# Patient Record
Sex: Male | Born: 1955 | Race: White | Hispanic: No | Marital: Single | Smoking: Current every day smoker
Health system: Southern US, Community
[De-identification: ages and names within clinical notes are randomized; demographics above are authoritative.]

## PROBLEM LIST (undated history)

## (undated) DIAGNOSIS — I10 Essential (primary) hypertension: Secondary | ICD-10-CM

## (undated) DIAGNOSIS — E119 Type 2 diabetes mellitus without complications: Secondary | ICD-10-CM

## (undated) DIAGNOSIS — T148XXA Other injury of unspecified body region, initial encounter: Secondary | ICD-10-CM

## (undated) DIAGNOSIS — L089 Local infection of the skin and subcutaneous tissue, unspecified: Secondary | ICD-10-CM

## (undated) DIAGNOSIS — Z720 Tobacco use: Secondary | ICD-10-CM

## (undated) DIAGNOSIS — A159 Respiratory tuberculosis unspecified: Secondary | ICD-10-CM

## (undated) DIAGNOSIS — E7800 Pure hypercholesterolemia, unspecified: Secondary | ICD-10-CM

## (undated) DIAGNOSIS — M1990 Unspecified osteoarthritis, unspecified site: Secondary | ICD-10-CM

## (undated) DIAGNOSIS — L03116 Cellulitis of left lower limb: Secondary | ICD-10-CM

## (undated) DIAGNOSIS — M869 Osteomyelitis, unspecified: Secondary | ICD-10-CM

---

## 2002-09-09 ENCOUNTER — Encounter: Payer: Self-pay | Admitting: Emergency Medicine

## 2002-09-09 ENCOUNTER — Inpatient Hospital Stay (HOSPITAL_COMMUNITY): Admission: EM | Admit: 2002-09-09 | Discharge: 2002-09-11 | Payer: Self-pay | Admitting: Emergency Medicine

## 2013-06-10 ENCOUNTER — Encounter (HOSPITAL_COMMUNITY): Payer: Self-pay | Admitting: *Deleted

## 2013-06-10 ENCOUNTER — Emergency Department (HOSPITAL_COMMUNITY)
Admission: EM | Admit: 2013-06-10 | Discharge: 2013-06-10 | Disposition: A | Discharge status: Home / Self Care | Payer: Self-pay | Attending: Emergency Medicine | Admitting: Emergency Medicine

## 2013-06-10 ENCOUNTER — Emergency Department (HOSPITAL_COMMUNITY): Payer: Self-pay

## 2013-06-10 DIAGNOSIS — F17200 Nicotine dependence, unspecified, uncomplicated: Secondary | ICD-10-CM | POA: Insufficient documentation

## 2013-06-10 DIAGNOSIS — R202 Paresthesia of skin: Secondary | ICD-10-CM

## 2013-06-10 DIAGNOSIS — R739 Hyperglycemia, unspecified: Secondary | ICD-10-CM

## 2013-06-10 DIAGNOSIS — R7309 Other abnormal glucose: Secondary | ICD-10-CM | POA: Insufficient documentation

## 2013-06-10 DIAGNOSIS — R7401 Elevation of levels of liver transaminase levels: Secondary | ICD-10-CM | POA: Insufficient documentation

## 2013-06-10 DIAGNOSIS — R209 Unspecified disturbances of skin sensation: Secondary | ICD-10-CM | POA: Insufficient documentation

## 2013-06-10 DIAGNOSIS — R7402 Elevation of levels of lactic acid dehydrogenase (LDH): Secondary | ICD-10-CM | POA: Insufficient documentation

## 2013-06-10 LAB — URINE MICROSCOPIC-ADD ON

## 2013-06-10 LAB — CBC WITH DIFFERENTIAL/PLATELET
Basophils Absolute: 0.1 10*3/uL (ref 0.0–0.1)
Basophils Relative: 1 % (ref 0–1)
Eosinophils Absolute: 0.1 10*3/uL (ref 0.0–0.7)
Eosinophils Relative: 1 % (ref 0–5)
HCT: 40.9 % (ref 39.0–52.0)
Hemoglobin: 14.4 g/dL (ref 13.0–17.0)
Lymphocytes Relative: 28 % (ref 12–46)
Lymphs Abs: 2.9 10*3/uL (ref 0.7–4.0)
MCH: 33.4 pg (ref 26.0–34.0)
MCHC: 35.2 g/dL (ref 30.0–36.0)
MCV: 94.9 fL (ref 78.0–100.0)
Monocytes Absolute: 0.9 10*3/uL (ref 0.1–1.0)
Monocytes Relative: 9 % (ref 3–12)
Neutro Abs: 6.3 10*3/uL (ref 1.7–7.7)
Neutrophils Relative %: 61 % (ref 43–77)
Platelets: 268 10*3/uL (ref 150–400)
RBC: 4.31 MIL/uL (ref 4.22–5.81)
RDW: 13.5 % (ref 11.5–15.5)
WBC: 10.4 10*3/uL (ref 4.0–10.5)

## 2013-06-10 LAB — COMPREHENSIVE METABOLIC PANEL
ALT: 44 U/L (ref 0–53)
AST: 142 U/L — ABNORMAL HIGH (ref 0–37)
Albumin: 3.5 g/dL (ref 3.5–5.2)
Alkaline Phosphatase: 72 U/L (ref 39–117)
BUN: 15 mg/dL (ref 6–23)
CO2: 25 mEq/L (ref 19–32)
Calcium: 9 mg/dL (ref 8.4–10.5)
Chloride: 101 mEq/L (ref 96–112)
Creatinine, Ser: 1.07 mg/dL (ref 0.50–1.35)
GFR calc Af Amer: 88 mL/min — ABNORMAL LOW (ref >90)
GFR calc non Af Amer: 76 mL/min — ABNORMAL LOW (ref >90)
Glucose, Bld: 156 mg/dL — ABNORMAL HIGH (ref 70–99)
Potassium: 3.5 mEq/L (ref 3.5–5.1)
Sodium: 138 mEq/L (ref 135–145)
Total Bilirubin: 0.3 mg/dL (ref 0.3–1.2)
Total Protein: 6.4 g/dL (ref 6.0–8.3)

## 2013-06-10 LAB — LIPASE, BLOOD: Lipase: 25 U/L (ref 11–59)

## 2013-06-10 LAB — URINALYSIS, ROUTINE W REFLEX MICROSCOPIC
Glucose, UA: NEGATIVE mg/dL
Ketones, ur: 15 mg/dL — AB
Nitrite: NEGATIVE
Protein, ur: NEGATIVE mg/dL

## 2013-06-10 LAB — POCT I-STAT TROPONIN I: Troponin i, poc: 0.01 ng/mL (ref 0.00–0.08)

## 2013-06-10 NOTE — ED Provider Notes (Signed)
CSN: [id number]     Arrival date & time 06/10/13  0011 History     First MD Initiated Contact with Patient 06/10/13 0244     Chief Complaint  Patient presents with  . Numbness   (Consider location/radiation/quality/duration/timing/severity/associated sxs/prior Treatment) The history is provided by the patient.  57 year-old male has had intermittent numbness of his left hand for the last 2 weeks. He is also did notice some numbness of both of his feet. He has not noticed any weakness. He denies hurting anywhere. He denies chest pain, heaviness, tightness, pressure. He denies neck pain but he states that his neck sometimes feels tight. There's been no nausea, vomiting, diarrhea. Nothing makes the numbness better nothing makes it worse. He has been having some mild, intermittent right upper quadrant pain for the last several years and this has not changed. He is a cigarette smoker admitting to his smoking to one pack of cigarettes a day. Note is made of a chair his head stating he has pain in his left arm but he denies this when I asked him specifically about it.  History reviewed. No pertinent past medical history. History reviewed. No pertinent past surgical history. No family history on file. History  Substance Use Topics  . Smoking status: Current Every Day Smoker  . Smokeless tobacco: Not on file  . Alcohol Use: Yes    Review of Systems  All other systems reviewed and are negative.    Allergies  Review of patient's allergies indicates no known allergies.  Home Medications  No current outpatient prescriptions on file. BP 107/69  Pulse 83  Temp(Src) 98.4 F (36.9 C)  Resp 16  SpO2 95% Physical Exam  Nursing note and vitals reviewed.  57 year old male, resting comfortably and in no acute distress. Vital signs are normal. Oxygen saturation is 95%, which is normal. Head is normocephalic and atraumatic. PERRLA, EOMI. Oropharynx is clear. Neck is nontender and supple without  adenopathy or JVD. Back is nontender and there is no CVA tenderness. Lungs are clear without rales, wheezes, or rhonchi. Chest is nontender. Heart has regular rate and rhythm without murmur. Abdomen is soft, flat, with mild right upper quadrant tenderness. There is no rebound or guarding. There is negative Murphy sign. There are no masses or hepatosplenomegaly and peristalsis is normoactive. Extremities have no cyanosis or edema, full range of motion is present. Skin is warm and dry without rash. Neurologic: Mental status is normal, cranial nerves are intact, there are no motor or sensory deficits.  ED Course   Procedures (including critical care time)  Results for orders placed during the hospital encounter of 06/10/13  CBC WITH DIFFERENTIAL      Result Value Range   WBC 10.4  4.0 - 10.5 K/uL   RBC 4.31  4.22 - 5.81 MIL/uL   Hemoglobin 14.4  13.0 - 17.0 g/dL   HCT 40.9  81.1 - 91.4 %   MCV 94.9  78.0 - 100.0 fL   MCH 33.4  26.0 - 34.0 pg   MCHC 35.2  30.0 - 36.0 g/dL   RDW 78.2  95.6 - 21.3 %   Platelets 268  150 - 400 K/uL   Neutrophils Relative % 61  43 - 77 %   Neutro Abs 6.3  1.7 - 7.7 K/uL   Lymphocytes Relative 28  12 - 46 %   Lymphs Abs 2.9  0.7 - 4.0 K/uL   Monocytes Relative 9  3 - 12 %  Monocytes Absolute 0.9  0.1 - 1.0 K/uL   Eosinophils Relative 1  0 - 5 %   Eosinophils Absolute 0.1  0.0 - 0.7 K/uL   Basophils Relative 1  0 - 1 %   Basophils Absolute 0.1  0.0 - 0.1 K/uL  COMPREHENSIVE METABOLIC PANEL      Result Value Range   Sodium 138  135 - 145 mEq/L   Potassium 3.5  3.5 - 5.1 mEq/L   Chloride 101  96 - 112 mEq/L   CO2 25  19 - 32 mEq/L   Glucose, Bld 156 (*) 70 - 99 mg/dL   BUN 15  6 - 23 mg/dL   Creatinine, Ser 2.95  0.50 - 1.35 mg/dL   Calcium 9.0  8.4 - 62.1 mg/dL   Total Protein 6.4  6.0 - 8.3 g/dL   Albumin 3.5  3.5 - 5.2 g/dL   AST 308 (*) 0 - 37 U/L   ALT 44  0 - 53 U/L   Alkaline Phosphatase 72  39 - 117 U/L   Total Bilirubin 0.3  0.3 - 1.2  mg/dL   GFR calc non Af Amer 76 (*) >90 mL/min   GFR calc Af Amer 88 (*) >90 mL/min  LIPASE, BLOOD      Result Value Range   Lipase 25  11 - 59 U/L  URINALYSIS, ROUTINE W REFLEX MICROSCOPIC      Result Value Range   Color, Urine YELLOW  YELLOW   APPearance CLOUDY (*) CLEAR   Specific Gravity, Urine 1.016  1.005 - 1.030   pH 5.5  5.0 - 8.0   Glucose, UA NEGATIVE  NEGATIVE mg/dL   Hgb urine dipstick LARGE (*) NEGATIVE   Bilirubin Urine NEGATIVE  NEGATIVE   Ketones, ur 15 (*) NEGATIVE mg/dL   Protein, ur NEGATIVE  NEGATIVE mg/dL   Urobilinogen, UA 0.2  0.0 - 1.0 mg/dL   Nitrite NEGATIVE  NEGATIVE   Leukocytes, UA MODERATE (*) NEGATIVE  URINE MICROSCOPIC-ADD ON      Result Value Range   Squamous Epithelial / LPF RARE  RARE   WBC, UA 7-10  <3 WBC/hpf   RBC / HPF 21-50  <3 RBC/hpf   Bacteria, UA RARE  RARE   Urine-Other MUCOUS PRESENT    POCT I-STAT TROPONIN I      Result Value Range   Troponin i, poc 0.01  0.00 - 0.08 ng/mL   Comment 3            Dg Chest 2 View  06/10/2013   *RADIOLOGY REPORT*  Clinical Data: 2-week history of numbness and left arm  CHEST - 2 VIEW  Comparison: None  Findings: Pulmonary hyperexpansion. Chronic appearing bronchitic changes. Negative for edema, focal airspace consolidation.  Cardiac and mediastinal contours within normal limits.  No pleural effusion or pneumothorax.  No suspicious pulmonary nodule. No acute osseous abnormality.  IMPRESSION:  1.  No acute cardiopulmonary process. 2.  Pulmonary hyperexpansion and coarse central bronchitic changes suggest COPD.   Original Report Authenticated By: Malachy Moan, M.D.     Date: 06/10/2013  Rate: 89  Rhythm: normal sinus rhythm  QRS Axis: left  Intervals: normal  ST/T Wave abnormalities: nonspecific ST changes  Conduction Disutrbances:left anterior fascicular block  Narrative Interpretation: Left anterior fascicular block, nonspecific ST changes, left atrial hypertrophy. No prior ECG available for  comparison.  Old EKG Reviewed: none available   1. Numbness and tingling in left hand   2. Hyperglycemia  3. Elevated transaminase level     MDM  Numbness of uncertain cause. No sign of stroke. No evidence of radiculopathy. Screening labs will be obtained.  Blood sugar has come back mildly elevated at 156, and AST is also elevated, probably related to alcohol use. He is advised of the need to get set up with PCP and he is referred to the [place] [facility] and given resource guide.  Dione Booze, MD 06/10/13 [phone]

## 2013-06-10 NOTE — ED Notes (Addendum)
Patient placed in transport @ [phone] At 0626, CT angio cancelled by Dr. Preston Fleeting. Transport also cancelled.

## 2013-06-10 NOTE — ED Notes (Signed)
Unable to locate patient from lobby x1 when calling for room

## 2013-06-10 NOTE — ED Notes (Signed)
The pt is c/o lt arm pain from his wrist into his lt upper arm for 2  Weeks.  He is also c/o bi-lateral leg numbness  For months and he is also c/o rt upper abd pain

## 2013-06-10 NOTE — ED Notes (Addendum)
0200  Pt ambulatory to the room with left arm pain bilateral leg pain and abdominal pain.  Pt is A&O at this time and will continue to monitor  0300  Pt relaxing at this time will continue to monitor  0400  Pt is asleep at this time  0500  Pt is still sleeping at this time no change in status  0600  Pt still asleep

## 2013-10-20 ENCOUNTER — Encounter (HOSPITAL_COMMUNITY): Payer: Self-pay | Admitting: Emergency Medicine

## 2013-10-20 ENCOUNTER — Emergency Department (HOSPITAL_COMMUNITY)
Admission: EM | Admit: 2013-10-20 | Discharge: 2013-10-21 | Disposition: A | Discharge status: Home / Self Care | Payer: Self-pay | Attending: Emergency Medicine | Admitting: Emergency Medicine

## 2013-10-20 ENCOUNTER — Other Ambulatory Visit: Payer: Self-pay

## 2013-10-20 ENCOUNTER — Emergency Department (HOSPITAL_COMMUNITY): Payer: Self-pay

## 2013-10-20 DIAGNOSIS — R42 Dizziness and giddiness: Secondary | ICD-10-CM | POA: Insufficient documentation

## 2013-10-20 DIAGNOSIS — F17200 Nicotine dependence, unspecified, uncomplicated: Secondary | ICD-10-CM | POA: Insufficient documentation

## 2013-10-20 DIAGNOSIS — R5381 Other malaise: Secondary | ICD-10-CM | POA: Insufficient documentation

## 2013-10-20 DIAGNOSIS — R071 Chest pain on breathing: Secondary | ICD-10-CM | POA: Insufficient documentation

## 2013-10-20 DIAGNOSIS — R0789 Other chest pain: Secondary | ICD-10-CM

## 2013-10-20 DIAGNOSIS — R630 Anorexia: Secondary | ICD-10-CM | POA: Insufficient documentation

## 2013-10-20 DIAGNOSIS — F1010 Alcohol abuse, uncomplicated: Secondary | ICD-10-CM

## 2013-10-20 DIAGNOSIS — J40 Bronchitis, not specified as acute or chronic: Secondary | ICD-10-CM

## 2013-10-20 LAB — COMPREHENSIVE METABOLIC PANEL
ALT: 35 U/L (ref 0–53)
Alkaline Phosphatase: 130 U/L — ABNORMAL HIGH (ref 39–117)
BUN: 6 mg/dL (ref 6–23)
CO2: 23 mEq/L (ref 19–32)
Chloride: 95 mEq/L — ABNORMAL LOW (ref 96–112)
GFR calc Af Amer: >90 mL/min (ref >90)
Glucose, Bld: 140 mg/dL — ABNORMAL HIGH (ref 70–99)
Potassium: 3.5 mEq/L (ref 3.5–5.1)
Sodium: 136 mEq/L (ref 135–145)
Total Bilirubin: 1.1 mg/dL (ref 0.3–1.2)
Total Protein: 7.3 g/dL (ref 6.0–8.3)

## 2013-10-20 LAB — CBC
HCT: 41.3 % (ref 39.0–52.0)
Hemoglobin: 14.8 g/dL (ref 13.0–17.0)
MCV: 95.8 fL (ref 78.0–100.0)
RBC: 4.31 MIL/uL (ref 4.22–5.81)
WBC: 12.9 10*3/uL — ABNORMAL HIGH (ref 4.0–10.5)

## 2013-10-20 LAB — POCT I-STAT TROPONIN I: Troponin i, poc: 0.01 ng/mL (ref 0.00–0.08)

## 2013-10-20 MED ORDER — LORAZEPAM 2 MG/ML IJ SOLN
1.0000 mg | Freq: Once | INTRAMUSCULAR | Status: AC
  Administered 2013-10-21: 1 mg via INTRAVENOUS
  Filled 2013-10-20: qty 1

## 2013-10-20 MED ORDER — ONDANSETRON HCL 4 MG/2ML IJ SOLN
4.0000 mg | Freq: Once | INTRAMUSCULAR | Status: AC
  Administered 2013-10-21: 4 mg via INTRAVENOUS
  Filled 2013-10-20: qty 2

## 2013-10-20 MED ORDER — SODIUM CHLORIDE 0.9 % IV BOLUS (SEPSIS)
1000.0000 mL | Freq: Once | INTRAVENOUS | Status: AC
  Administered 2013-10-21: 1000 mL via INTRAVENOUS

## 2013-10-20 MED ORDER — THIAMINE HCL 100 MG/ML IJ SOLN
100.0000 mg | Freq: Once | INTRAMUSCULAR | Status: AC
  Administered 2013-10-21: 100 mg via INTRAVENOUS
  Filled 2013-10-20: qty 2

## 2013-10-20 NOTE — ED Notes (Signed)
Pt. reports left chest pain and right lower back pain for several years , slight SOB , occasional dry cough and generalized fatigue .

## 2013-10-20 NOTE — ED Provider Notes (Signed)
CSN: [id number]     Arrival date & time 10/20/13  2102 History   First MD Initiated Contact with Patient 10/20/13 2352     Chief Complaint  Patient presents with  . Chest Pain   (Consider location/radiation/quality/duration/timing/severity/associated sxs/prior Treatment) HPI Patient is a 57 yo man who does not receive regular medical care but reports no PMH.   He presents with complaints of chest pain with lightheadedness for the past two days. He says he has had similar chest pain since 2007. He has had a diminished appetite and po intake.   Patient is noted be tremulous and states that he has ingested two fifths of vodka in the past 48 hrs and last drink was last night. He drinks "lots" of beer of a regular basis.   Patient says CP is burning, diffuse. He has a mild, non-productive cough. He is a 1ppd smoker and notes some mild chronic DOE which is unchanged. He reports generalized fatigue. No fever.    History reviewed. No pertinent past medical history. History reviewed. No pertinent past surgical history. No family history on file. History  Substance Use Topics  . Smoking status: Current Every Day Smoker  . Smokeless tobacco: Not on file  . Alcohol Use: Yes    Review of Systems  Ten point review of symptoms performed and is negative with the exception of symptoms noted above.   Allergies  Review of patient's allergies indicates no known allergies.  Home Medications  No current outpatient prescriptions on file. BP 195/107  Pulse 122  Temp(Src) 97 F (36.1 C) (Oral)  Resp 18  SpO2 96% Physical Exam Gen: well developed and well nourished appearing Head: NCAT Eyes: PERL, EOMI Nose: no epistaixis or rhinorrhea Mouth/throat: mucosa is moist and pink Neck: supple, no stridor Lungs: Respiratory rate 20 per minute, scattered wheezing, good air exchange no rhonchi or rales CV: Rapid and regular, pulse 108, no murmur, extremities appear well perfused. Abd: soft,  notender, nondistended Back: no ttp, no cva ttp Skin: warm and dry Ext: normal to inspection, no dependent edema Neuro: CN ii-xii grossly intact, no focal deficits Psyche; normal affect,  calm and cooperative.   ED Course  Procedures (including critical care time) Labs Review  Results for orders placed during the hospital encounter of 10/20/13 (from the past 24 hour(s))  CBC     Status: Abnormal   Collection Time    10/20/13  9:23 PM      Result Value Range   WBC 12.9 (*) 4.0 - 10.5 K/uL   RBC 4.31  4.22 - 5.81 MIL/uL   Hemoglobin 14.8  13.0 - 17.0 g/dL   HCT 52.8  41.3 - 24.4 %   MCV 95.8  78.0 - 100.0 fL   MCH 34.3 (*) 26.0 - 34.0 pg   MCHC 35.8  30.0 - 36.0 g/dL   RDW 01.0  27.2 - 53.6 %   Platelets 229  150 - 400 K/uL  PRO B NATRIURETIC PEPTIDE     Status: None   Collection Time    10/20/13  9:23 PM      Result Value Range   Pro B Natriuretic peptide (BNP) 43.5  0 - 125 pg/mL  COMPREHENSIVE METABOLIC PANEL     Status: Abnormal   Collection Time    10/20/13  9:23 PM      Result Value Range   Sodium 136  135 - 145 mEq/L   Potassium 3.5  3.5 - 5.1 mEq/L   Chloride  95 (*) 96 - 112 mEq/L   CO2 23  19 - 32 mEq/L   Glucose, Bld 140 (*) 70 - 99 mg/dL   BUN 6  6 - 23 mg/dL   Creatinine, Ser 4.09  0.50 - 1.35 mg/dL   Calcium 8.9  8.4 - 81.1 mg/dL   Total Protein 7.3  6.0 - 8.3 g/dL   Albumin 4.1  3.5 - 5.2 g/dL   AST 914 (*) 0 - 37 U/L   ALT 35  0 - 53 U/L   Alkaline Phosphatase 130 (*) 39 - 117 U/L   Total Bilirubin 1.1  0.3 - 1.2 mg/dL   GFR calc non Af Amer >90  >90 mL/min   GFR calc Af Amer >90  >90 mL/min  POCT I-STAT TROPONIN I     Status: None   Collection Time    10/20/13  9:34 PM      Result Value Range   Troponin i, poc 0.01  0.00 - 0.08 ng/mL   Comment 3            Imaging Review Dg Chest 2 View  10/20/2013   CLINICAL DATA:  Chest pain, history of tobacco use possible tuberculous history or exposure  EXAM: CHEST  2 VIEW  COMPARISON:  June 10, 2013.   FINDINGS: The lungs are hyperinflated with hemidiaphragm flattening. There is no focal infiltrate. The cardiopericardial silhouette is normal in size. The pulmonary vascularity is not engorged. No calcified pulmonary parenchymal lesions or mediastinal nodules are demonstrated. There is no evidence of cavitation. There is no pleural effusion or pneumothorax. The mediastinum is normal in width. The observed portions of the bony thorax are normal.  IMPRESSION: There is hyperinflation consistent with COPD. There is no evidence of pneumonia nor CHF or other acute cardiopulmonary abnormality.   Electronically Signed   By: David  [place]   On: 10/20/2013 22:06      MDM   Patient with acute bronchitis and alcohol withdrawal sx. Tx with steroids and nebs for bronchitis with resolution of wheezing and feeling better. No respiratory distress. Tx with Ativan and thiamine and IVF for alcohol withdrawal. Patient declines inpatient tx for alcoholism.  He is stable for d/c with referral to the [facility] for outpatient f/u and return precautions.    Brandt Loosen, MD 10/21/13 [phone]

## 2013-10-21 MED ORDER — ALBUTEROL SULFATE (5 MG/ML) 0.5% IN NEBU
5.0000 mg | INHALATION_SOLUTION | Freq: Once | RESPIRATORY_TRACT | Status: AC
  Administered 2013-10-21: 5 mg via RESPIRATORY_TRACT
  Filled 2013-10-21: qty 1

## 2013-10-21 MED ORDER — LORAZEPAM 1 MG PO TABS: 1.0000 mg | ORAL_TABLET | Freq: Three times a day (TID) | ORAL | Status: DC | PRN

## 2013-10-21 MED ORDER — PREDNISONE 20 MG PO TABS
60.0000 mg | ORAL_TABLET | Freq: Once | ORAL | Status: AC
  Administered 2013-10-21: 60 mg via ORAL
  Filled 2013-10-21: qty 3

## 2013-10-21 MED ORDER — GUAIFENESIN-DM 100-10 MG/5ML PO SYRP: 5.0000 mL | ORAL_SOLUTION | ORAL | Status: DC | PRN

## 2013-10-21 MED ORDER — IPRATROPIUM BROMIDE 0.02 % IN SOLN
0.5000 mg | Freq: Once | RESPIRATORY_TRACT | Status: AC
  Administered 2013-10-21: 0.5 mg via RESPIRATORY_TRACT
  Filled 2013-10-21: qty 2.5

## 2013-10-21 MED ORDER — PREDNISONE 20 MG PO TABS: ORAL_TABLET | ORAL | Status: DC

## 2013-10-21 MED ORDER — ALBUTEROL SULFATE HFA 108 (90 BASE) MCG/ACT IN AERS
2.0000 | INHALATION_SPRAY | Freq: Once | RESPIRATORY_TRACT | Status: AC
  Administered 2013-10-21: 2 via RESPIRATORY_TRACT

## 2013-10-21 MED ORDER — ALBUTEROL SULFATE HFA 108 (90 BASE) MCG/ACT IN AERS
INHALATION_SPRAY | RESPIRATORY_TRACT | Status: AC
  Administered 2013-10-21: 2 via RESPIRATORY_TRACT
  Filled 2013-10-21: qty 6.7

## 2013-10-21 NOTE — ED Notes (Signed)
Pt with vague complaints, L chest rib pain, "worse with movement & cough", onset 2007, (denies: fever, nvd, constipation, sob), states, "stomach is messed up, but unable to describe", last BM 2d ago, ("small amount, not hard not diarrhea"), last ate 2d ago, last ETOH 12/24 & 12/25, "[place]".

## 2013-10-21 NOTE — ED Notes (Signed)
Inhaler with spacer demonstrated/ taught. Directions to [facility] explained and mapped out. Happy meal given, Rx x3 given, VSS.

## 2014-03-02 ENCOUNTER — Encounter (HOSPITAL_COMMUNITY): Payer: Self-pay | Admitting: Emergency Medicine

## 2014-03-02 ENCOUNTER — Emergency Department (HOSPITAL_COMMUNITY): Payer: Self-pay

## 2014-03-02 DIAGNOSIS — E871 Hypo-osmolality and hyponatremia: Secondary | ICD-10-CM | POA: Insufficient documentation

## 2014-03-02 DIAGNOSIS — J449 Chronic obstructive pulmonary disease, unspecified: Secondary | ICD-10-CM | POA: Insufficient documentation

## 2014-03-02 DIAGNOSIS — J4489 Other specified chronic obstructive pulmonary disease: Secondary | ICD-10-CM | POA: Insufficient documentation

## 2014-03-02 DIAGNOSIS — D72829 Elevated white blood cell count, unspecified: Secondary | ICD-10-CM | POA: Insufficient documentation

## 2014-03-02 DIAGNOSIS — F17200 Nicotine dependence, unspecified, uncomplicated: Secondary | ICD-10-CM | POA: Insufficient documentation

## 2014-03-02 DIAGNOSIS — R0602 Shortness of breath: Secondary | ICD-10-CM | POA: Insufficient documentation

## 2014-03-02 DIAGNOSIS — R339 Retention of urine, unspecified: Secondary | ICD-10-CM | POA: Insufficient documentation

## 2014-03-02 DIAGNOSIS — R300 Dysuria: Secondary | ICD-10-CM | POA: Insufficient documentation

## 2014-03-02 DIAGNOSIS — I10 Essential (primary) hypertension: Secondary | ICD-10-CM | POA: Insufficient documentation

## 2014-03-02 DIAGNOSIS — R7309 Other abnormal glucose: Secondary | ICD-10-CM | POA: Insufficient documentation

## 2014-03-02 DIAGNOSIS — R079 Chest pain, unspecified: Principal | ICD-10-CM | POA: Insufficient documentation

## 2014-03-02 DIAGNOSIS — F1010 Alcohol abuse, uncomplicated: Secondary | ICD-10-CM | POA: Insufficient documentation

## 2014-03-02 LAB — BASIC METABOLIC PANEL
BUN: 9 mg/dL (ref 6–23)
CHLORIDE: 91 meq/L — AB (ref 96–112)
CO2: 24 mEq/L (ref 19–32)
Calcium: 9.5 mg/dL (ref 8.4–10.5)
Creatinine, Ser: 0.8 mg/dL (ref 0.50–1.35)
GFR calc non Af Amer: >90 mL/min (ref >90)
Glucose, Bld: 162 mg/dL — ABNORMAL HIGH (ref 70–99)
Potassium: 4.3 mEq/L (ref 3.7–5.3)
SODIUM: 131 meq/L — AB (ref 137–147)

## 2014-03-02 LAB — CBC
HCT: 42.7 % (ref 39.0–52.0)
Hemoglobin: 15.3 g/dL (ref 13.0–17.0)
MCH: 33.3 pg (ref 26.0–34.0)
MCHC: 35.8 g/dL (ref 30.0–36.0)
MCV: 92.8 fL (ref 78.0–100.0)
PLATELETS: 269 10*3/uL (ref 150–400)
RBC: 4.6 MIL/uL (ref 4.22–5.81)
RDW: 13.1 % (ref 11.5–15.5)
WBC: 13.2 10*3/uL — AB (ref 4.0–10.5)

## 2014-03-02 LAB — I-STAT TROPONIN, ED: TROPONIN I, POC: 0 ng/mL (ref 0.00–0.08)

## 2014-03-02 NOTE — ED Notes (Signed)
Pt states he was having central chest pain, but is currently complaint free.

## 2014-03-03 ENCOUNTER — Encounter (HOSPITAL_COMMUNITY): Payer: Self-pay | Admitting: Internal Medicine

## 2014-03-03 ENCOUNTER — Observation Stay (HOSPITAL_COMMUNITY)
Admission: EM | Admit: 2014-03-03 | Discharge: 2014-03-04 | Disposition: A | Discharge status: Home / Self Care | Payer: Self-pay | Attending: Internal Medicine | Admitting: Internal Medicine

## 2014-03-03 DIAGNOSIS — R339 Retention of urine, unspecified: Secondary | ICD-10-CM | POA: Diagnosis present

## 2014-03-03 DIAGNOSIS — I10 Essential (primary) hypertension: Secondary | ICD-10-CM

## 2014-03-03 DIAGNOSIS — D72829 Elevated white blood cell count, unspecified: Secondary | ICD-10-CM | POA: Diagnosis present

## 2014-03-03 DIAGNOSIS — R079 Chest pain, unspecified: Secondary | ICD-10-CM | POA: Diagnosis present

## 2014-03-03 DIAGNOSIS — R252 Cramp and spasm: Secondary | ICD-10-CM

## 2014-03-03 DIAGNOSIS — Z720 Tobacco use: Secondary | ICD-10-CM

## 2014-03-03 DIAGNOSIS — R300 Dysuria: Secondary | ICD-10-CM

## 2014-03-03 DIAGNOSIS — E871 Hypo-osmolality and hyponatremia: Secondary | ICD-10-CM | POA: Diagnosis present

## 2014-03-03 LAB — CBC
HEMATOCRIT: 39.4 % (ref 39.0–52.0)
Hemoglobin: 13.8 g/dL (ref 13.0–17.0)
MCH: 33.1 pg (ref 26.0–34.0)
MCHC: 35 g/dL (ref 30.0–36.0)
MCV: 94.5 fL (ref 78.0–100.0)
Platelets: 223 10*3/uL (ref 150–400)
RBC: 4.17 MIL/uL — ABNORMAL LOW (ref 4.22–5.81)
RDW: 13.1 % (ref 11.5–15.5)
WBC: 8.8 10*3/uL (ref 4.0–10.5)

## 2014-03-03 LAB — LIPID PANEL
CHOLESTEROL: 150 mg/dL (ref 0–200)
HDL: 50 mg/dL (ref >39)
LDL Cholesterol: 79 mg/dL (ref 0–99)
TRIGLYCERIDES: 103 mg/dL (ref <150)
Total CHOL/HDL Ratio: 3 RATIO
VLDL: 21 mg/dL (ref 0–40)

## 2014-03-03 LAB — OSMOLALITY, URINE: Osmolality, Ur: 206 mOsm/kg — ABNORMAL LOW (ref 390–1090)

## 2014-03-03 LAB — URINALYSIS, ROUTINE W REFLEX MICROSCOPIC
BILIRUBIN URINE: NEGATIVE
Glucose, UA: NEGATIVE mg/dL
Ketones, ur: NEGATIVE mg/dL
NITRITE: NEGATIVE
PROTEIN: NEGATIVE mg/dL
Specific Gravity, Urine: 1.011 (ref 1.005–1.030)
UROBILINOGEN UA: 0.2 mg/dL (ref 0.0–1.0)
pH: 6 (ref 5.0–8.0)

## 2014-03-03 LAB — RAPID URINE DRUG SCREEN, HOSP PERFORMED
Amphetamines: NOT DETECTED
Barbiturates: NOT DETECTED
Benzodiazepines: NOT DETECTED
COCAINE: NOT DETECTED
OPIATES: NOT DETECTED
TETRAHYDROCANNABINOL: NOT DETECTED

## 2014-03-03 LAB — TROPONIN I
Troponin I: <0.3 ng/mL (ref <0.30)
Troponin I: <0.3 ng/mL (ref <0.30)

## 2014-03-03 LAB — SODIUM, URINE, RANDOM

## 2014-03-03 LAB — URINE MICROSCOPIC-ADD ON

## 2014-03-03 LAB — CREATININE, SERUM
Creatinine, Ser: 0.84 mg/dL (ref 0.50–1.35)
GFR calc Af Amer: >90 mL/min (ref >90)
GFR calc non Af Amer: >90 mL/min (ref >90)

## 2014-03-03 LAB — HEPATIC FUNCTION PANEL
ALBUMIN: 4 g/dL (ref 3.5–5.2)
ALT: 16 U/L (ref 0–53)
AST: 38 U/L — ABNORMAL HIGH (ref 0–37)
Alkaline Phosphatase: 117 U/L (ref 39–117)
BILIRUBIN TOTAL: 0.9 mg/dL (ref 0.3–1.2)
Bilirubin, Direct: <0.2 mg/dL (ref 0.0–0.3)
Total Protein: 6.8 g/dL (ref 6.0–8.3)

## 2014-03-03 LAB — PROTIME-INR
INR: 0.98 (ref 0.00–1.49)
Prothrombin Time: 12.8 seconds (ref 11.6–15.2)

## 2014-03-03 LAB — CREATININE, URINE, RANDOM: CREATININE, URINE: 83.72 mg/dL

## 2014-03-03 MED ORDER — ONDANSETRON HCL 4 MG PO TABS: 4.0000 mg | ORAL_TABLET | Freq: Four times a day (QID) | ORAL | Status: DC | PRN

## 2014-03-03 MED ORDER — ASPIRIN 81 MG PO CHEW
324.0000 mg | CHEWABLE_TABLET | Freq: Once | ORAL | Status: AC
  Administered 2014-03-03: 324 mg via ORAL
  Filled 2014-03-03: qty 4

## 2014-03-03 MED ORDER — KETOROLAC TROMETHAMINE 30 MG/ML IJ SOLN
30.0000 mg | Freq: Once | INTRAMUSCULAR | Status: AC
  Administered 2014-03-03: 30 mg via INTRAVENOUS
  Filled 2014-03-03: qty 1

## 2014-03-03 MED ORDER — ACETAMINOPHEN 325 MG PO TABS: 650.0000 mg | ORAL_TABLET | Freq: Four times a day (QID) | ORAL | Status: DC | PRN

## 2014-03-03 MED ORDER — HYDROCODONE-ACETAMINOPHEN 5-325 MG PO TABS
1.0000 | ORAL_TABLET | ORAL | Status: DC | PRN
  Administered 2014-03-03: 1 via ORAL
  Filled 2014-03-03: qty 1

## 2014-03-03 MED ORDER — ASPIRIN EC 81 MG PO TBEC
81.0000 mg | DELAYED_RELEASE_TABLET | Freq: Every day | ORAL | Status: DC
  Filled 2014-03-03 (×2): qty 1

## 2014-03-03 MED ORDER — SODIUM CHLORIDE 0.9 % IV SOLN
INTRAVENOUS | Status: DC
  Administered 2014-03-03: 07:00:00 via INTRAVENOUS

## 2014-03-03 MED ORDER — SODIUM CHLORIDE 0.9 % IJ SOLN
3.0000 mL | Freq: Two times a day (BID) | INTRAMUSCULAR | Status: DC
  Administered 2014-03-03: 3 mL via INTRAVENOUS

## 2014-03-03 MED ORDER — ONDANSETRON HCL 4 MG/2ML IJ SOLN: 4.0000 mg | Freq: Four times a day (QID) | INTRAMUSCULAR | Status: DC | PRN

## 2014-03-03 MED ORDER — ACETAMINOPHEN 650 MG RE SUPP: 650.0000 mg | Freq: Four times a day (QID) | RECTAL | Status: DC | PRN

## 2014-03-03 MED ORDER — TAMSULOSIN HCL 0.4 MG PO CAPS
0.4000 mg | ORAL_CAPSULE | Freq: Every day | ORAL | Status: DC
  Administered 2014-03-03: 0.4 mg via ORAL
  Filled 2014-03-03 (×2): qty 1

## 2014-03-03 MED ORDER — CLONIDINE HCL 0.1 MG PO TABS
0.1000 mg | ORAL_TABLET | Freq: Two times a day (BID) | ORAL | Status: DC
  Administered 2014-03-03 (×2): 0.1 mg via ORAL
  Filled 2014-03-03 (×4): qty 1

## 2014-03-03 MED ORDER — FOLIC ACID 1 MG PO TABS
1.0000 mg | ORAL_TABLET | Freq: Every day | ORAL | Status: DC
Start: 2014-03-03 — End: 2014-03-04
  Administered 2014-03-03: 1 mg via ORAL
  Filled 2014-03-03 (×2): qty 1

## 2014-03-03 MED ORDER — NICOTINE 14 MG/24HR TD PT24
14.0000 mg | MEDICATED_PATCH | Freq: Every day | TRANSDERMAL | Status: DC
  Administered 2014-03-03: 14 mg via TRANSDERMAL
  Filled 2014-03-03 (×2): qty 1

## 2014-03-03 MED ORDER — ENOXAPARIN SODIUM 40 MG/0.4ML ~~LOC~~ SOLN
40.0000 mg | SUBCUTANEOUS | Status: DC
Start: 2014-03-03 — End: 2014-03-04
  Administered 2014-03-03: 40 mg via SUBCUTANEOUS
  Filled 2014-03-03 (×2): qty 0.4

## 2014-03-03 MED ORDER — DOCUSATE SODIUM 100 MG PO CAPS
100.0000 mg | ORAL_CAPSULE | Freq: Two times a day (BID) | ORAL | Status: DC
  Filled 2014-03-03 (×3): qty 1

## 2014-03-03 MED ORDER — VITAMIN B-1 100 MG PO TABS
100.0000 mg | ORAL_TABLET | Freq: Every day | ORAL | Status: DC
  Administered 2014-03-03: 100 mg via ORAL
  Filled 2014-03-03 (×2): qty 1

## 2014-03-03 MED ORDER — ENALAPRIL MALEATE 5 MG PO TABS
5.0000 mg | ORAL_TABLET | Freq: Every day | ORAL | Status: DC
  Administered 2014-03-03: 5 mg via ORAL
  Filled 2014-03-03: qty 1

## 2014-03-03 NOTE — ED Provider Notes (Signed)
CSN: [id number]     Arrival date & time 03/02/14  2240 History   First MD Initiated Contact with Patient 03/03/14 0405     Chief Complaint  Patient presents with  . Chest Pain     (Consider location/radiation/quality/duration/timing/severity/associated sxs/prior Treatment) Patient is a 58 y.o. male presenting with chest pain. The history is provided by the patient.  Chest Pain He is a very base of historian, but apparently had an episode of chest heaviness and tightness which began about 6 PM and resolved at about 10 PM. It did not seem to be affected by exertion or body position. There is associated dyspnea and diaphoresis but no nausea. He rated the pain at 6/10. He does have cardiac risk factor of tobacco abuse. He has not seen a physician so does not know if he has histories of diabetes or high blood pressure or hyperlipidemia. He also states that he has been urinating frequently through the day with urinary or urgency and dysuria. He is also complaining of cramping in his feet. He had been seen once before for chest pain and was referred to a clinic but never followed up.  History reviewed. No pertinent past medical history. History reviewed. No pertinent past surgical history. No family history on file. History  Substance Use Topics  . Smoking status: Current Every Day Smoker    Types: Cigarettes  . Smokeless tobacco: Not on file  . Alcohol Use: Yes    Review of Systems  Cardiovascular: Positive for chest pain.  All other systems reviewed and are negative.     Allergies  Review of patient's allergies indicates no known allergies.  Home Medications   Prior to Admission medications   Not on File   BP 182/110  Pulse 113  Temp(Src) 98.3 F (36.8 C) (Oral)  Resp 18  SpO2 98% Physical Exam  Nursing note and vitals reviewed.  58 year old male, resting comfortably and in no acute distress. Vital signs are significant for tachycardia with heart rate 113, and hypertension  with blood pressure 182/110. Oxygen saturation is 98%, which is normal. Head is normocephalic and atraumatic. PERRLA, EOMI. Oropharynx is clear. Neck is nontender and supple without adenopathy or JVD. Back is nontender and there is no CVA tenderness. Lungs are clear without rales, wheezes, or rhonchi. Chest is nontender. Heart has regular rate and rhythm without murmur. Abdomen is soft, flat, nontender without masses or hepatosplenomegaly and peristalsis is normoactive. Extremities have no cyanosis or edema, full range of motion is present. Skin is warm and dry without rash. Neurologic: Mental status is normal, cranial nerves are intact, there are no motor or sensory deficits.  ED Course  Procedures (including critical care time) Labs Review Results for orders placed during the hospital encounter of 03/03/14  CBC      Result Value Ref Range   WBC 13.2 (*) 4.0 - 10.5 K/uL   RBC 4.60  4.22 - 5.81 MIL/uL   Hemoglobin 15.3  13.0 - 17.0 g/dL   HCT 40.942.7  81.139.0 - 91.452.0 %   MCV 92.8  78.0 - 100.0 fL   MCH 33.3  26.0 - 34.0 pg   MCHC 35.8  30.0 - 36.0 g/dL   RDW 78.213.1  95.611.5 - 21.315.5 %   Platelets 269  150 - 400 K/uL  BASIC METABOLIC PANEL      Result Value Ref Range   Sodium 131 (*) 137 - 147 mEq/L   Potassium 4.3  3.7 - 5.3 mEq/L  Chloride 91 (*) 96 - 112 mEq/L   CO2 24  19 - 32 mEq/L   Glucose, Bld 162 (*) 70 - 99 mg/dL   BUN 9  6 - 23 mg/dL   Creatinine, Ser 1.610.80  0.50 - 1.35 mg/dL   Calcium 9.5  8.4 - 09.610.5 mg/dL   GFR calc non Af Amer >90  >90 mL/min   GFR calc Af Amer >90  >90 mL/min  I-STAT TROPOININ, ED      Result Value Ref Range   Troponin i, poc 0.00  0.00 - 0.08 ng/mL   Comment 3            Imaging Review Dg Chest 2 View  03/03/2014   CLINICAL DATA:  Chest pain.  History of TB.  EXAM: CHEST  2 VIEW  COMPARISON:  10/20/2013  FINDINGS: Pulmonary hyperinflation. No edema, consolidation, effusion, or pneumothorax. Normal heart size and mediastinal contours. An apparent  ill-defined nodular density at the right upper chest is considered summation of shadows. Additionally, this region is clear on recent comparison.  IMPRESSION: COPD without superimposed acute disease.   Electronically Signed   By: Tiburcio PeaJonathan  Watts M.D.   On: 03/03/2014 00:30     Date: 03/03/2014  Rate: 107  Rhythm: sinus tachycardia  QRS Axis: left  Intervals: normal  ST/T Wave abnormalities: normal  Conduction Disutrbances:left anterior fascicular block  Narrative Interpretation: Sinus tachycardia, left anterior fascicular block. When compared with ECG of 10/20/2013, no significant changes are seen.  Old EKG Reviewed: unchanged   MDM   Final diagnoses:  Chest pain  Hyponatremia  Dysuria  Foot cramps  Tobacco abuse    Chest pain which may be cardiac in origin. ECG is unchanged from baseline and initial troponin is negative but was drawn shortly after his pain stopped. Old records are reviewed and he had an ED visit in December for chest pain and was also seen last August. At that December visit, he had a tachycardia and hypertension but blood pressure was normal and heart rate was normal in August. I am concerned that he has not taking initiative to followup for his complaints. He had been given resource guide but it has made no attempt to find a physician or clinic. Troponin will be repeated and arrangements will be made to get the patient to continue to cycle troponins and consider cardiology consultation for stress testing. Case is discussed with Dr. Adela Glimpseoutova agrees to under observation status. Also, he is noted to have mild hyperglycemia. Dr. Adela Glimpseoutova requests INR be obtained as well as a urine drug screen and these are ordered. He was given a dose of ketorolac for pain and was given oral aspirin.  Dione Boozeavid Nyjah Denio, MD 03/03/14 [phone]

## 2014-03-03 NOTE — Progress Notes (Addendum)
Patient admitted after midnight. Chart reviewed. Patient examined. No further chest pain. MI ruled out. Echocardiogram pending. Blood pressure still elevated. No evidence of alcohol withdrawal. Add clonidine. Had urinary retention and Foley catheter placed. Will add Flomax and voiding trial tomorrow. Patient has no primary care provider. [facility]. UA shows some hemoglobin, but no definate UTI.  Mark Graham, M.D. [facility]

## 2014-03-03 NOTE — H&P (Signed)
PCP:  No PCP Per Patient   Chief Complaint:  chest pain  HPI: Mark CastleJerry D Brinkmeyer is a 58 y.o. male   has no past medical history on file.   Presented with  Yesterday he ws having feet cramping and chest discomfort that lasted 3-4 hours described as heavy weight on his chest. Started at Rest associated with shortness of breath.  No nausea or vomiting.  Patient have on occasion been walking for few miles at a time does not think that he has any chest pain when he walks. Denies having any cardiac work up in the past. He have not followed up wit MD.  Patient reports pain with urination ans some retention.   Hospitalist was called for admission for chest pain  Review of Systems:    Pertinent positives include:  chest pain, feet cramping, paresthesia in Left arm. dysuria  Constitutional:  No weight loss, night sweats, Fevers, chills, fatigue, weight loss  HEENT:  No headaches, Difficulty swallowing,Tooth/dental problems,Sore throat,  No sneezing, itching, ear ache, nasal congestion, post nasal drip,  Cardio-vascular:  No Orthopnea, PND, anasarca, dizziness, palpitations.no Bilateral lower extremity swelling  GI:  No heartburn, indigestion, abdominal pain, nausea, vomiting, diarrhea, change in bowel habits, loss of appetite, melena, blood in stool, hematemesis Resp:  no shortness of breath at rest. No dyspnea on exertion, No excess mucus, no productive cough, No non-productive cough, No coughing up of blood.No change in color of mucus.No wheezing. Skin:  no rash or lesions. No jaundice GU:  no dysuria, change in color of urine, no urgency or frequency. No straining to urinate.  No flank pain.  Musculoskeletal:  No joint pain or no joint swelling. No decreased range of motion. No back pain.  Psych:  No change in mood or affect. No depression or anxiety. No memory loss.  Neuro: no localizing neurological complaints, no tingling, no weakness, no double vision, no gait abnormality, no slurred  speech, no confusion  Otherwise ROS are negative except for above, 10 systems were reviewed  Past Medical History: History reviewed. No pertinent past medical history. History reviewed. No pertinent past surgical history.   Medications: Prior to Admission medications   Not on File    Allergies:  No Known Allergies  Social History:  Ambulatory   independently   Lives at home alone    reports that he has been smoking Cigarettes.  He has been smoking about 0.00 packs per day. He does not have any smokeless tobacco history on file. He reports that he drinks alcohol. He reports that he does not use illicit drugs.    Family History: family history includes Cancer in his father and sister.    Physical Exam: Patient Vitals for the past 24 hrs:  BP Temp Temp src Pulse Resp SpO2  03/02/14 2246 182/110 mmHg 98.3 F (36.8 C) Oral 113 18 98 %    1. General:  in No Acute distress 2. Psychological: Alert and  Oriented 3. Head/ENT:     Dry Mucous Membranes                          Head Non traumatic, neck supple                           Poor Dentition 4. SKIN:   decreased Skin turgor,  Skin clean Dry and intact no rash 5. Heart: Regular rate and rhythm no Murmur, Rub  or gallop 6. Lungs: occasional wheezes no crackles   7. Abdomen: Soft, non-tender, Non distended 8. Lower extremities: no clubbing, cyanosis, or edema 9. Neurologically Grossly intact, moving all 4 extremities equally 10. MSK: Normal range of motion  body mass index is unknown because there is no height or weight on file.   Labs on Admission:   Recent Labs  03/02/14 2246  NA 131*  K 4.3  CL 91*  CO2 24  GLUCOSE 162*  BUN 9  CREATININE 0.80  CALCIUM 9.5   No results found for this basename: AST, ALT, ALKPHOS, BILITOT, PROT, ALBUMIN,  in the last 72 hours No results found for this basename: LIPASE, AMYLASE,  in the last 72 hours  Recent Labs  03/02/14 2246  WBC 13.2*  HGB 15.3  HCT 42.7  MCV 92.8   PLT 269   No results found for this basename: CKTOTAL, CKMB, CKMBINDEX, TROPONINI,  in the last 72 hours No results found for this basename: TSH, T4TOTAL, FREET3, T3FREE, THYROIDAB,  in the last 72 hours No results found for this basename: VITAMINB12, FOLATE, FERRITIN, TIBC, IRON, RETICCTPCT,  in the last 72 hours No results found for this basename: HGBA1C    CrCl is unknown because there is no height on file for the current visit. ABG No results found for this basename: phart, pco2, po2, hco3, tco2, acidbasedef, o2sat     No results found for this basename: DDIMER     Other results:  I have pearsonaly reviewed this: ECG REPORT  Rate: 107  Rhythm: ST with Left anterior fascicular block  ST&T Change: no ischemic changes   BNP (last 3 results)  Recent Labs  10/20/13 2123  PROBNP 43.5    There were no vitals filed for this visit.   Cultures: No results found for this basename: sdes, specrequest, cult, reptstatus   Radiological Exams on Admission: Dg Chest 2 View  03/03/2014   CLINICAL DATA:  Chest pain.  History of TB.  EXAM: CHEST  2 VIEW  COMPARISON:  10/20/2013  FINDINGS: Pulmonary hyperinflation. No edema, consolidation, effusion, or pneumothorax. Normal heart size and mediastinal contours. An apparent ill-defined nodular density at the right upper chest is considered summation of shadows. Additionally, this region is clear on recent comparison.  IMPRESSION: COPD without superimposed acute disease.   Electronically Signed   By: Tiburcio PeaJonathan  Watts M.D.   On: 03/03/2014 00:30    Chart has been reviewed  Assessment/Plan  Mark DeterSamuel 58-year-old gentleman with history of medical noncompliance here with chest pain and dysuria  Present on Admission:  . Chest pain - we'll admit to telemetry, cycle cardiac enzymes, obtain echogram, risk stratify with lipid panel hemoglobin A1c check TSH keep n.p.o. in case needs further studies  . Hypertension -we'll start with ACE inhibitor  given history of COPD will hold off on beta blocker  . Hyponatremia - we'll obtain urine electrolytes and urine as well or any check orthostatics. Patient have had poor by mouth intake. Hyponatremia likely secondary to hypovolemia will also evaluate liver function  . Leukocytosis - given dysuria to obtain UA to evaluate for UTI Dysuria - UA pending Alcohol use with some occasional binge drinking. The watch for signs of alcohol withdrawal  Prophylaxis:  Lovenox, CODE STATUS:  FULL CODE    Other plan as per orders.  I have spent a total of 55 min on this admission  Mark Graham 03/03/2014, 5:04 AM

## 2014-03-03 NOTE — ED Notes (Signed)
MD at bedside. 

## 2014-03-03 NOTE — Progress Notes (Signed)
03/03/2014 10:34 AM Nursing note Pt. C/o difficulty with voiding. Bladder scan showed greater than 999 ml of residual urine in bladder. Dr. Lendell CapriceSullivan on floor and made aware. Orders received to place foley catheter. Orders enacted. During process of placing foley, pt. Showed RN a squashed bug on his finger. When questioned further, pt. States he has bed bugs. RN noted several bite marks on bilateral lower extremities. Pt. Provided a bath, belongings placed in bags and pt placed on contact precautions. Linen changed as well. Infection prevention, environmental services and MD made aware of findings. Will continue to monitor patient.  Blanchard KelchStephanie Ingold Hadassah Rana

## 2014-03-04 LAB — HEMOGLOBIN A1C
Hgb A1c MFr Bld: 7.9 % — ABNORMAL HIGH (ref <5.7)
Mean Plasma Glucose: 180 mg/dL — ABNORMAL HIGH (ref <117)

## 2014-03-04 LAB — TROPONIN I

## 2014-03-04 LAB — MAGNESIUM: Magnesium: 1.8 mg/dL (ref 1.5–2.5)

## 2014-03-04 LAB — TSH: TSH: 3.69 u[IU]/mL (ref 0.350–4.500)

## 2014-03-04 LAB — PHOSPHORUS: Phosphorus: 3.6 mg/dL (ref 2.3–4.6)

## 2014-03-04 NOTE — Progress Notes (Signed)
Emptied 550 cc of urine and removed foley catheter per order. Patient tolerated well. Urinal placed at bedside. Will monitor output. Call bell at bedside. Bed alarm on. Will continue to monitor.  Valinda HoarLexie Robie Oats RN

## 2014-03-18 NOTE — Discharge Summary (Addendum)
Physician Discharge Summary  KEANDRE POYER DDU:[id number] DOB: [birth year] DOA: 03/03/2014  PCP: No PCP Per Patient  Admit date: 03/03/2014 Left AMA: 5/Mark/2015  Discharge Diagnoses:  Primary problem:    Chest pain Active Problems:   Hypertension   Hyponatremia   Leukocytosis   Acute urinary retention alcohol abuse  Discharge Condition: left ama  Filed Weights   03/03/14 0559  Weight: 77.747 kg (171 lb 6.4 oz)    History of present illness:  58 y.o. Graham  has no past medical history on file.  Presented with  Yesterday he ws having feet cramping and chest discomfort that lasted 3-4 hours described as heavy weight on his chest. Started at Rest associated with shortness of breath. No nausea or vomiting. Patient have on occasion been walking for few miles at a time does not think that he has any chest pain when he walks. Denies having any cardiac work up in the past. He have not followed up wit MD.  Patient reports pain with urination and some retention.   Hospital Course:  Admitted to observation, telemetry. MI ruled out. Placed foley catheter due to urinary retention. UA negative for infection. Echo ordered, but not completed by the time patient left AMA. Foley catheter removed for voiding trial, then patient demanded discharge prior to my making rounds.   Procedures:  none  Consultations:  none  Discharge Exam: Filed Vitals:   03/03/14 2121  BP: 126/86  Pulse: 73  Temp: 98.3 F (36.8 C)  Resp: 18    Discharge Instructions You were cared for by a hospitalist during your hospital stay. If you have any questions about your discharge medications or the care you received while you were in the hospital after you are discharged, you can call the unit and asked to speak with the hospitalist on call if the hospitalist that took care of you is not available. Once you are discharged, your primary care physician will handle any further medical issues. Please note that NO REFILLS for any  discharge medications will be authorized once you are discharged, as it is imperative that you return to your primary care physician (or establish a relationship with a primary care physician if you do not have one) for your aftercare needs so that they can reassess your need for medications and monitor your lab values.     Medication List    Notice   You have not been prescribed any medications.     No Known Allergies    The results of significant diagnostics from this hospitalization (including imaging, microbiology, ancillary and laboratory) are listed below for reference.    Significant Diagnostic Studies: Dg Chest 2 View  03/03/2014   CLINICAL DATA:  Chest pain.  History of TB.  EXAM: CHEST  2 VIEW  COMPARISON:  10/20/2013  FINDINGS: Pulmonary hyperinflation. No edema, consolidation, effusion, or pneumothorax. Normal heart size and mediastinal contours. An apparent ill-defined nodular density at the right upper chest is considered summation of shadows. Additionally, this region is clear on recent comparison.  IMPRESSION: COPD without superimposed acute disease.   Electronically Signed   By: Tiburcio Pea M.D.   On: 03/03/2014 00:30    Microbiology: No results found for this or any previous visit (from the past 240 hour(s)).   Labs: Basic Metabolic Panel: No results found for this basename: NA, K, CL, CO2, GLUCOSE, BUN, CREATININE, CALCIUM, MG, PHOS,  in the last 168 hours Liver Function Tests: No results found for this basename: AST,  ALT, ALKPHOS, BILITOT, PROT, ALBUMIN,  in the last 168 hours No results found for this basename: LIPASE, AMYLASE,  in the last 168 hours No results found for this basename: AMMONIA,  in the last 168 hours CBC: No results found for this basename: WBC, NEUTROABS, HGB, HCT, MCV, PLT,  in the last 168 hours Cardiac Enzymes: No results found for this basename: CKTOTAL, CKMB, CKMBINDEX, TROPONINI,  in the last 168 hours BNP: BNP (last 3  results)  Recent Labs  10/20/13 2123  PROBNP 43.5   CBG: No results found for this basename: GLUCAP,  in the last 168 hours  EKG  Suspect arm lead reversal Normal sinus rhythm Right superior axis deviation Low voltage QRS Incomplete right bundle branch block Cannot rule out Anterior infarct , age undetermined Abnormal ECG   Signed:  Christiane HaCorinna L Shaneque Merkle  [facility] 03/18/2014, 1:21 PM

## 2015-01-15 ENCOUNTER — Emergency Department (HOSPITAL_COMMUNITY)
Admission: EM | Admit: 2015-01-15 | Discharge: 2015-01-16 | Disposition: A | Discharge status: Home / Self Care | Payer: Self-pay | Attending: Emergency Medicine | Admitting: Emergency Medicine

## 2015-01-15 ENCOUNTER — Emergency Department (HOSPITAL_COMMUNITY): Payer: Self-pay

## 2015-01-15 ENCOUNTER — Encounter (HOSPITAL_COMMUNITY): Payer: Self-pay | Admitting: Emergency Medicine

## 2015-01-15 DIAGNOSIS — R42 Dizziness and giddiness: Secondary | ICD-10-CM

## 2015-01-15 DIAGNOSIS — F1010 Alcohol abuse, uncomplicated: Secondary | ICD-10-CM | POA: Insufficient documentation

## 2015-01-15 DIAGNOSIS — I10 Essential (primary) hypertension: Secondary | ICD-10-CM | POA: Insufficient documentation

## 2015-01-15 DIAGNOSIS — Z720 Tobacco use: Secondary | ICD-10-CM | POA: Insufficient documentation

## 2015-01-15 LAB — RAPID URINE DRUG SCREEN, HOSP PERFORMED
Amphetamines: NOT DETECTED
Barbiturates: NOT DETECTED
Benzodiazepines: NOT DETECTED
Cocaine: NOT DETECTED
Opiates: NOT DETECTED
TETRAHYDROCANNABINOL: NOT DETECTED

## 2015-01-15 LAB — URINALYSIS, ROUTINE W REFLEX MICROSCOPIC
BILIRUBIN URINE: NEGATIVE
GLUCOSE, UA: NEGATIVE mg/dL
Ketones, ur: NEGATIVE mg/dL
Nitrite: NEGATIVE
PROTEIN: NEGATIVE mg/dL
Specific Gravity, Urine: 1.015 (ref 1.005–1.030)
Urobilinogen, UA: 1 mg/dL (ref 0.0–1.0)
pH: 6 (ref 5.0–8.0)

## 2015-01-15 LAB — CBC
HCT: 41.2 % (ref 39.0–52.0)
Hemoglobin: 14.3 g/dL (ref 13.0–17.0)
MCH: 33.5 pg (ref 26.0–34.0)
MCHC: 34.7 g/dL (ref 30.0–36.0)
MCV: 96.5 fL (ref 78.0–100.0)
Platelets: 256 10*3/uL (ref 150–400)
RBC: 4.27 MIL/uL (ref 4.22–5.81)
RDW: 14 % (ref 11.5–15.5)
WBC: 8.5 10*3/uL (ref 4.0–10.5)

## 2015-01-15 LAB — URINE MICROSCOPIC-ADD ON

## 2015-01-15 LAB — COMPREHENSIVE METABOLIC PANEL
ALBUMIN: 4.1 g/dL (ref 3.5–5.2)
ALK PHOS: 108 U/L (ref 39–117)
ALT: 16 U/L (ref 0–53)
ANION GAP: 8 (ref 5–15)
AST: 27 U/L (ref 0–37)
BILIRUBIN TOTAL: 0.6 mg/dL (ref 0.3–1.2)
BUN: 11 mg/dL (ref 6–23)
CO2: 27 mmol/L (ref 19–32)
Calcium: 9.5 mg/dL (ref 8.4–10.5)
Chloride: 103 mmol/L (ref 96–112)
Creatinine, Ser: 0.9 mg/dL (ref 0.50–1.35)
GFR calc non Af Amer: >90 mL/min (ref >90)
GLUCOSE: 165 mg/dL — AB (ref 70–99)
POTASSIUM: 4 mmol/L (ref 3.5–5.1)
Sodium: 138 mmol/L (ref 135–145)
Total Protein: 6.8 g/dL (ref 6.0–8.3)

## 2015-01-15 LAB — AMMONIA: Ammonia: 45 umol/L — ABNORMAL HIGH (ref 11–32)

## 2015-01-15 LAB — CBG MONITORING, ED: Glucose-Capillary: 166 mg/dL — ABNORMAL HIGH (ref 70–99)

## 2015-01-15 LAB — PROTIME-INR
INR: 0.99 (ref 0.00–1.49)
PROTHROMBIN TIME: 13.1 s (ref 11.6–15.2)

## 2015-01-15 LAB — ETHANOL

## 2015-01-15 MED ORDER — LABETALOL HCL 5 MG/ML IV SOLN: 20.0000 mg | Freq: Once | INTRAVENOUS | Status: DC

## 2015-01-15 MED ORDER — FOLIC ACID 1 MG PO TABS
1.0000 mg | ORAL_TABLET | Freq: Once | ORAL | Status: AC
  Administered 2015-01-15: 1 mg via ORAL
  Filled 2015-01-15: qty 1

## 2015-01-15 MED ORDER — HYDROCHLOROTHIAZIDE 25 MG PO TABS
25.0000 mg | ORAL_TABLET | Freq: Every day | ORAL | Status: DC
  Administered 2015-01-15: 25 mg via ORAL
  Filled 2015-01-15: qty 1

## 2015-01-15 MED ORDER — HYDROCHLOROTHIAZIDE 25 MG PO TABS: 25.0000 mg | ORAL_TABLET | Freq: Every day | ORAL | Status: DC

## 2015-01-15 MED ORDER — SODIUM CHLORIDE 0.9 % IV BOLUS (SEPSIS)
1000.0000 mL | Freq: Once | INTRAVENOUS | Status: AC
  Administered 2015-01-15: 1000 mL via INTRAVENOUS

## 2015-01-15 MED ORDER — THIAMINE HCL 100 MG/ML IJ SOLN
100.0000 mg | Freq: Once | INTRAMUSCULAR | Status: AC
  Administered 2015-01-15: 100 mg via INTRAVENOUS
  Filled 2015-01-15: qty 2

## 2015-01-15 NOTE — ED Provider Notes (Signed)
CSN: [id number]     Arrival date & time 01/15/15  1826 History   First MD Initiated Contact with Patient 01/15/15 1928     Chief Complaint  Patient presents with  . Dizziness  . Altered Mental Status     (Consider location/radiation/quality/duration/timing/severity/associated sxs/prior Treatment) Patient is a 59 y.o. Graham presenting with dizziness. The history is provided by the patient.  Dizziness Quality:  Lightheadedness Severity:  Moderate Onset quality:  Gradual Timing:  Intermittent Progression:  Waxing and waning Chronicity:  New Relieved by: rest. Exacerbated by: long exertion. Associated symptoms: no chest pain, no headaches, no nausea, no palpitations, no shortness of breath, no vomiting and no weakness     History reviewed. No pertinent past medical history. History reviewed. No pertinent past surgical history. Family History  Problem Relation Age of Onset  . Cancer Father   . Cancer Sister    History  Substance Use Topics  . Smoking status: Current Every Day Smoker    Types: Cigarettes  . Smokeless tobacco: Not on file  . Alcohol Use: Yes     Comment: drinks when he has any money    Review of Systems  Respiratory: Negative for shortness of breath.   Cardiovascular: Negative for chest pain and palpitations.  Gastrointestinal: Negative for nausea and vomiting.  Neurological: Positive for dizziness. Negative for weakness and headaches.  All other systems reviewed and are negative.     Allergies  Review of patient's allergies indicates no known allergies.  Home Medications   Prior to Admission medications   Medication Sig Start Date End Date Taking? Authorizing Provider  hydrochlorothiazide (HYDRODIURIL) 25 MG tablet Take 1 tablet (25 mg total) by mouth daily. 01/16/15   Dorna Leitz, MD   BP 155/93 mmHg  Pulse 84  Temp(Src) 97.9 F (36.6 C) (Oral)  Resp 16  Ht  (1.702 m)  SpO2 95% Physical Exam  Constitutional: He is oriented to person,  place, and time. He appears well-developed and well-nourished. No distress.  Poor hygiene. Tangential with strange affect but answers questions appropriately. Not intoxicated.  HENT:  Head: Normocephalic and atraumatic.  Mouth/Throat: Oropharynx is clear and moist. No oropharyngeal exudate.  Eyes: Conjunctivae and EOM are normal. Pupils are equal, round, and reactive to light.  Neck: Normal range of motion. Neck supple.  Cardiovascular: Normal rate, regular rhythm, normal heart sounds and intact distal pulses.  Exam reveals no gallop and no friction rub.   No murmur heard. Pulmonary/Chest: Effort normal and breath sounds normal. No respiratory distress. He has no wheezes. He has no rales.  Abdominal: Soft. He exhibits no distension and no mass. There is no tenderness. There is no rebound and no guarding.  Musculoskeletal: Normal range of motion. He exhibits no edema or tenderness.  Lymphadenopathy:    He has no cervical adenopathy.  Neurological: He is alert and oriented to person, place, and time. He has normal strength. No cranial nerve deficit or sensory deficit. He displays a negative Romberg sign. Coordination and gait normal.  Ambulatory without ataxia. No dysmetria.  Skin: Skin is warm and dry. No rash noted. He is not diaphoretic.  Psychiatric: He has a normal mood and affect. His behavior is normal. Judgment and thought content normal.  Nursing note and vitals reviewed.   ED Course  Procedures (including critical care time) Labs Review Labs Reviewed  COMPREHENSIVE METABOLIC PANEL - Abnormal; Notable for the following:    Glucose, Bld 165 (*)    All other components within  normal limits  URINALYSIS, ROUTINE W REFLEX MICROSCOPIC - Abnormal; Notable for the following:    Hgb urine dipstick MODERATE (*)    Leukocytes, UA SMALL (*)    All other components within normal limits  AMMONIA - Abnormal; Notable for the following:    Ammonia 45 (*)    All other components within normal  limits  CBG MONITORING, ED - Abnormal; Notable for the following:    Glucose-Capillary 166 (*)    All other components within normal limits  URINE CULTURE  CBC  URINE RAPID DRUG SCREEN (HOSP PERFORMED)  ETHANOL  PROTIME-INR  URINE MICROSCOPIC-ADD ON    Imaging Review Ct Head Wo Contrast  01/15/2015   CLINICAL DATA:  Acute onset of dizziness and high blood pressure. Initial encounter.  EXAM: CT HEAD WITHOUT CONTRAST  TECHNIQUE: Contiguous axial images were obtained from the base of the skull through the vertex without intravenous contrast.  COMPARISON:  None.  FINDINGS: There is no evidence of acute infarction, mass lesion, or intra- or extra-axial hemorrhage on CT.  Mild periventricular white matter change likely reflects small vessel ischemic microangiopathy.  The posterior fossa, including the cerebellum, brainstem and fourth ventricle, is within normal limits. The third and lateral ventricles, and basal ganglia are unremarkable in appearance. The cerebral hemispheres are symmetric in appearance, with normal gray-white differentiation. No mass effect or midline shift is seen.  There is no evidence of fracture; visualized osseous structures are unremarkable in appearance. The orbits are within normal limits. The paranasal sinuses and mastoid air cells are well-aerated. No significant soft tissue abnormalities are seen.  IMPRESSION: 1. No acute intracranial pathology seen on CT. 2. Mild small vessel ischemic microangiopathy.   Electronically Signed   By: Roanna Raider M.D.   On: 01/15/2015 21:17     EKG Interpretation None      MDM   Final diagnoses:  Essential hypertension  Alcohol abuse  Dizziness    59 year old Graham with past medical history of alcohol abuse and hypertension presents with chief complaint of dizziness. Dizziness has resolved. It was initially with a long walk to the hospital. Has had intermittent episodes over the last week described as lightheadedness. Denies chest  pain or shortness breath. Denies syncope. The cardiac history. Dizziness is asymptomatic here and he has a nonfocal neurologic exam. CT head was obtained and negative. Given reassuring exam, no further workup at this time.  On my exam the patient is tangential and at times slightly confused although he reiterates quickly. He is able ambulate without difficulty. He is afebrile. Vital signs are stable. He has no infectious symptoms. I observed the patient for a period time while obtaining an altered mental status workup as I was unsure of the patient's neurologic baseline. Workup obtained unremarkable with urinalysis negative and basic labs including UDS negative. His ammonia is just above the cutoff of 40 which is likely reflective of his ongoing alcohol abuse, but not reflective of hepatic encephalopathy as he has no other symptoms.. On repeat exam his neurologic exam appears to be unchanged and he has relatively normal mental status. He is able to answer questions without difficulty and remembers my name as well as previous interactions from 2 hours prior. Given no neurologic deficits as well as reassuring workup, I feel that his mental status is likely his neurologic baseline as a sequela of his alcohol abuse. Discussed need for follow-up with the wellness Center of the next 2-3 days as well as need for better blood pressure  regulation. HCTZ prescription written and dose given in the emergency department. Wellness Center contact information given.  Dorna LeitzAlex Cranford Blessinger, MD 01/16/15 0028  Blane OharaJoshua Zavitz, MD 01/16/15 [phone]

## 2015-01-15 NOTE — Discharge Instructions (Signed)
Hypertension Hypertension is another name for high blood pressure. High blood pressure forces your heart to work harder to pump blood. A blood pressure reading has two numbers, which includes a higher number over a lower number (example: 110/72). HOME CARE   Have your blood pressure rechecked by your doctor.  Only take medicine as told by your doctor. Follow the directions carefully. The medicine does not work as well if you skip doses. Skipping doses also puts you at risk for problems.  Do not smoke.  Monitor your blood pressure at home as told by your doctor. GET HELP IF:  You think you are having a reaction to the medicine you are taking.  You have repeat headaches or feel dizzy.  You have puffiness (swelling) in your ankles.  You have trouble with your vision. GET HELP RIGHT AWAY IF:   You get a very bad headache and are confused.  You feel weak, numb, or faint.  You get chest or belly (abdominal) pain.  You throw up (vomit).  You cannot breathe very well. MAKE SURE YOU:   Understand these instructions.  Will watch your condition.  Will get help right away if you are not doing well or get worse. Document Released: 03/30/2008 Document Revised: 10/17/2013 Document Reviewed: 08/04/2013 Exit[employer] Patient Information 2015 [employer]. This information is not intended to replace advice given to you by your health care provider. Make sure you discuss any questions you have with your health care provider.  

## 2015-01-15 NOTE — ED Notes (Signed)
MD at bedside. 

## 2015-01-15 NOTE — ED Notes (Signed)
No focal neuro deficits in triage. Patient ambulatory in and out of triage.

## 2015-01-15 NOTE — ED Notes (Signed)
Patient here with complaint of dizziness starting about 1 week ago. Patient oriented but with scattered thoughts in triage. Frequently required re-direction to get pertinent information. States he has had high blood pressure, but doesn't take medicine for it. BP in triage @ 179/115. States no PCP; reports last time he was seen by medical personnel was last May.

## 2015-01-17 LAB — URINE CULTURE
Colony Count: NO GROWTH
Culture: NO GROWTH

## 2015-10-20 ENCOUNTER — Observation Stay (HOSPITAL_COMMUNITY)
Admission: EM | Admit: 2015-10-20 | Discharge: 2015-10-21 | Disposition: A | Discharge status: Home / Self Care | Payer: Self-pay | Attending: Internal Medicine | Admitting: Internal Medicine

## 2015-10-20 DIAGNOSIS — R0602 Shortness of breath: Secondary | ICD-10-CM

## 2015-10-20 DIAGNOSIS — M79671 Pain in right foot: Secondary | ICD-10-CM | POA: Insufficient documentation

## 2015-10-20 DIAGNOSIS — R1011 Right upper quadrant pain: Secondary | ICD-10-CM | POA: Diagnosis present

## 2015-10-20 DIAGNOSIS — Z720 Tobacco use: Secondary | ICD-10-CM | POA: Diagnosis present

## 2015-10-20 DIAGNOSIS — Z79899 Other long term (current) drug therapy: Secondary | ICD-10-CM | POA: Insufficient documentation

## 2015-10-20 DIAGNOSIS — M79672 Pain in left foot: Secondary | ICD-10-CM | POA: Insufficient documentation

## 2015-10-20 DIAGNOSIS — I209 Angina pectoris, unspecified: Secondary | ICD-10-CM

## 2015-10-20 DIAGNOSIS — E1165 Type 2 diabetes mellitus with hyperglycemia: Secondary | ICD-10-CM | POA: Insufficient documentation

## 2015-10-20 DIAGNOSIS — I10 Essential (primary) hypertension: Secondary | ICD-10-CM | POA: Diagnosis present

## 2015-10-20 DIAGNOSIS — R739 Hyperglycemia, unspecified: Secondary | ICD-10-CM | POA: Diagnosis present

## 2015-10-20 DIAGNOSIS — R21 Rash and other nonspecific skin eruption: Secondary | ICD-10-CM | POA: Insufficient documentation

## 2015-10-20 DIAGNOSIS — R609 Edema, unspecified: Secondary | ICD-10-CM

## 2015-10-20 DIAGNOSIS — F17210 Nicotine dependence, cigarettes, uncomplicated: Secondary | ICD-10-CM | POA: Insufficient documentation

## 2015-10-20 DIAGNOSIS — R350 Frequency of micturition: Secondary | ICD-10-CM | POA: Diagnosis present

## 2015-10-20 DIAGNOSIS — Z833 Family history of diabetes mellitus: Secondary | ICD-10-CM | POA: Insufficient documentation

## 2015-10-20 DIAGNOSIS — R079 Chest pain, unspecified: Principal | ICD-10-CM | POA: Diagnosis present

## 2015-10-20 HISTORY — DX: Essential (primary) hypertension: I10

## 2015-10-20 HISTORY — DX: Tobacco use: Z72.0

## 2015-10-21 ENCOUNTER — Observation Stay (HOSPITAL_COMMUNITY): Payer: Self-pay

## 2015-10-21 ENCOUNTER — Observation Stay (HOSPITAL_BASED_OUTPATIENT_CLINIC_OR_DEPARTMENT_OTHER): Payer: Self-pay

## 2015-10-21 ENCOUNTER — Emergency Department (HOSPITAL_COMMUNITY): Payer: Self-pay

## 2015-10-21 ENCOUNTER — Encounter (HOSPITAL_COMMUNITY): Payer: Self-pay | Admitting: Emergency Medicine

## 2015-10-21 ENCOUNTER — Observation Stay (HOSPITAL_COMMUNITY): Payer: MEDICAID

## 2015-10-21 DIAGNOSIS — Z720 Tobacco use: Secondary | ICD-10-CM

## 2015-10-21 DIAGNOSIS — I10 Essential (primary) hypertension: Secondary | ICD-10-CM

## 2015-10-21 DIAGNOSIS — R609 Edema, unspecified: Secondary | ICD-10-CM

## 2015-10-21 DIAGNOSIS — R21 Rash and other nonspecific skin eruption: Secondary | ICD-10-CM

## 2015-10-21 DIAGNOSIS — R072 Precordial pain: Secondary | ICD-10-CM

## 2015-10-21 DIAGNOSIS — R350 Frequency of micturition: Secondary | ICD-10-CM

## 2015-10-21 DIAGNOSIS — I209 Angina pectoris, unspecified: Secondary | ICD-10-CM

## 2015-10-21 DIAGNOSIS — R739 Hyperglycemia, unspecified: Secondary | ICD-10-CM

## 2015-10-21 DIAGNOSIS — R079 Chest pain, unspecified: Principal | ICD-10-CM

## 2015-10-21 DIAGNOSIS — R1011 Right upper quadrant pain: Secondary | ICD-10-CM

## 2015-10-21 LAB — LIPASE, BLOOD
Lipase: 29 U/L (ref 11–51)
Lipase: 31 U/L (ref 11–51)

## 2015-10-21 LAB — COMPREHENSIVE METABOLIC PANEL
ALBUMIN: 3.5 g/dL (ref 3.5–5.0)
ALK PHOS: 106 U/L (ref 38–126)
ALK PHOS: 98 U/L (ref 38–126)
ALT: 15 U/L — AB (ref 17–63)
ALT: 16 U/L — ABNORMAL LOW (ref 17–63)
ANION GAP: 9 (ref 5–15)
AST: 23 U/L (ref 15–41)
AST: 38 U/L (ref 15–41)
Albumin: 3.8 g/dL (ref 3.5–5.0)
Anion gap: 12 (ref 5–15)
BILIRUBIN TOTAL: 0.7 mg/dL (ref 0.3–1.2)
BILIRUBIN TOTAL: 1.2 mg/dL (ref 0.3–1.2)
BUN: 10 mg/dL (ref 6–20)
BUN: 9 mg/dL (ref 6–20)
CALCIUM: 9.2 mg/dL (ref 8.9–10.3)
CALCIUM: 9.4 mg/dL (ref 8.9–10.3)
CO2: 24 mmol/L (ref 22–32)
CO2: 27 mmol/L (ref 22–32)
CREATININE: 0.96 mg/dL (ref 0.61–1.24)
CREATININE: 1.12 mg/dL (ref 0.61–1.24)
Chloride: 103 mmol/L (ref 101–111)
Chloride: 107 mmol/L (ref 101–111)
GFR calc Af Amer: >60 mL/min (ref >60)
GFR calc Af Amer: >60 mL/min (ref >60)
GFR calc non Af Amer: >60 mL/min (ref >60)
GLUCOSE: 230 mg/dL — AB (ref 65–99)
GLUCOSE: 82 mg/dL (ref 65–99)
POTASSIUM: 4.1 mmol/L (ref 3.5–5.1)
Potassium: 3.6 mmol/L (ref 3.5–5.1)
SODIUM: 143 mmol/L (ref 135–145)
Sodium: 139 mmol/L (ref 135–145)
TOTAL PROTEIN: 6.2 g/dL — AB (ref 6.5–8.1)
TOTAL PROTEIN: 6.7 g/dL (ref 6.5–8.1)

## 2015-10-21 LAB — URINE MICROSCOPIC-ADD ON

## 2015-10-21 LAB — GLUCOSE, CAPILLARY
GLUCOSE-CAPILLARY: 152 mg/dL — AB (ref 65–99)
Glucose-Capillary: 113 mg/dL — ABNORMAL HIGH (ref 65–99)
Glucose-Capillary: 103 mg/dL — ABNORMAL HIGH (ref 65–99)

## 2015-10-21 LAB — CBC WITH DIFFERENTIAL/PLATELET
Basophils Absolute: 0.1 10*3/uL (ref 0.0–0.1)
Basophils Relative: 1 %
Eosinophils Absolute: 0.1 10*3/uL (ref 0.0–0.7)
Eosinophils Relative: 1 %
HCT: 41.8 % (ref 39.0–52.0)
HEMOGLOBIN: 14.6 g/dL (ref 13.0–17.0)
LYMPHS PCT: 22 %
Lymphs Abs: 2 10*3/uL (ref 0.7–4.0)
MCH: 34.2 pg — ABNORMAL HIGH (ref 26.0–34.0)
MCHC: 34.9 g/dL (ref 30.0–36.0)
MCV: 97.9 fL (ref 78.0–100.0)
MONO ABS: 0.9 10*3/uL (ref 0.1–1.0)
MONOS PCT: 10 %
NEUTROS PCT: 66 %
Neutro Abs: 6.2 10*3/uL (ref 1.7–7.7)
Platelets: 313 10*3/uL (ref 150–400)
RBC: 4.27 MIL/uL (ref 4.22–5.81)
RDW: 13.8 % (ref 11.5–15.5)
WBC: 9.3 10*3/uL (ref 4.0–10.5)

## 2015-10-21 LAB — URINALYSIS, ROUTINE W REFLEX MICROSCOPIC
Bilirubin Urine: NEGATIVE
GLUCOSE, UA: 100 mg/dL — AB
Ketones, ur: NEGATIVE mg/dL
NITRITE: NEGATIVE
PROTEIN: NEGATIVE mg/dL
Specific Gravity, Urine: 1.02 (ref 1.005–1.030)
pH: 6.5 (ref 5.0–8.0)

## 2015-10-21 LAB — TROPONIN I

## 2015-10-21 LAB — I-STAT TROPONIN, ED: Troponin i, poc: 0 ng/mL (ref 0.00–0.08)

## 2015-10-21 LAB — CK: Total CK: 205 U/L (ref 49–397)

## 2015-10-21 LAB — SEDIMENTATION RATE: Sed Rate: 12 mm/hr (ref 0–16)

## 2015-10-21 LAB — C-REACTIVE PROTEIN: CRP: 0.8 mg/dL (ref <1.0)

## 2015-10-21 LAB — TSH: TSH: 3.532 u[IU]/mL (ref 0.350–4.500)

## 2015-10-21 MED ORDER — GLIPIZIDE ER 2.5 MG PO TB24: 2.5000 mg | ORAL_TABLET | Freq: Every day | ORAL | Status: DC

## 2015-10-21 MED ORDER — NICOTINE 21 MG/24HR TD PT24: 21.0000 mg | MEDICATED_PATCH | Freq: Every day | TRANSDERMAL | Status: DC

## 2015-10-21 MED ORDER — HYDRALAZINE HCL 20 MG/ML IJ SOLN: 10.0000 mg | INTRAMUSCULAR | Status: DC | PRN

## 2015-10-21 MED ORDER — NITROGLYCERIN 0.4 MG SL SUBL: 0.4000 mg | SUBLINGUAL_TABLET | SUBLINGUAL | Status: DC | PRN

## 2015-10-21 MED ORDER — KETOROLAC TROMETHAMINE 30 MG/ML IJ SOLN
30.0000 mg | Freq: Once | INTRAMUSCULAR | Status: AC
  Administered 2015-10-21: 30 mg via INTRAVENOUS
  Filled 2015-10-21: qty 1

## 2015-10-21 MED ORDER — ONDANSETRON HCL 4 MG/2ML IJ SOLN: 4.0000 mg | Freq: Four times a day (QID) | INTRAMUSCULAR | Status: DC | PRN

## 2015-10-21 MED ORDER — HYDRALAZINE HCL 20 MG/ML IJ SOLN
10.0000 mg | INTRAMUSCULAR | Status: DC | PRN
  Filled 2015-10-21: qty 1

## 2015-10-21 MED ORDER — ASPIRIN 325 MG PO TABS
325.0000 mg | ORAL_TABLET | Freq: Once | ORAL | Status: AC
  Administered 2015-10-21: 325 mg via ORAL
  Filled 2015-10-21: qty 1

## 2015-10-21 MED ORDER — MORPHINE SULFATE (PF) 2 MG/ML IV SOLN: 2.0000 mg | INTRAVENOUS | Status: DC | PRN

## 2015-10-21 MED ORDER — GLIPIZIDE 5 MG PO TABS: 5.0000 mg | ORAL_TABLET | Freq: Every day | ORAL | Status: DC

## 2015-10-21 MED ORDER — IOHEXOL 350 MG/ML SOLN
80.0000 mL | Freq: Once | INTRAVENOUS | Status: AC | PRN
  Administered 2015-10-21: 80 mL via INTRAVENOUS

## 2015-10-21 MED ORDER — GI COCKTAIL ~~LOC~~: 30.0000 mL | Freq: Four times a day (QID) | ORAL | Status: DC | PRN

## 2015-10-21 MED ORDER — HYDROCORTISONE 1 % EX CREA
TOPICAL_CREAM | Freq: Two times a day (BID) | CUTANEOUS | Status: DC
  Administered 2015-10-21: 10:00:00 via TOPICAL
  Filled 2015-10-21: qty 28

## 2015-10-21 MED ORDER — ENOXAPARIN SODIUM 40 MG/0.4ML ~~LOC~~ SOLN
40.0000 mg | Freq: Every day | SUBCUTANEOUS | Status: DC
  Administered 2015-10-21: 40 mg via SUBCUTANEOUS
  Filled 2015-10-21: qty 0.4

## 2015-10-21 MED ORDER — GABAPENTIN 400 MG PO CAPS: 400.0000 mg | ORAL_CAPSULE | Freq: Every day | ORAL | Status: DC

## 2015-10-21 MED ORDER — INSULIN ASPART 100 UNIT/ML ~~LOC~~ SOLN
0.0000 [IU] | SUBCUTANEOUS | Status: DC
  Administered 2015-10-21: 2 [IU] via SUBCUTANEOUS

## 2015-10-21 MED ORDER — ACETAMINOPHEN 325 MG PO TABS: 650.0000 mg | ORAL_TABLET | ORAL | Status: DC | PRN

## 2015-10-21 MED ORDER — SODIUM CHLORIDE 0.9 % IV BOLUS (SEPSIS)
1000.0000 mL | Freq: Once | INTRAVENOUS | Status: AC
  Administered 2015-10-21: 1000 mL via INTRAVENOUS

## 2015-10-21 NOTE — Care Management Note (Signed)
Case Management Note Donn PieriniKristi Dawsyn Ramsaran RN, BSN Unit 2W-Case Manager [phone]   Patient Details  Name: Mark CastleJerry D Graham MRN: [id number] Date of Birth: 10/04/1956  Subjective/Objective:  Pt admitted with chest pain                  Action/Plan: PTA pt lived at home- referral for PCP needs- spoke with pt at bedside- who states that [facility] but has been unable to make an appointment or go in as a walk in. Info again given to pt on both [facility] with establishing a PCP- pt also given a list of other clinics in town that take self pay patients. Pt [facility]- explained to pt that it may be into Jan. Before he can get an appointment and that he may need to call and ask if they have any cancellations that he might could get an appointment that way. Pt expressed appreciation for the info.  Expected Discharge Date:                  Expected Discharge Plan:  Home/Self Care  In-House Referral:     Discharge planning Services  CM Consult, Indigent Health Clinic  Post Acute Care Choice:    Choice offered to:     DME Arranged:    DME Agency:     HH Arranged:    HH Agency:     Status of Service:  Completed, signed off  Medicare Important Message Given:    Date Medicare IM Given:    Medicare IM give by:    Date Additional Medicare IM Given:    Additional Medicare Important Message give by:     If discussed at Long Length of Stay Meetings, dates discussed:    Additional Comments:  Darrold SpanWebster, Aleksey Newbern Heiney, RN 10/21/2015, 12:37 PM

## 2015-10-21 NOTE — Discharge Summary (Addendum)
Physician Discharge Summary  Mark Graham MRN: [id number] DOB/AGE: 12-21-1955 59 y.o.  PCP: No PCP Per Patient   Admit date: 10/20/2015 Discharge date: 10/21/2015  Discharge Diagnoses:     Principal Problem:   Chest pain Active Problems:   Hypertension   Tobacco abuse   Hyperglycemia   Urinary frequency   RUQ abdominal pain  new-onset diabetes    Follow-up recommendations Follow-up with PCP in 3-5 days , including all  additional recommended appointments as below Follow-up CBC, CMP in 3-5 days Follow results of hemoglobin A1c, 2-D echo      Medication List    TAKE these medications        glipiZIDE 2.5 MG 24 hr tablet  Commonly known as:  GLUCOTROL XL  Take 1 tablet (2.5 mg total) by mouth daily with breakfast.     nicotine 21 mg/24hr patch  Commonly known as:  NICODERM CQ  Place 1 patch (21 mg total) onto the skin daily.     SLEEP AID PO  Take 2 tablets by mouth at bedtime as needed (sleep).         Discharge Condition: Stable Discharge Instructions       Discharge Instructions    Diet - low sodium heart healthy    Complete by:  As directed      Increase activity slowly    Complete by:  As directed            No Known Allergies    Disposition: 01-Home or Self Care   Consults:  None    Significant Diagnostic Studies:  Dg Chest 2 View  10/21/2015  CLINICAL DATA:  59 year old male with chest pain EXAM: CHEST  2 VIEW COMPARISON:  Chest radiograph dated 03/02/2014 FINDINGS: The heart size and mediastinal contours are within normal limits. Both lungs are clear. The visualized skeletal structures are unremarkable. IMPRESSION: No active cardiopulmonary disease. Electronically Signed   By: Anner Crete M.D.   On: 10/21/2015 01:49     2-D echo, results pending at this time    [facility] Weights   10/21/15 0002 10/21/15 0514  Weight: 74.844 kg (165 lb) 76.7 kg (169 lb 1.5 oz)     Microbiology: No results found for this or any  previous visit (from the past 240 hour(s)).     Blood Culture    Component Value Date/Time   SDES URINE, CLEAN CATCH 01/15/2015 1946   SPECREQUEST NONE 01/15/2015 1946   CULT NO GROWTH Performed at [facility]  01/15/2015 1946   REPTSTATUS 01/17/2015 FINAL 01/15/2015 1946      Labs: Results for orders placed or performed during the hospital encounter of 10/20/15 (from the past 48 hour(s))  CBC with Differential/Platelet     Status: Abnormal   Collection Time: 10/21/15 12:17 AM  Result Value Ref Range   WBC 9.3 4.0 - 10.5 K/uL   RBC 4.27 4.22 - 5.81 MIL/uL   Hemoglobin 14.6 13.0 - 17.0 g/dL   HCT 41.8 39.0 - 52.0 %   MCV 97.9 78.0 - 100.0 fL   MCH 34.2 (H) 26.0 - 34.0 pg   MCHC 34.9 30.0 - 36.0 g/dL   RDW 13.8 11.5 - 15.5 %   Platelets 313 150 - 400 K/uL   Neutrophils Relative % 66 %   Neutro Abs 6.2 1.7 - 7.7 K/uL   Lymphocytes Relative 22 %   Lymphs Abs 2.0 0.7 - 4.0 K/uL   Monocytes Relative 10 %   Monocytes Absolute 0.9  0.1 - 1.0 K/uL   Eosinophils Relative 1 %   Eosinophils Absolute 0.1 0.0 - 0.7 K/uL   Basophils Relative 1 %   Basophils Absolute 0.1 0.0 - 0.1 K/uL  Comprehensive metabolic panel     Status: Abnormal   Collection Time: 10/21/15 12:17 AM  Result Value Ref Range   Sodium 139 135 - 145 mmol/L   Potassium 4.1 3.5 - 5.1 mmol/L    Comment: SPECIMEN HEMOLYZED. HEMOLYSIS MAY AFFECT INTEGRITY OF RESULTS.   Chloride 103 101 - 111 mmol/L   CO2 24 22 - 32 mmol/L   Glucose, Bld 230 (H) 65 - 99 mg/dL   BUN 9 6 - 20 mg/dL   Creatinine, Ser 1.12 0.61 - 1.24 mg/dL   Calcium 9.4 8.9 - 10.3 mg/dL   Total Protein 6.7 6.5 - 8.1 g/dL   Albumin 3.8 3.5 - 5.0 g/dL   AST 38 15 - 41 U/L   ALT 16 (L) 17 - 63 U/L   Alkaline Phosphatase 106 38 - 126 U/L   Total Bilirubin 1.2 0.3 - 1.2 mg/dL   GFR calc non Af Amer >60 >60 mL/min   GFR calc Af Amer >60 >60 mL/min    Comment: (NOTE) The eGFR has been calculated using the CKD EPI equation. This calculation  has not been validated in all clinical situations. eGFR's persistently <60 mL/min signify possible Chronic Kidney Disease.    Anion gap 12 5 - 15  Lipase, blood     Status: None   Collection Time: 10/21/15 12:17 AM  Result Value Ref Range   Lipase 29 11 - 51 U/L  I-stat troponin, ED     Status: None   Collection Time: 10/21/15 12:52 AM  Result Value Ref Range   Troponin i, poc 0.00 0.00 - 0.08 ng/mL   Comment 3            Comment: Due to the release kinetics of cTnI, a negative result within the first hours of the onset of symptoms does not rule out myocardial infarction with certainty. If myocardial infarction is still suspected, repeat the test at appropriate intervals.   Urinalysis, Routine w reflex microscopic (not at [facility])     Status: Abnormal   Collection Time: 10/21/15  5:54 AM  Result Value Ref Range   Color, Urine YELLOW YELLOW   APPearance CLOUDY (A) CLEAR   Specific Gravity, Urine 1.020 1.005 - 1.030   pH 6.5 5.0 - 8.0   Glucose, UA 100 (A) NEGATIVE mg/dL   Hgb urine dipstick SMALL (A) NEGATIVE   Bilirubin Urine NEGATIVE NEGATIVE   Ketones, ur NEGATIVE NEGATIVE mg/dL   Protein, ur NEGATIVE NEGATIVE mg/dL   Nitrite NEGATIVE NEGATIVE   Leukocytes, UA SMALL (A) NEGATIVE  Urine microscopic-add on     Status: Abnormal   Collection Time: 10/21/15  5:54 AM  Result Value Ref Range   Squamous Epithelial / LPF 0-5 (A) NONE SEEN   WBC, UA 0-5 0 - 5 WBC/hpf   RBC / HPF 6-30 0 - 5 RBC/hpf   Bacteria, UA RARE (A) NONE SEEN   Casts HYALINE CASTS (A) NEGATIVE  Troponin I-serum (0, 3, 6 hours)     Status: None   Collection Time: 10/21/15  6:09 AM  Result Value Ref Range   Troponin I <0.03 <0.031 ng/mL    Comment:        NO INDICATION OF MYOCARDIAL INJURY.   Glucose, capillary     Status: Abnormal   Collection  Time: 10/21/15  6:33 AM  Result Value Ref Range   Glucose-Capillary 152 (H) 65 - 99 mg/dL   Comment 1 Notify RN    Comment 2 Document in Chart   Troponin  I-serum (0, 3, 6 hours)     Status: None   Collection Time: 10/21/15  9:37 AM  Result Value Ref Range   Troponin I <0.03 <0.031 ng/mL    Comment:        NO INDICATION OF MYOCARDIAL INJURY.   TSH     Status: None   Collection Time: 10/21/15  9:37 AM  Result Value Ref Range   TSH 3.532 0.350 - 4.500 uIU/mL  C-reactive protein     Status: None   Collection Time: 10/21/15  9:37 AM  Result Value Ref Range   CRP 0.8 <1.0 mg/dL  Comprehensive metabolic panel     Status: Abnormal   Collection Time: 10/21/15  9:57 AM  Result Value Ref Range   Sodium 143 135 - 145 mmol/L   Potassium 3.6 3.5 - 5.1 mmol/L   Chloride 107 101 - 111 mmol/L   CO2 27 22 - 32 mmol/L   Glucose, Bld 82 65 - 99 mg/dL   BUN 10 6 - 20 mg/dL   Creatinine, Ser 0.96 0.61 - 1.24 mg/dL   Calcium 9.2 8.9 - 10.3 mg/dL   Total Protein 6.2 (L) 6.5 - 8.1 g/dL   Albumin 3.5 3.5 - 5.0 g/dL   AST 23 15 - 41 U/L   ALT 15 (L) 17 - 63 U/L   Alkaline Phosphatase 98 38 - 126 U/L   Total Bilirubin 0.7 0.3 - 1.2 mg/dL   GFR calc non Af Amer >60 >60 mL/min   GFR calc Af Amer >60 >60 mL/min    Comment: (NOTE) The eGFR has been calculated using the CKD EPI equation. This calculation has not been validated in all clinical situations. eGFR's persistently <60 mL/min signify possible Chronic Kidney Disease.    Anion gap 9 5 - 15  CK     Status: None   Collection Time: 10/21/15  9:57 AM  Result Value Ref Range   Total CK 205 49 - 397 U/L     Lipid Panel     Component Value Date/Time   CHOL 150 03/03/2014 0630   TRIG 103 03/03/2014 0630   HDL 50 03/03/2014 0630   CHOLHDL 3.0 03/03/2014 0630   VLDL 21 03/03/2014 0630   LDLCALC 79 03/03/2014 0630     Lab Results  Component Value Date   HGBA1C 7.9* 03/04/2014     Lab Results  Component Value Date   LDLCALC 79 03/03/2014   CREATININE 0.96 10/21/2015     HPI :59 year old male with a past medical history significant for hypertension and tobacco abuse; who presents  with a history of substernal chest pain that has waxed and waned over the last 1 week. Patient is a poor historian and basically has multiple seemingly different complaints. States the chest pain symptoms come on with rest and can experience shortness of breath with exertion. Reports having similar symptoms in the past, but doesn't know when exactly. Patient notes associated symptoms of lightheadedness, dry cough, nausea. Patient also complains of right upper quadrant pain cannot tell whether this is related to his chest pain or not. He has not tried anything for the symptoms as he did not know what to take. He denies any diarrhea or recent sexual intercourse in the last 10 years. He asks to  be tested for hepatitis. Complains of bilateral foot pain that comes and goes. Endorses urinary frequency. When questioned about his family history he notes that 6 of his 38 siblings have diabetes.  Initial EKG upon admission showed some T-wave inversions in the lateral that are new from previous EKG. Patient was given aspirin and nitroglycerin with reported resolution of chest pain Initial troponin was negative, blood glucose is 230, and other labs are unremarkable   HOSPITAL COURSE:  Chest pain: Atypical, musculoskeletal EKG shows sinus rhythm with left anterior fascicle block Cardiac enzymes negative 2-D echo results pending at this time CTA chest to rule out pulmonary embolism negative   Leg pain, likely secondary to peripheral vascular disease, venous Doppler negative for  DVT, patient smokes heavily   Right upper quadrant pain: Patient with complaints of right upper quadrant abdominal pain likely points to gallbladder versus liver. Lipase was seen to be within normal limits. -Abdominal ultrasound negative   Hypertension: Uncontrolled -Hydralazine 10 mg every 4 hours when necessary SBP greater than 180 or DBP greater than 110  Urinary frequency - Check UA   Tobacco abuse: Patient continues to  currently smoke at this time -Counseled on the need of smoking cessation   Hyperglycemia: Patient's initial blood glucose is 230 on admission. Suspect that patient has diabetes- Check hemoglobin A1c Hemoglobin A1c pending Patient started on glipizide Suspect patient has peripheral neuropathy and started on gabapentin at bedtime     Rash: Patient states that it itches and has been there for some unknown period of time. Possible psoriasis -Trial of hydrocortisone cream. Acute hepatitis panel pending   Discharge Exam:    Blood pressure 165/93, pulse 74, temperature 98.2 F (36.8 C), temperature source Oral, resp. rate 17, height 5' 10"  (1.778 m), weight 76.7 kg (169 lb 1.5 oz), SpO2 97 %. Constitutional: Vital signs reviewed. Patient is mildly disheveled, but in no acute distress and cooperative with exam. Alert and oriented x3.  Head: Normocephalic and atraumatic  Ear: TM normal bilaterally  Mouth: no erythema or exudates, MMM  Eyes: PERRL, EOMI, conjunctivae normal, No scleral icterus.  Neck: Supple, Trachea midline normal ROM, No JVD, mass, thyromegaly, or carotid bruit present.  Cardiovascular: RRR, S1 normal, S2 normal, no MRG, pulses symmetric and intact bilaterally  Pulmonary/Chest: CTAB, no wheezes, rales, or rhonchi  Abdominal: Soft.Tenderness to palpation of the right upper quadrant possible positive Murphy sign. Bowel sounds positive in all 4 quadrants Extremities l: No joint deformities, erythema, or stiffness, ROM full and no nontender Ext: no edema and no cyanosis, pulses palpable bilaterally (DP and PT)  Hematology: no cervical, inginal, or axillary adenopathy.  Neurological: A&O x3, Strenght is normal and symmetric bilaterally, cranial nerve II-XII are grossly intact, no focal motor deficit, sensory intact to light touch bilaterally.  Skin:Flaky rash of the extensor surfaces of the bilateral upper extremities  Psychiatric: Normal mood and affect. speech and  behavior is normal. Judgment and thought content scattered Cognition and memory are normal.        Signed: Odyssey Vasbinder 10/21/2015, 11:42 AM        Time spent >45 mins

## 2015-10-21 NOTE — ED Notes (Signed)
Attempted report X1

## 2015-10-21 NOTE — ED Notes (Signed)
I-stat trop negative, not crossing over, EDP notified

## 2015-10-21 NOTE — H&P (Signed)
[facility] History and Physical  Mark Graham UJW:[id number] DOB: Mar 02, 1956 DOA: 10/20/2015  Referring physician: ED PCP: No PCP Per Patient   Chief Complaint: Chest pain  HPI:  Mr. Mark Graham is a 59 year old male with a past medical history significant for hypertension and tobacco abuse; who presents with a history of substernal chest pain that has waxed and waned over the last 1 week. Patient is a poor historian and basically has multiple seemingly different complaints.  States the chest pain symptoms come on with rest and can experience shortness of breath with exertion. Reports having similar symptoms in the past, but doesn't know when exactly. Patient notes associated symptoms of lightheadedness, dry cough, nausea. Patient also complains of right upper quadrant pain cannot tell whether this is related to his chest pain or not. He has not tried anything for the symptoms as he did not know what to take. He denies any diarrhea or recent sexual intercourse in the last 10 years. He asks to be tested for hepatitis. Complains of bilateral foot pain that comes and goes. Endorses urinary frequency. When questioned about his family history he notes that 6 of his 13 siblings have diabetes.  Initial EKG upon admission showed some T-wave inversions in the lateral that are new from previous EKG. Patient was given aspirin and nitroglycerin with reported resolution of chest pain Initial troponin was negative, blood glucose is 230, and other labs are unremarkable.   Review of Systems  Constitutional: Negative for chills.  HENT: Positive for congestion. Negative for ear pain.   Eyes: Negative for pain.  Respiratory: Positive for cough and shortness of breath.   Cardiovascular: Positive for chest pain. Negative for leg swelling.  Gastrointestinal: Positive for nausea and abdominal pain. Negative for vomiting.  Genitourinary: Positive for frequency. Negative for urgency.  Musculoskeletal: Positive for  myalgias and joint pain.  Skin: Positive for itching and rash.  Neurological: Positive for dizziness and sensory change. Negative for focal weakness.  Endo/Heme/Allergies: Positive for polydipsia. Does not bruise/bleed easily.  Psychiatric/Behavioral: Negative for suicidal ideas. The patient does not have insomnia.      Past Medical History  Diagnosis Date  . Tobacco abuse   . Hypertension      History reviewed. No pertinent past surgical history.    Social History:  reports that he has been smoking Cigarettes.  He does not have any smokeless tobacco history on file. He reports that he drinks alcohol. He reports that he does not use illicit drugs.   No Known Allergies  Family History  Problem Relation Age of Onset  . Cancer Father   . Cancer Sister         Prior to Admission medications   Medication Sig Start Date End Date Taking? Authorizing Provider  Doxylamine Succinate, Sleep, (SLEEP AID PO) Take 2 tablets by mouth at bedtime as needed (sleep).   Yes Historical Provider, MD     Physical Exam: Filed Vitals:   10/21/15 0215 10/21/15 0230 10/21/15 0300 10/21/15 0315  BP: 163/105 182/104 163/100 151/95  Pulse: 87 75 80 84  Temp:      TempSrc:      Resp:  16  15  Height:      Weight:      SpO2: 95% 95% 95% 93%     Constitutional: Vital signs reviewed. Patient is mildly disheveled, but  in no acute distress and cooperative with exam. Alert and oriented x3.  Head: Normocephalic and atraumatic  Ear: TM normal bilaterally  Mouth: no erythema or exudates, MMM  Eyes: PERRL, EOMI, conjunctivae normal, No scleral icterus.  Neck: Supple, Trachea midline normal ROM, No JVD, mass, thyromegaly, or carotid bruit present.  Cardiovascular: RRR, S1 normal, S2 normal, no MRG, pulses symmetric and intact bilaterally  Pulmonary/Chest: CTAB, no wheezes, rales, or rhonchi  Abdominal: Soft.Tenderness to palpation of the right upper quadrant possible positive Murphy sign. Bowel  sounds positive in all 4 quadrants Extremities l: No joint deformities, erythema, or stiffness, ROM full and no nontender Ext: no edema and no cyanosis, pulses palpable bilaterally (DP and PT)  Hematology: no cervical, inginal, or axillary adenopathy.  Neurological: A&O x3, Strenght is normal and symmetric bilaterally, cranial nerve II-XII are grossly intact, no focal motor deficit, sensory intact to light touch bilaterally.  Skin:Flaky rash of the extensor surfaces of the bilateral upper extremities  Psychiatric: Normal mood and affect. speech and behavior is normal. Judgment and thought content scattered  Cognition and memory are normal.      Data Review   Micro Results No results found for this or any previous visit (from the past 240 hour(s)).  Radiology Reports Dg Chest 2 View  10/21/2015  CLINICAL DATA:  59 year old male with chest pain EXAM: CHEST  2 VIEW COMPARISON:  Chest radiograph dated 03/02/2014 FINDINGS: The heart size and mediastinal contours are within normal limits. Both lungs are clear. The visualized skeletal structures are unremarkable. IMPRESSION: No active cardiopulmonary disease. Electronically Signed   By: Elgie Collard M.D.   On: 10/21/2015 01:49     CBC  Recent Labs Lab 10/21/15 0017  WBC 9.3  HGB 14.6  HCT 41.8  PLT 313  MCV 97.9  MCH 34.2*  MCHC 34.9  RDW 13.8  LYMPHSABS 2.0  MONOABS 0.9  EOSABS 0.1  BASOSABS 0.1    Chemistries   Recent Labs Lab 10/21/15 0017  NA 139  K 4.1  CL 103  CO2 24  GLUCOSE 230*  BUN 9  CREATININE 1.12  CALCIUM 9.4  AST 38  ALT 16*  ALKPHOS 106  BILITOT 1.2   ------------------------------------------------------------------------------------------------------------------ estimated creatinine clearance is 75.1 mL/min (by C-G formula based on Cr of 1.12). ------------------------------------------------------------------------------------------------------------------ No results for input(s): HGBA1C  in the last 72 hours. ------------------------------------------------------------------------------------------------------------------ No results for input(s): CHOL, HDL, LDLCALC, TRIG, CHOLHDL, LDLDIRECT in the last 72 hours. ------------------------------------------------------------------------------------------------------------------ No results for input(s): TSH, T4TOTAL, T3FREE, THYROIDAB in the last 72 hours.  Invalid input(s): FREET3 ------------------------------------------------------------------------------------------------------------------ No results for input(s): VITAMINB12, FOLATE, FERRITIN, TIBC, IRON, RETICCTPCT in the last 72 hours.  Coagulation profile No results for input(s): INR, PROTIME in the last 168 hours.  No results for input(s): DDIMER in the last 72 hours.  Cardiac Enzymes No results for input(s): CKMB, TROPONINI, MYOGLOBIN in the last 168 hours.  Invalid input(s): CK ------------------------------------------------------------------------------------------------------------------ Invalid input(s): POCBNP   CBG: No results for input(s): GLUCAP in the last 168 hours.     EKG: Independently reviewed.Sinus rhythm with T-wave inversions in the lateral leads that are new   Assessment/Plan Principal Problem:    Chest pain: Question if this is truly chest pain versus possibility of indigestion or related to patient's right upper quadrant pain. Patient with a heart score approximately 4. With the initial troponins being negative although patient had EKG changes that may just significant as symptoms appear to have resolved with aspirin and nitroglycerin. - troponins x 3 - Repeat EKG in a.m. - Echocardiogram complete - GI cocktail for indigestion - Morphine when necessary pain   Right upper  quadrant pain: Patient with complaints of right upper quadrant abdominal pain likely points to gallbladder versus liver. Lipase was seen to be within normal  limits. -Abdominal ultrasound    Hypertension: Uncontrolled -Hydralazine 10 mg every 4 hours when necessary SBP greater than 180 or DBP greater than 110  Urinary frequency - Check UA   Tobacco abuse: Patient continues to currently smoke at this time -Counseled on the need of smoking cessation    Hyperglycemia: Patient's initial blood glucose is 230 on admission.  Suspect that caring family history he also has diabetes and this is the cause for his frequent urination and bilateral foot pain - Check hemoglobin A1c - If hemoglobin A1c greater than 6.5 would recommend diabetic education - Sliding-scale insulin with every 4 hours checks for now  Rash: Patient states that it itches and has been there for some unknown period of time. Possible psoriasis -Trial of hydrocortisone cream. -f/u hepatitis panel  Code Status:   full Family Communication: bedside Disposition Plan: admit   Total time spent 55 minutes.Greater than 50% of this time was spent in counseling, explanation of diagnosis, planning of further management, and coordination of care  Clydie BraunRondell A Smith [facility] Pager [phone]  If 7PM-7AM, please contact night-coverage [email or web address] Password [email or web address] 10/21/2015, 3:56 AM

## 2015-10-21 NOTE — ED Notes (Signed)
Pt in EMS reporting feet pain, flank pain, R sided CP. Present for past week. Poor historian.

## 2015-10-21 NOTE — Progress Notes (Signed)
*  PRELIMINARY RESULTS* Echocardiogram 2D Echocardiogram has been performed.  Jeryl Columbialliott, Kyrell Ruacho 10/21/2015, 2:35 PM

## 2015-10-21 NOTE — Progress Notes (Signed)
*  Preliminary Results* Bilateral lower extremity venous duplex completed. Bilateral lower extremities are negative for deep vein thrombosis. There is no evidence of Baker's cyst bilaterally.  10/21/2015  Gertie FeyMichelle Cletis Clack, RVT, RDCS, RDMS

## 2015-10-21 NOTE — Progress Notes (Signed)
Patient arrived from the ED. Patient oriented to room, call bell, and call light.Ccmd called and verified.

## 2015-10-21 NOTE — ED Provider Notes (Signed)
CSN: [id number]     Arrival date & time 10/20/15  2357 History  By signing my name below, I, [name], attest that this documentation has been prepared under the direction and in the presence of Tomasita CrumbleAdeleke Mirca Yale, MD. Electronically Signed: [name], ED Scribe. 10/21/2015. 12:31 AM     Chief Complaint  Patient presents with  . Flank Pain   The history is provided by the patient and the EMS personnel. No language interpreter was used.   Brought in by EMS, Mark Graham is a 59 y.o. male with no significant PMHx who presents to the Emergency Department complaining of right-sided chest pain with onset 1 week ago. He describes the pain as "chest pain is all I can tell you" and notes that he had similar pain in March of an unknown year. Associated symptoms include subjective low-grade fever, dizziness, orthopnea, dry cough, and nausea. He also complains of right flank pain, bilateral foot cramping, and a red rash at the bilateral upper extremities that he associates with poison sumac. Pt denies vomiting. He has no PCP.    History reviewed. No pertinent past medical history. History reviewed. No pertinent past surgical history. Family History  Problem Relation Age of Onset  . Cancer Father   . Cancer Sister    Social History  Substance Use Topics  . Smoking status: Current Every Day Smoker    Types: Cigarettes  . Smokeless tobacco: None  . Alcohol Use: Yes     Comment: drinks when he has any money    Review of Systems 10 Systems reviewed and all are negative for acute change except as noted in the HPI. Allergies  Review of patient's allergies indicates no known allergies.  Home Medications   Prior to Admission medications   Medication Sig Start Date End Date Taking? Authorizing Provider  Doxylamine Succinate, Sleep, (SLEEP AID PO) Take 2 tablets by mouth at bedtime as needed (sleep).   Yes Historical Provider, MD   BP 165/106 mmHg  Pulse 98  Temp(Src) 97.9 F (36.6 C)  (Oral)  Resp 18  Ht 5\' 11"  (1.803 m)  Wt 165 lb (74.844 kg)  BMI 23.02 kg/m2  SpO2 97% Physical Exam  Constitutional: He is oriented to person, place, and time. Vital signs are normal. He appears well-developed and well-nourished.  Non-toxic appearance. He does not appear ill. No distress.  HENT:  Head: Normocephalic and atraumatic.  Nose: Nose normal.  Mouth/Throat: Oropharynx is clear and moist. No oropharyngeal exudate.  Eyes: Conjunctivae and EOM are normal. Pupils are equal, round, and reactive to light. No scleral icterus.  Neck: Normal range of motion. Neck supple. No tracheal deviation, no edema, no erythema and normal range of motion present. No thyroid mass and no thyromegaly present.  Cardiovascular: Normal rate, regular rhythm, S1 normal, S2 normal, normal heart sounds, intact distal pulses and normal pulses.  Exam reveals no gallop and no friction rub.   No murmur heard. Pulmonary/Chest: Effort normal and breath sounds normal. No respiratory distress. He has no wheezes. He has no rhonchi. He has no rales.  Abdominal: Soft. Normal appearance and bowel sounds are normal. He exhibits no distension, no ascites and no mass. There is no hepatosplenomegaly. There is no tenderness. There is no rebound, no guarding and no CVA tenderness.  Musculoskeletal: Normal range of motion. He exhibits no edema or tenderness.  Lymphadenopathy:    He has no cervical adenopathy.  Neurological: He is alert and oriented to person, place, and time.  He has normal strength. No cranial nerve deficit or sensory deficit.  Skin: Skin is warm, dry and intact. Rash (BUE) noted. No petechiae noted. He is not diaphoretic. No erythema. No pallor.  Psychiatric: He has a normal mood and affect. His behavior is normal. Judgment normal.  Nursing note and vitals reviewed.   ED Course  Procedures (including critical care time) DIAGNOSTIC STUDIES: Oxygen Saturation is 97% on RA,  normal by my interpretation.     COORDINATION OF CARE: 12:05 AM Discussed treatment plan which includes lab work, and EKG with pt at bedside and pt agreed to plan.  3:11 AM-Consult complete with Dr. Katrinka Blazing (Hospitalist). Patient case explained and discussed. Agrees to admit patient for further evaluation and treatment. Call ended at 3:12 AM  Labs Review Labs Reviewed  CBC WITH DIFFERENTIAL/PLATELET - Abnormal; Notable for the following:    MCH 34.2 (*)    All other components within normal limits  COMPREHENSIVE METABOLIC PANEL - Abnormal; Notable for the following:    Glucose, Bld 230 (*)    ALT 16 (*)    All other components within normal limits  LIPASE, BLOOD  HEPATITIS PANEL, ACUTE  I-STAT TROPOININ, ED    Imaging Review Dg Chest 2 View  10/21/2015  CLINICAL DATA:  59 year old male with chest pain EXAM: CHEST  2 VIEW COMPARISON:  Chest radiograph dated 03/02/2014 FINDINGS: The heart size and mediastinal contours are within normal limits. Both lungs are clear. The visualized skeletal structures are unremarkable. IMPRESSION: No active cardiopulmonary disease. Electronically Signed   By: Elgie Collard M.D.   On: 10/21/2015 01:49   I have personally reviewed and evaluated these lab results as part of my medical decision-making.   EKG Interpretation   Date/Time:  Monday October 21 2015 00:05:40 EST Ventricular Rate:  98 PR Interval:  147 QRS Duration: 98 QT Interval:  380 QTC Calculation: 485 R Axis:   -76 Text Interpretation:  Sinus rhythm Left anterior fascicular block Abnormal  R-wave progression, early transition new STD lateral leads Confirmed by  Erroll Luna [phone]) on 10/21/2015 2:35:48 AM      MDM   Final diagnoses:  None    Patient presents to the ED for CP, dizziness and SOB.  EKG shows new STD in lateral leads.  He will require admission for further care.  I spoke with Dr. Katrinka Blazing who accepts the patient for admission.   I personally performed the services described in this  documentation, which was scribed in my presence. The recorded information has been reviewed and is accurate.     Tomasita Crumble, MD 10/21/15 [phone]

## 2015-10-22 LAB — HEPATITIS PANEL, ACUTE
HEP B C IGM: NEGATIVE
HEP B S AG: NEGATIVE
Hep A IgM: NEGATIVE

## 2015-10-22 LAB — HEMOGLOBIN A1C
Hgb A1c MFr Bld: 7.1 % — ABNORMAL HIGH (ref 4.8–5.6)
Hgb A1c MFr Bld: 7.1 % — ABNORMAL HIGH (ref 4.8–5.6)
Mean Plasma Glucose: 157 mg/dL
Mean Plasma Glucose: 157 mg/dL

## 2015-12-22 ENCOUNTER — Encounter (HOSPITAL_COMMUNITY): Payer: Self-pay

## 2015-12-22 ENCOUNTER — Emergency Department (HOSPITAL_COMMUNITY): Payer: Self-pay

## 2015-12-22 ENCOUNTER — Emergency Department (HOSPITAL_COMMUNITY)
Admission: EM | Admit: 2015-12-22 | Discharge: 2015-12-23 | Disposition: A | Discharge status: Home / Self Care | Payer: Self-pay | Attending: Emergency Medicine | Admitting: Emergency Medicine

## 2015-12-22 DIAGNOSIS — I10 Essential (primary) hypertension: Secondary | ICD-10-CM | POA: Insufficient documentation

## 2015-12-22 DIAGNOSIS — E119 Type 2 diabetes mellitus without complications: Secondary | ICD-10-CM | POA: Insufficient documentation

## 2015-12-22 DIAGNOSIS — Z79899 Other long term (current) drug therapy: Secondary | ICD-10-CM | POA: Insufficient documentation

## 2015-12-22 DIAGNOSIS — Z7984 Long term (current) use of oral hypoglycemic drugs: Secondary | ICD-10-CM | POA: Insufficient documentation

## 2015-12-22 DIAGNOSIS — F17210 Nicotine dependence, cigarettes, uncomplicated: Secondary | ICD-10-CM | POA: Insufficient documentation

## 2015-12-22 DIAGNOSIS — R300 Dysuria: Secondary | ICD-10-CM | POA: Insufficient documentation

## 2015-12-22 DIAGNOSIS — R079 Chest pain, unspecified: Secondary | ICD-10-CM

## 2015-12-22 LAB — URINALYSIS, ROUTINE W REFLEX MICROSCOPIC
Bilirubin Urine: NEGATIVE
GLUCOSE, UA: NEGATIVE mg/dL
Ketones, ur: NEGATIVE mg/dL
Nitrite: NEGATIVE
Protein, ur: NEGATIVE mg/dL
Specific Gravity, Urine: 1.005 (ref 1.005–1.030)
pH: 5.5 (ref 5.0–8.0)

## 2015-12-22 LAB — CBC
HCT: 39.3 % (ref 39.0–52.0)
HEMOGLOBIN: 13.8 g/dL (ref 13.0–17.0)
MCH: 33.9 pg (ref 26.0–34.0)
MCHC: 35.1 g/dL (ref 30.0–36.0)
MCV: 96.6 fL (ref 78.0–100.0)
PLATELETS: 264 10*3/uL (ref 150–400)
RBC: 4.07 MIL/uL — AB (ref 4.22–5.81)
RDW: 13.9 % (ref 11.5–15.5)
WBC: 8.7 10*3/uL (ref 4.0–10.5)

## 2015-12-22 LAB — I-STAT CG4 LACTIC ACID, ED: LACTIC ACID, VENOUS: 2.34 mmol/L — AB (ref 0.5–2.0)

## 2015-12-22 LAB — URINE MICROSCOPIC-ADD ON

## 2015-12-22 LAB — I-STAT TROPONIN, ED: TROPONIN I, POC: 0 ng/mL (ref 0.00–0.08)

## 2015-12-22 MED ORDER — SODIUM CHLORIDE 0.9 % IV BOLUS (SEPSIS)
1000.0000 mL | Freq: Once | INTRAVENOUS | Status: AC
  Administered 2015-12-22: 1000 mL via INTRAVENOUS

## 2015-12-22 NOTE — ED Provider Notes (Signed)
CSN: [id number]     Arrival date & time 12/22/15  2210 History   First MD Initiated Contact with Patient 12/22/15 2227     Chief Complaint  Patient presents with  . Chest Pain  . Dysuria     (Consider location/radiation/quality/duration/timing/severity/associated sxs/prior Treatment) Patient is a 60 y.o. male presenting with chest pain.  Chest Pain Pain location:  Substernal area Pain quality: dull   Pain radiates to:  Does not radiate Pain severity:  Mild Onset quality:  Gradual Duration:  3 days Timing:  Constant Progression:  Unchanged Chronicity:  Recurrent Context: at rest   Relieved by: Eating. Worsened by:  Nothing tried (Not eating) Ineffective treatments:  None tried Associated symptoms: no abdominal pain, no back pain, no cough, no dizziness, no fatigue, no fever, no headache, no nausea, no orthopnea, no palpitations, no shortness of breath, no syncope, not vomiting and no weakness   Risk factors: diabetes mellitus, hypertension, male sex and smoking     Past Medical History  Diagnosis Date  . Tobacco abuse   . Hypertension   . Diabetes mellitus without complication (HCC)    History reviewed. No pertinent past surgical history. Family History  Problem Relation Age of Onset  . Cancer Father   . Cancer Sister   . Diabetes Mellitus II      States 6 out of 13 of his siblings    Social History  Substance Use Topics  . Smoking status: Current Every Day Smoker    Types: Cigarettes  . Smokeless tobacco: None  . Alcohol Use: Yes     Comment: drinks when he has any money    Review of Systems  Constitutional: Negative for fever, chills, appetite change and fatigue.  HENT: Negative for congestion, ear pain, facial swelling, mouth sores and sore throat.   Eyes: Negative for visual disturbance.  Respiratory: Negative for cough, chest tightness and shortness of breath.   Cardiovascular: Positive for chest pain. Negative for palpitations, orthopnea and syncope.   Gastrointestinal: Negative for nausea, vomiting, abdominal pain, diarrhea and blood in stool.  Endocrine: Negative for cold intolerance and heat intolerance.  Genitourinary: Negative for frequency, decreased urine volume and difficulty urinating.  Musculoskeletal: Negative for back pain and neck stiffness.  Skin: Negative for rash.  Neurological: Negative for dizziness, weakness, light-headedness and headaches.  All other systems reviewed and are negative.     Allergies  Review of patient's allergies indicates no known allergies.  Home Medications   Prior to Admission medications   Medication Sig Start Date End Date Taking? Authorizing Provider  Doxylamine Succinate, Sleep, (SLEEP AID PO) Take 2 tablets by mouth at bedtime as needed (sleep).    Historical Provider, MD  gabapentin (NEURONTIN) 400 MG capsule Take 1 capsule (400 mg total) by mouth at bedtime. 10/21/15   Richarda Overlie, MD  glipiZIDE (GLUCOTROL) 5 MG tablet Take 1 tablet (5 mg total) by mouth daily before breakfast. 10/21/15   Richarda Overlie, MD  nicotine (NICODERM CQ) 21 mg/24hr patch Place 1 patch (21 mg total) onto the skin daily. 10/21/15   Richarda Overlie, MD   BP 140/95 mmHg  Pulse 92  Temp(Src) 98.3 F (36.8 C) (Oral)  Resp 16  Ht  (1.778 m)  SpO2 94% Physical Exam  Constitutional: He is oriented to person, place, and time. He appears well-nourished. No distress.  HENT:  Head: Normocephalic and atraumatic.  Right Ear: External ear normal.  Left Ear: External ear normal.  Eyes: Pupils are equal,  round, and reactive to light. Right eye exhibits no discharge. Left eye exhibits no discharge. No scleral icterus.  Neck: Normal range of motion. Neck supple.  Cardiovascular: Normal rate.  Exam reveals no gallop and no friction rub.   No murmur heard. Pulmonary/Chest: Effort normal and breath sounds normal. No stridor. No respiratory distress. He has no wheezes. He has no rales. He exhibits no tenderness.   Abdominal: Soft. He exhibits no distension and no mass. There is no tenderness. There is no rebound and no guarding.  Musculoskeletal: He exhibits no edema or tenderness.  Neurological: He is alert and oriented to person, place, and time.  Skin: Skin is warm and dry. No rash noted. He is not diaphoretic. No erythema.    ED Course  Procedures (including critical care time) Labs Review Labs Reviewed  I-STAT CG4 LACTIC ACID, ED - Abnormal; Notable for the following:    Lactic Acid, Venous 2.34 (*)    All other components within normal limits  BASIC METABOLIC PANEL  CBC  URINALYSIS, ROUTINE W REFLEX MICROSCOPIC (NOT AT [facility]  I-STAT TROPOININ, ED    Imaging Review No results found. I have personally reviewed and evaluated these images and lab results as part of my medical decision-making.   EKG Interpretation   Date/Time:  Sunday December 22 2015 22:28:53 EST Ventricular Rate:  97 PR Interval:  151 QRS Duration: 100 QT Interval:  377 QTC Calculation: 479 R Axis:   -58 Text Interpretation:  Age not entered, assumed to be  60 years old for  purpose of ECG interpretation Sinus rhythm Left anterior fascicular block  Borderline prolonged QT interval no std noted in lateral leads Otherwise  no significant change Confirmed by FLOYD MD, DANIEL [phone]) on 12/22/2015  10:34:48 PM      MDM   60 year old gentleman with a history of hypertension, diabetes, tobacco abuse, alcohol abuse who presents to the ED with 3 days of persistent substernal mild chest pain. History as above. Of note the patient was admitted recently for similar presentation ruled out for ACS and PE. No recent fevers illnesses or infections. The patient does endorse drinking 4-5 16 ounce cans of beer today. On arrival patient is afebrile with stable vital signs. Obviously intoxicated.  EKG with normal sinus rhythm, left axis deviation with borderline QT intervals. No evidence of acute ischemia or arrhythmia or blocks.  Chest x-ray without evidence of pneumothorax, pneumonia, pneumomediastinum, pleural effusions, pulmonary edema. Mediastinum without evidence to suggest dissection. We'll rule out ACS with delta troponin. Low suspicion for pulmonary embolism.  We'll allow the patient to metabolize to freedom while workup is pending. Patient provided with IV fluids in the interim.  Diagnostic studies interpreted by me and use to my clinical decision-making.  Patient seen in conjunction with Dr. Adela Lank.  Currently patient is pending at delta troponin. If this is negative he is safe for discharge upon metabolizing to freedom. Patient care transferred to Dr. Blinda Leatherwood. Please see his note for further ED course   Drema Pry, MD 12/23/15 0129  Melene Plan, DO 12/23/15 1642

## 2015-12-22 NOTE — ED Notes (Signed)
Patient transported to X-ray 

## 2015-12-22 NOTE — ED Notes (Signed)
Per EMS pt called due to ongoing recurrent chest pain since 2007 and dysuria x 3 years; Pt states pain started yesterday evening and has been constant; pt c/o pain at 1/10 on arrival pt a&o x 4 on arrival; Pt denies drug use bu states he drank 4/5 16oz cans of beer today; Pt has hx of HTN with no med management; Resident at bedside on arrival

## 2015-12-23 LAB — BASIC METABOLIC PANEL
Anion gap: 12 (ref 5–15)
BUN: 7 mg/dL (ref 6–20)
CALCIUM: 8.9 mg/dL (ref 8.9–10.3)
CHLORIDE: 103 mmol/L (ref 101–111)
CO2: 22 mmol/L (ref 22–32)
CREATININE: 0.8 mg/dL (ref 0.61–1.24)
Glucose, Bld: 121 mg/dL — ABNORMAL HIGH (ref 65–99)
Potassium: 3.2 mmol/L — ABNORMAL LOW (ref 3.5–5.1)
SODIUM: 137 mmol/L (ref 135–145)

## 2015-12-23 LAB — I-STAT TROPONIN, ED: TROPONIN I, POC: 0 ng/mL (ref 0.00–0.08)

## 2015-12-23 NOTE — Discharge Instructions (Signed)
Nonspecific Chest Pain  °Chest pain can be caused by many different conditions. There is always a chance that your pain could be related to something serious, such as a heart attack or a blood clot in your lungs. Chest pain can also be caused by conditions that are not life-threatening. If you have chest pain, it is very important to follow up with your health care provider. °CAUSES  °Chest pain can be caused by: °· Heartburn. °· Pneumonia or bronchitis. °· Anxiety or stress. °· Inflammation around your heart (pericarditis) or lung (pleuritis or pleurisy). °· A blood clot in your lung. °· A collapsed lung (pneumothorax). It can develop suddenly on its own (spontaneous pneumothorax) or from trauma to the chest. °· Shingles infection (varicella-zoster virus). °· Heart attack. °· Damage to the bones, muscles, and cartilage that make up your chest wall. This can include: °¨ Bruised bones due to injury. °¨ Strained muscles or cartilage due to frequent or repeated coughing or overwork. °¨ Fracture to one or more ribs. °¨ Sore cartilage due to inflammation (costochondritis). °RISK FACTORS  °Risk factors for chest pain may include: °· Activities that increase your risk for trauma or injury to your chest. °· Respiratory infections or conditions that cause frequent coughing. °· Medical conditions or overeating that can cause heartburn. °· Heart disease or family history of heart disease. °· Conditions or health behaviors that increase your risk of developing a blood clot. °· Having had chicken pox (varicella zoster). °SIGNS AND SYMPTOMS °Chest pain can feel like: °· Burning or tingling on the surface of your chest or deep in your chest. °· Crushing, pressure, aching, or squeezing pain. °· Dull or sharp pain that is worse when you move, cough, or take a deep breath. °· Pain that is also felt in your back, neck, shoulder, or arm, or pain that spreads to any of these areas. °Your chest pain may come and go, or it may stay  constant. °DIAGNOSIS °Lab tests or other studies may be needed to find the cause of your pain. Your health care provider may have you take a test called an ambulatory ECG (electrocardiogram). An ECG records your heartbeat patterns at the time the test is performed. You may also have other tests, such as: °· Transthoracic echocardiogram (TTE). During echocardiography, sound waves are used to create a picture of all of the heart structures and to look at how blood flows through your heart. °· Transesophageal echocardiogram (TEE). This is a more advanced imaging test that obtains images from inside your body. It allows your health care provider to see your heart in finer detail. °· Cardiac monitoring. This allows your health care provider to monitor your heart rate and rhythm in real time. °· Holter monitor. This is a portable device that records your heartbeat and can help to diagnose abnormal heartbeats. It allows your health care provider to track your heart activity for several days, if needed. °· Stress tests. These can be done through exercise or by taking medicine that makes your heart beat more quickly. °· Blood tests. °· Imaging tests. °TREATMENT  °Your treatment depends on what is causing your chest pain. Treatment may include: °· Medicines. These may include: °¨ Acid blockers for heartburn. °¨ Anti-inflammatory medicine. °¨ Pain medicine for inflammatory conditions. °¨ Antibiotic medicine, if an infection is present. °¨ Medicines to dissolve blood clots. °¨ Medicines to treat coronary artery disease. °· Supportive care for conditions that do not require medicines. This may include: °¨ Resting. °¨ Applying heat   or cold packs to injured areas. °¨ Limiting activities until pain decreases. °HOME CARE INSTRUCTIONS °· If you were prescribed an antibiotic medicine, finish it all even if you start to feel better. °· Avoid any activities that bring on chest pain. °· Do not use any tobacco products, including  cigarettes, chewing tobacco, or electronic cigarettes. If you need help quitting, ask your health care provider. °· Do not drink alcohol. °· Take medicines only as directed by your health care provider. °· Keep all follow-up visits as directed by your health care provider. This is important. This includes any further testing if your chest pain does not go away. °· If heartburn is the cause for your chest pain, you may be told to keep your head raised (elevated) while sleeping. This reduces the chance that acid will go from your stomach into your esophagus. °· Make lifestyle changes as directed by your health care provider. These may include: °¨ Getting regular exercise. Ask your health care provider to suggest some activities that are safe for you. °¨ Eating a heart-healthy diet. A registered dietitian can help you to learn healthy eating options. °¨ Maintaining a healthy weight. °¨ Managing diabetes, if necessary. °¨ Reducing stress. °SEEK MEDICAL CARE IF: °· Your chest pain does not go away after treatment. °· You have a rash with blisters on your chest. °· You have a fever. °SEEK IMMEDIATE MEDICAL CARE IF:  °· Your chest pain is worse. °· You have an increasing cough, or you cough up blood. °· You have severe abdominal pain. °· You have severe weakness. °· You faint. °· You have chills. °· You have sudden, unexplained chest discomfort. °· You have sudden, unexplained discomfort in your arms, back, neck, or jaw. °· You have shortness of breath at any time. °· You suddenly start to sweat, or your skin gets clammy. °· You feel nauseous or you vomit. °· You suddenly feel light-headed or dizzy. °· Your heart begins to beat quickly, or it feels like it is skipping beats. °These symptoms may represent a serious problem that is an emergency. Do not wait to see if the symptoms will go away. Get medical help right away. Call your local emergency services (911 in the [place] Do not drive yourself to the hospital. °  °This  information is not intended to replace advice given to you by your health care provider. Make sure you discuss any questions you have with your health care provider. °  °Document Released: 07/22/2005 Document Revised: 11/02/2014 Document Reviewed: 05/18/2014 °Elsevier Interactive Patient Education ©2016 [employer] ° °

## 2015-12-23 NOTE — ED Provider Notes (Signed)
Patient signed out to me if second troponin pending. Patient had been evaluated for atypical chest pain earlier tonight. EKG unchanged, initial troponin was negative. Patient's second troponin has returned and is normal. Patient is appropriate for discharge and outpatient follow-up.  Gilda Crease, MD 12/23/15 [phone]

## 2016-01-02 NOTE — Progress Notes (Signed)
This encounter was created in error - please disregard.

## 2016-01-03 ENCOUNTER — Encounter: Payer: Self-pay | Admitting: Cardiology

## 2016-01-09 ENCOUNTER — Encounter: Payer: Self-pay | Admitting: Cardiology

## 2016-04-01 ENCOUNTER — Emergency Department (HOSPITAL_COMMUNITY)
Admission: EM | Admit: 2016-04-01 | Discharge: 2016-04-02 | Disposition: A | Discharge status: Home / Self Care | Payer: Self-pay | Attending: Emergency Medicine | Admitting: Emergency Medicine

## 2016-04-01 DIAGNOSIS — I10 Essential (primary) hypertension: Secondary | ICD-10-CM | POA: Insufficient documentation

## 2016-04-01 DIAGNOSIS — R079 Chest pain, unspecified: Secondary | ICD-10-CM | POA: Insufficient documentation

## 2016-04-01 DIAGNOSIS — F17210 Nicotine dependence, cigarettes, uncomplicated: Secondary | ICD-10-CM | POA: Insufficient documentation

## 2016-04-01 DIAGNOSIS — E119 Type 2 diabetes mellitus without complications: Secondary | ICD-10-CM | POA: Insufficient documentation

## 2016-04-02 ENCOUNTER — Encounter (HOSPITAL_COMMUNITY): Payer: Self-pay | Admitting: Emergency Medicine

## 2016-04-02 NOTE — ED Notes (Signed)
Pt presents from home with [facility] with CP intermittently x "several years"; pt reports pain worse in the last week; hx of HTN and noncompliance; pt interrupting RN frequently during assessment and changing subject to politics, religion, and conspiracy theory; pt denies pain at this time

## 2016-04-02 NOTE — ED Provider Notes (Addendum)
12:20 AM  Pt here for chest pain. Has told nursing staff that he is now asymptomatic. No longer wants evaluation. I have square weekly spoken to patient and he refuses any further evaluation and refuses to go back into his room. Does not appear intoxicated. His ambulate without difficulty, normal speech. No complaints of psychiatric concern. Feel he has the capacity to make this decision for himself. He is aware there could be life-threatening illness that we have not fully evaluated in the emergency department without further workup. He states he understands this. We'll have him sign an AMA paperwork. He refused to allow me to obtain history, physical exam.  Mark MawKristen N Kaliana Albino, DO 04/02/16 0021    EKG Interpretation  Date/Time:  Wednesday April 01 2016 23:58:50 EDT Ventricular Rate:  93 PR Interval:  144 QRS Duration: 102 QT Interval:  389 QTC Calculation: 484 R Axis:   -63 Text Interpretation:  Sinus rhythm Left anterior fascicular block Borderline ST elevation, lateral leads Borderline prolonged QT interval No significant change since last tracing Confirmed by Malvika Tung,  DO, Reona Zendejas [phone] on 04/02/2016 12:03:47 AM        Mark MawKristen N Mitsuru Dault, DO 04/02/16 0022

## 2016-04-02 NOTE — ED Notes (Signed)
Pt removed IV and stating he wants to "sign out"; Dr. Elesa MassedWard saw patient and requested to evaluate patient for life threatening illness however patient refusing further exam; pt signed AMA form and verbalized understanding of risk of leaving AMA; pt escorted to lobby with RN

## 2016-04-02 NOTE — ED Notes (Signed)
RN ambulated patient to lobby- steady gait noted;

## 2016-12-12 ENCOUNTER — Inpatient Hospital Stay (HOSPITAL_COMMUNITY)
Admission: EM | Admit: 2016-12-12 | Discharge: 2016-12-14 | DRG: 640 | Disposition: A | Discharge status: Home / Self Care | Payer: Self-pay | Attending: Internal Medicine | Admitting: Internal Medicine

## 2016-12-12 ENCOUNTER — Encounter (HOSPITAL_COMMUNITY): Payer: Self-pay | Admitting: Emergency Medicine

## 2016-12-12 ENCOUNTER — Emergency Department (HOSPITAL_COMMUNITY): Payer: Self-pay

## 2016-12-12 DIAGNOSIS — F29 Unspecified psychosis not due to a substance or known physiological condition: Secondary | ICD-10-CM | POA: Diagnosis present

## 2016-12-12 DIAGNOSIS — F17210 Nicotine dependence, cigarettes, uncomplicated: Secondary | ICD-10-CM | POA: Diagnosis present

## 2016-12-12 DIAGNOSIS — E118 Type 2 diabetes mellitus with unspecified complications: Secondary | ICD-10-CM

## 2016-12-12 DIAGNOSIS — R6250 Unspecified lack of expected normal physiological development in childhood: Secondary | ICD-10-CM | POA: Diagnosis present

## 2016-12-12 DIAGNOSIS — R972 Elevated prostate specific antigen [PSA]: Secondary | ICD-10-CM | POA: Diagnosis present

## 2016-12-12 DIAGNOSIS — I10 Essential (primary) hypertension: Secondary | ICD-10-CM | POA: Diagnosis present

## 2016-12-12 DIAGNOSIS — N132 Hydronephrosis with renal and ureteral calculous obstruction: Secondary | ICD-10-CM | POA: Diagnosis present

## 2016-12-12 DIAGNOSIS — E871 Hypo-osmolality and hyponatremia: Principal | ICD-10-CM | POA: Diagnosis present

## 2016-12-12 DIAGNOSIS — R338 Other retention of urine: Secondary | ICD-10-CM

## 2016-12-12 DIAGNOSIS — R17 Unspecified jaundice: Secondary | ICD-10-CM | POA: Diagnosis present

## 2016-12-12 DIAGNOSIS — F1010 Alcohol abuse, uncomplicated: Secondary | ICD-10-CM | POA: Diagnosis present

## 2016-12-12 DIAGNOSIS — G9341 Metabolic encephalopathy: Secondary | ICD-10-CM | POA: Diagnosis present

## 2016-12-12 DIAGNOSIS — E1142 Type 2 diabetes mellitus with diabetic polyneuropathy: Secondary | ICD-10-CM | POA: Diagnosis present

## 2016-12-12 DIAGNOSIS — Z23 Encounter for immunization: Secondary | ICD-10-CM

## 2016-12-12 DIAGNOSIS — R339 Retention of urine, unspecified: Secondary | ICD-10-CM | POA: Diagnosis present

## 2016-12-12 DIAGNOSIS — R9431 Abnormal electrocardiogram [ECG] [EKG]: Secondary | ICD-10-CM | POA: Diagnosis present

## 2016-12-12 DIAGNOSIS — R262 Difficulty in walking, not elsewhere classified: Secondary | ICD-10-CM

## 2016-12-12 DIAGNOSIS — E538 Deficiency of other specified B group vitamins: Secondary | ICD-10-CM | POA: Diagnosis present

## 2016-12-12 DIAGNOSIS — Z590 Homelessness: Secondary | ICD-10-CM

## 2016-12-12 DIAGNOSIS — F28 Other psychotic disorder not due to a substance or known physiological condition: Secondary | ICD-10-CM

## 2016-12-12 DIAGNOSIS — R634 Abnormal weight loss: Secondary | ICD-10-CM | POA: Diagnosis present

## 2016-12-12 DIAGNOSIS — R222 Localized swelling, mass and lump, trunk: Secondary | ICD-10-CM

## 2016-12-12 DIAGNOSIS — Z833 Family history of diabetes mellitus: Secondary | ICD-10-CM

## 2016-12-12 DIAGNOSIS — Z720 Tobacco use: Secondary | ICD-10-CM

## 2016-12-12 DIAGNOSIS — E1140 Type 2 diabetes mellitus with diabetic neuropathy, unspecified: Secondary | ICD-10-CM

## 2016-12-12 LAB — COMPREHENSIVE METABOLIC PANEL
ALK PHOS: 102 U/L (ref 38–126)
ALT: 15 U/L — ABNORMAL LOW (ref 17–63)
ANION GAP: 11 (ref 5–15)
AST: 24 U/L (ref 15–41)
Albumin: 3.8 g/dL (ref 3.5–5.0)
BILIRUBIN TOTAL: 1.2 mg/dL (ref 0.3–1.2)
BUN: 5 mg/dL — ABNORMAL LOW (ref 6–20)
CALCIUM: 8.8 mg/dL — AB (ref 8.9–10.3)
CO2: 23 mmol/L (ref 22–32)
Chloride: 90 mmol/L — ABNORMAL LOW (ref 101–111)
Creatinine, Ser: 0.73 mg/dL (ref 0.61–1.24)
GFR calc non Af Amer: >60 mL/min (ref >60)
Glucose, Bld: 169 mg/dL — ABNORMAL HIGH (ref 65–99)
POTASSIUM: 3.7 mmol/L (ref 3.5–5.1)
SODIUM: 124 mmol/L — AB (ref 135–145)
TOTAL PROTEIN: 6.5 g/dL (ref 6.5–8.1)

## 2016-12-12 LAB — CBC
HEMATOCRIT: 38.5 % — AB (ref 39.0–52.0)
Hemoglobin: 13.7 g/dL (ref 13.0–17.0)
MCH: 32.9 pg (ref 26.0–34.0)
MCHC: 35.6 g/dL (ref 30.0–36.0)
MCV: 92.3 fL (ref 78.0–100.0)
Platelets: 249 10*3/uL (ref 150–400)
RBC: 4.17 MIL/uL — AB (ref 4.22–5.81)
RDW: 12.5 % (ref 11.5–15.5)
WBC: 9.7 10*3/uL (ref 4.0–10.5)

## 2016-12-12 LAB — URINALYSIS, ROUTINE W REFLEX MICROSCOPIC
BACTERIA UA: NONE SEEN
BILIRUBIN URINE: NEGATIVE
Glucose, UA: 150 mg/dL — AB
KETONES UR: 5 mg/dL — AB
NITRITE: NEGATIVE
PH: 7 (ref 5.0–8.0)
Protein, ur: NEGATIVE mg/dL
Specific Gravity, Urine: 1.01 (ref 1.005–1.030)
Squamous Epithelial / LPF: NONE SEEN

## 2016-12-12 LAB — DIFFERENTIAL
BASOS ABS: 0 10*3/uL (ref 0.0–0.1)
Basophils Relative: 0 %
EOS ABS: 0 10*3/uL (ref 0.0–0.7)
Eosinophils Relative: 0 %
LYMPHS ABS: 1 10*3/uL (ref 0.7–4.0)
LYMPHS PCT: 11 %
MONOS PCT: 7 %
Monocytes Absolute: 0.6 10*3/uL (ref 0.1–1.0)
NEUTROS ABS: 7.7 10*3/uL (ref 1.7–7.7)
NEUTROS PCT: 82 %

## 2016-12-12 LAB — SALICYLATE LEVEL: Salicylate Lvl: <7 mg/dL (ref 2.8–30.0)

## 2016-12-12 LAB — ACETAMINOPHEN LEVEL: Acetaminophen (Tylenol), Serum: <10 ug/mL — ABNORMAL LOW (ref 10–30)

## 2016-12-12 LAB — ETHANOL

## 2016-12-12 MED ORDER — SODIUM CHLORIDE 0.9 % IV SOLN
Freq: Once | INTRAVENOUS | Status: AC
  Administered 2016-12-12: via INTRAVENOUS

## 2016-12-12 NOTE — ED Provider Notes (Signed)
Medical screening examination/treatment/procedure(s) were conducted as a shared visit with non-physician practitioner(s) and myself.  I personally evaluated the patient during the encounter.   EKG Interpretation None      61 year old male who presents with u61rinary retention. He has a history of diabetes and hypertension.  Difficult to obtain history, as he is very tangential, does not answer questions appropriately. When redirected multiple times while answer simple questions. Is oriented to person, time and place but is not appear to be oriented to situation. Grossly neuro intact. No prior psychiatric history. Previously documented physical exam does not show these findings.  He does have 1 L of urine retaining on bladder scan and Foley catheter is placed. AMS workup is initiated, and he is noted to have hyponatremia 124, which could explain his mental status changes today. CT head visualized and does not show acute cranial processes. UA without signs of infection.  Will start on continuous NS infusion. Admit for ongoing treatment and monitoring.   CRITICAL CARE Performed by: Lavera Guiseana Duo Jevin Camino   Total critical care time: 35 minutes  Critical care time was exclusive of separately billable procedures and treating other patients.  Critical care was necessary to treat or prevent imminent or life-threatening deterioration.  Critical care was time spent personally by me on the following activities: development of treatment plan with patient and/or surrogate as well as nursing, discussions with consultants, evaluation of patient's response to treatment, examination of patient, obtaining history from patient or surrogate, ordering and performing treatments and interventions, ordering and review of laboratory studies, ordering and review of radiographic studies, pulse oximetry and re-evaluation of patient's condition.    Lavera Guiseana Duo Markiah Janeway, MD 12/12/16 [phone]

## 2016-12-12 NOTE — ED Triage Notes (Signed)
Pt. Stated, I need to know how to stick the thing up me to pee.I know when I pee , look at me, (pt. Had peed on himself) this has been going on for the last few years.

## 2016-12-12 NOTE — H&P (Signed)
Mark Graham:[id number] DOB: 08/27/1956 DOA: 12/12/2016     PCP: No PCP Per Patient   Outpatient Specialists:none Patient coming from:    home Lives alone,       Chief Complaint: lower abdominal pain  HPI: JAKEB Graham is a 61 y.o. male with medical history significant of hypertension and tobacco abuse, DM 2 with peripheral neuropathy, homelessness,     Presented with with complaints of difficulty with urination. He have not had an appetite for few days. He has not had anything to eat for 48 h. He drank some coffee and some water before he had to turn off the tab since his house got no central heat. He have had some beers in AM. HE has long standing hx of Urinary retention, homelessness, being poor hystorian. HE presented to ER stating "I don't know how to put that thing in me to make me pee" He was noted to be soiled with urine and some blood. Patient may have some developmental dela at base line unable to provide hx. Very tangential has flight of ideas refers to [employer] and [address] speaking to him describing in political situation. In the past he has admitted to binge drinking EtOH states he drinks water he can get his hands on that financially hasn't been able to buy much states she lives on $100 a month he has been losing weight. Continues to smoke. Reports favors to his right side when walks although I am unable to elucidate any weakness on the right.    Regarding pertinent Chronic problems: Urinary retention was noted on admission 2016.    IN ER:  Temp (24hrs), Avg:97.6 F (36.4 C), Min:97.6 F (36.4 C), Max:97.6 F (36.4 C)     Bladder scan noted for 1L of retained urine, foley placed.  Na 124 which is new his lowest sodium was 131 in May 2015 CT head negative  Following Medications were ordered in ER: Medications  0.9 %  sodium chloride infusion ( Intravenous New Bag/Given 12/12/16 2346)       Hospitalist was called for admission for Hyponatremia  Review of  Systems:    Pertinent positives include: confusion,  abdominal pain,   Constitutional:  No weight loss, night sweats, Fevers, chills, fatigue, weight loss  HEENT:  No headaches, Difficulty swallowing,Tooth/dental problems,Sore throat,  No sneezing, itching, ear ache, nasal congestion, post nasal drip,  Cardio-vascular:  No chest pain, Orthopnea, PND, anasarca, dizziness, palpitations.no Bilateral lower extremity swelling  GI:  No heartburn, indigestion,nausea, vomiting, diarrhea, change in bowel habits, loss of appetite, melena, blood in stool, hematemesis Resp:  no shortness of breath at rest. No dyspnea on exertion, No excess mucus, no productive cough, No non-productive cough, No coughing up of blood.No change in color of mucus.No wheezing. Skin:  no rash or lesions. No jaundice GU:  no dysuria, change in color of urine, no urgency or frequency. No straining to urinate.  No flank pain.  Musculoskeletal:  No joint pain or no joint swelling. No decreased range of motion. No back pain.  Psych:  No change in mood or affect. No depression or anxiety. No memory loss.  Neuro: no localizing neurological complaints, no tingling, no weakness, no double vision, no gait abnormality, no slurred speech, no confusion  As per HPI otherwise 10 point review of systems negative.   Past Medical History: Past Medical History:  Diagnosis Date  . Diabetes mellitus without complication (HCC)   . Hypertension   .  Tobacco abuse    History reviewed. No pertinent surgical history.   Social History:  Ambulatory  independently      reports that he has been smoking Cigarettes.  He has been smoking about 1.00 pack per day. He uses smokeless tobacco. He reports that he drinks alcohol. He reports that he does not use drugs.  Allergies:  No Known Allergies     Family History:   Family History  Problem Relation Age of Onset  . Cancer Father   . Cancer Sister   . Diabetes Mellitus II       States 6 out of 13 of his siblings     Medications: Prior to Admission medications   Medication Sig Start Date End Date Taking? Authorizing Provider  gabapentin (NEURONTIN) 400 MG capsule Take 1 capsule (400 mg total) by mouth at bedtime. Patient not taking: Reported on 12/22/2015 10/21/15   Richarda Overlie, MD  glipiZIDE (GLUCOTROL) 5 MG tablet Take 1 tablet (5 mg total) by mouth daily before breakfast. Patient not taking: Reported on 12/22/2015 10/21/15   Richarda Overlie, MD  nicotine (NICODERM CQ) 21 mg/24hr patch Place 1 patch (21 mg total) onto the skin daily. Patient not taking: Reported on 12/22/2015 10/21/15   Richarda Overlie, MD    Physical Exam: Patient Vitals for the past 24 hrs:  BP Temp Temp src Pulse Resp SpO2  12/12/16 2315 156/92 - - 85 20 96 %  12/12/16 2100 167/87 - - 93 20 94 %  12/12/16 1856 (!) 199/106 - - 101 20 96 %  12/12/16 1527 (!) 170/115 97.6 F (36.4 C) Oral 109 18 100 %    1. General:  in No Acute distress 2. Psychological: Alert but not Oriented to situation 3. Head/ENT:     Dry Mucous Membranes                          Head Non traumatic, neck supple                            Poor Dentition 4. SKIN:   decreased Skin turgor,  Skin clean Dry and intact no rash, there is a nodularity over spine noted which is nontender but appears to be somewhat solid 5. Heart: Regular rate and rhythm no  Murmur, Rub or gallop 6. Lungs:  no wheezes Diminished bilaterally 7. Abdomen: Soft,  non-tender, Non distended foley in place 8. Lower extremities: no clubbing, cyanosis, or edema 9. Neurologically Strength 5 out of 5 in all 4 extremities cranial nerves II through XII intact 10. MSK: Normal range of motion   body mass index is unknown because there is no height or weight on file.  Labs on Admission:   Labs on Admission: I have personally reviewed following labs and imaging studies  CBC:  Recent Labs Lab 12/12/16 2128  WBC 9.7  NEUTROABS 7.7  HGB 13.7  HCT 38.5*    MCV 92.3  PLT 249   Basic Metabolic Panel:  Recent Labs Lab 12/12/16 2128  NA 124*  K 3.7  CL 90*  CO2 23  GLUCOSE 169*  BUN 5*  CREATININE 0.73  CALCIUM 8.8*   GFR: CrCl cannot be calculated (Unknown ideal weight.). Liver Function Tests:  Recent Labs Lab 12/12/16 2128  AST 24  ALT 15*  ALKPHOS 102  BILITOT 1.2  PROT 6.5  ALBUMIN 3.8   No results for input(s): LIPASE, AMYLASE  in the last 168 hours. No results for input(s): AMMONIA in the last 168 hours. Coagulation Profile: No results for input(s): INR, PROTIME in the last 168 hours. Cardiac Enzymes: No results for input(s): CKTOTAL, CKMB, CKMBINDEX, TROPONINI in the last 168 hours. BNP (last 3 results) No results for input(s): PROBNP in the last 8760 hours. HbA1C: No results for input(s): HGBA1C in the last 72 hours. CBG: No results for input(s): GLUCAP in the last 168 hours. Lipid Profile: No results for input(s): CHOL, HDL, LDLCALC, TRIG, CHOLHDL, LDLDIRECT in the last 72 hours. Thyroid Function Tests: No results for input(s): TSH, T4TOTAL, FREET4, T3FREE, THYROIDAB in the last 72 hours. Anemia Panel: No results for input(s): VITAMINB12, FOLATE, FERRITIN, TIBC, IRON, RETICCTPCT in the last 72 hours. Urine analysis:    Component Value Date/Time   COLORURINE YELLOW 12/12/2016 1545   APPEARANCEUR CLEAR 12/12/2016 1545   LABSPEC 1.010 12/12/2016 1545   PHURINE 7.0 12/12/2016 1545   GLUCOSEU 150 (A) 12/12/2016 1545   HGBUR SMALL (A) 12/12/2016 1545   BILIRUBINUR NEGATIVE 12/12/2016 1545   KETONESUR 5 (A) 12/12/2016 1545   PROTEINUR NEGATIVE 12/12/2016 1545   UROBILINOGEN 1.0 01/15/2015 1946   NITRITE NEGATIVE 12/12/2016 1545   LEUKOCYTESUR SMALL (A) 12/12/2016 1545   Sepsis Labs: @LABRCNTIP (procalcitonin:4,lacticidven:4) )No results found for this or any previous visit (from the past 240 hour(s)).    UA  no evidence of UTI   Lab Results  Component Value Date   HGBA1C 7.1 (H) 10/21/2015     CrCl cannot be calculated (Unknown ideal weight.).  BNP (last 3 results) No results for input(s): PROBNP in the last 8760 hours.   ECG REPORT  Independently reviewed Rate: 77  Rhythm: Sinus rhythm with LVH ST&T Change: No change from baseline abnormal ST segments QTC 477  There were no vitals filed for this visit.   Cultures:    Component Value Date/Time   SDES URINE, CLEAN CATCH 01/15/2015 1946   SPECREQUEST NONE 01/15/2015 1946   CULT NO GROWTH Performed at [facility]  01/15/2015 1946   REPTSTATUS 01/17/2015 FINAL 01/15/2015 1946     Radiological Exams on Admission: Ct Head Wo Contrast  Result Date: 12/12/2016 CLINICAL DATA:  61 year old male with altered mental status and confusion. EXAM: CT HEAD WITHOUT CONTRAST TECHNIQUE: Contiguous axial images were obtained from the base of the skull through the vertex without intravenous contrast. COMPARISON:  Head CT 01/15/2015. FINDINGS: Brain: Patchy areas of mild decreased attenuation are noted throughout the deep and periventricular white matter of the cerebral hemispheres bilaterally, compatible with mild chronic microvascular ischemic disease. No evidence of acute infarction, hemorrhage, hydrocephalus, extra-axial collection or mass lesion/mass effect. Vascular: No hyperdense vessel or unexpected calcification. Skull: Normal. Negative for fracture or focal lesion. Sinuses/Orbits: No acute finding. Other: None. IMPRESSION: 1. No acute intracranial abnormalities. 2. Mild chronic microvascular ischemic changes in the cerebral white matter, as above. Electronically Signed   By: Trudie Reed M.D.   On: 12/12/2016 22:28    Chart has been reviewed    Assessment/Plan  61 y.o. male with medical history significant of hypertension and tobacco abuse, DM 2 with peripheral neuropathy, homelessness, admitted for hyponatremia andd urinary retention  Present on Admission: . Acute urinary retention Foley in Place, start Flomax  will need to follow-up if urology  . Tobacco abuse spoke about importance of quitting tobacco cessation protocol ordered . Hyponatremia patient with poor by mouth intake. We'll order urine electrolytes. Check TSH, given weight loss tobacco abuse obtain  chest x-ray. He would benefit from father evaluation for malignancy. Will obtain serial sodium values . Psychosis - P review of records patient may have had symptoms in the past. Will appreciate behavioral health consult . Weight loss - psychosocial etiology versus malignancy will need further workup . Abnormal ECG - cycle cardiac enzymes obtain echogram patient does not seem to endorse chest pain but difficult to obtain history from Diabetes mellitus with probable neuropathy we'll check hemoglobin A1c with sliding scale. Thoracic nodule - possibly lipoma but given somewhat firm texture and risk for malignancy would evaluate for any bony abnormalities start with thoracic plain films . Hypertension -long standing but suspect untreated initiate Norvasc would avoid hydrochlorothiazide given hyponatremia Other plan as per orders.  DVT prophylaxis:  SCD   Code Status:  FULL CODE   as per patient    Family Communication:   Family not at  Bedside    Disposition Plan:     likely will need placement for rehabilitation                                                   Would benefit from PT/OT eval prior to DC  ordered                       Social Work   Nutrition    consulted                          Consults called: Behavioral Health  Admission status:    inpatient     Level of care     tele            I have spent a total of 57 min on this admission   Amaia Lavallie 12/13/2016, 1:00 AM    [facility]  Pager [phone]   after 2 AM please page floor coverage PA If 7AM-7PM, please contact the day team taking care of the patient  [email or web address]  Password [email or web address]

## 2016-12-12 NOTE — ED Notes (Signed)
Pt. Continuously coming out into hallway stating that he is waiting on the doc. Pt. States if the doc doesn't come soon he's Clelia Schaumanngonna 'die in here'. Pt. Redirected and asked to please stay in his room and the doctor will be in as soon as they can.

## 2016-12-12 NOTE — ED Notes (Signed)
Bleeding noted to washcloth after patient cleaned up. Pt. Unable to tell RN if blood was from penis or rectum. EDP made aware.

## 2016-12-12 NOTE — ED Notes (Signed)
Patient transported to CT 

## 2016-12-12 NOTE — ED Provider Notes (Signed)
[facility]-EMERGENCY DEPT Provider Note   CSN: [id number] Arrival date & time: 12/12/16  1520     History   Chief Complaint Chief Complaint  Patient presents with  . Hypertension  . Urinary Frequency    HPI Mark Graham is a 61 y.o. male with hx of HTN, DM uncontrolled presents today with urinary retention.   Patient is poor historian and difficult to obtain adequate history.   Level 5 caveat.   The history is provided by the patient. The history is limited by the condition of the patient and a developmental delay. No language interpreter was used.    Past Medical History:  Diagnosis Date  . Diabetes mellitus without complication (HCC)   . Hypertension   . Tobacco abuse     Patient Active Problem List   Diagnosis Date Noted  . Psychosis 12/13/2016  . Diabetes mellitus (HCC) 12/12/2016  . Tobacco abuse 10/21/2015  . Hyperglycemia 10/21/2015  . Urinary frequency 10/21/2015  . RUQ abdominal pain 10/21/2015  . Hypertension 03/03/2014  . Hyponatremia 03/03/2014  . Chest pain 03/03/2014  . Leukocytosis 03/03/2014  . Acute urinary retention 03/03/2014    History reviewed. No pertinent surgical history.     Home Medications    Prior to Admission medications   Medication Sig Start Date End Date Taking? Authorizing Provider  gabapentin (NEURONTIN) 400 MG capsule Take 1 capsule (400 mg total) by mouth at bedtime. Patient not taking: Reported on 12/22/2015 10/21/15   Richarda OverlieNayana Abrol, MD  glipiZIDE (GLUCOTROL) 5 MG tablet Take 1 tablet (5 mg total) by mouth daily before breakfast. Patient not taking: Reported on 12/22/2015 10/21/15   Richarda OverlieNayana Abrol, MD  nicotine (NICODERM CQ) 21 mg/24hr patch Place 1 patch (21 mg total) onto the skin daily. Patient not taking: Reported on 12/22/2015 10/21/15   Richarda OverlieNayana Abrol, MD    Family History Family History  Problem Relation Age of Onset  . Cancer Father   . Cancer Sister   . Diabetes Mellitus II      States 6 out of 13 of his siblings       Social History Social History  Substance Use Topics  . Smoking status: Current Every Day Smoker    Packs/day: 1.00    Types: Cigarettes  . Smokeless tobacco: Current User  . Alcohol use Yes     Comment: drinks when he has any money     Allergies   Patient has no known allergies.   Review of Systems Review of Systems  Unable to perform ROS: Psychiatric disorder     Physical Exam Updated Vital Signs BP 156/92   Pulse 85   Temp 97.6 F (36.4 C) (Oral)   Resp 20   SpO2 96%   Physical Exam  Constitutional: He is oriented to person, place, and time. He appears well-developed and well-nourished.  Well appearing  HENT:  Head: Normocephalic and atraumatic.  Nose: Nose normal.  Mouth/Throat: Oropharynx is clear and moist.  Eyes: Conjunctivae and EOM are normal. Pupils are equal, round, and reactive to light.  Neck: Normal range of motion.  Cardiovascular: Normal rate, normal heart sounds and intact distal pulses.   Pulmonary/Chest: Effort normal. No respiratory distress. He has no wheezes. He has no rales.  Normal work of breathing. No respiratory distress noted.   Abdominal: Soft. He exhibits distension. There is tenderness (Lower abdomen). There is no rebound.  Musculoskeletal: Normal range of motion.  Neurological: He is alert and oriented to person, place, and time.  AxO x 3. Appears to not be oriented to situation.  Cranial Nerves:  III,IV, VI: ptosis not present, extra-ocular movements intact bilaterally, direct and consensual pupillary light reflexes intact bilaterally V: facial sensation, jaw opening, and bite strength equal bilaterally VII: eyebrow raise, eyelid close, smile, frown, pucker equal bilaterally VIII: hearing grossly normal bilaterally  IX,X: palate elevation and swallowing intact XI: bilateral shoulder shrug and lateral head rotation equal and strong XII: midline tongue extension  Negative pronator drift, negative Romberg, negative RAM's,  negative heel-to-shin, negative finger to nose.    Sensory intact.  Muscle strength 5/5 Patient able to ambulate without difficulty.   Skin: Skin is warm.  Psychiatric:  Very tangential when answering responses. Patient able to respond to direct questions and simple commands when pressed on it, however consistently goes off on tangents and difficult to redirect.   Nursing note and vitals reviewed.    ED Treatments / Results  Labs (all labs ordered are listed, but only abnormal results are displayed) Labs Reviewed  URINALYSIS, ROUTINE W REFLEX MICROSCOPIC - Abnormal; Notable for the following:       Result Value   Glucose, UA 150 (*)    Hgb urine dipstick SMALL (*)    Ketones, ur 5 (*)    Leukocytes, UA SMALL (*)    All other components within normal limits  COMPREHENSIVE METABOLIC PANEL - Abnormal; Notable for the following:    Sodium 124 (*)    Chloride 90 (*)    Glucose, Bld 169 (*)    BUN 5 (*)    Calcium 8.8 (*)    ALT 15 (*)    All other components within normal limits  CBC - Abnormal; Notable for the following:    RBC 4.17 (*)    HCT 38.5 (*)    All other components within normal limits  ACETAMINOPHEN LEVEL - Abnormal; Notable for the following:    Acetaminophen (Tylenol), Serum <10 (*)    All other components within normal limits  DIFFERENTIAL  SALICYLATE LEVEL  ETHANOL  CREATININE, URINE, RANDOM  SODIUM, URINE, RANDOM  OSMOLALITY, URINE  RAPID URINE DRUG SCREEN, HOSP PERFORMED  MAGNESIUM  PHOSPHORUS  VITAMIN B12  FOLATE  IRON AND TIBC  FERRITIN  RETICULOCYTES  PREALBUMIN  RPR  HIV ANTIBODY (ROUTINE TESTING)    EKG  EKG Interpretation None       Radiology Ct Head Wo Contrast  Result Date: 12/12/2016 CLINICAL DATA:  61 year old male with altered mental status and confusion. EXAM: CT HEAD WITHOUT CONTRAST TECHNIQUE: Contiguous axial images were obtained from the base of the skull through the vertex without intravenous contrast. COMPARISON:  Head  CT 01/15/2015. FINDINGS: Brain: Patchy areas of mild decreased attenuation are noted throughout the deep and periventricular white matter of the cerebral hemispheres bilaterally, compatible with mild chronic microvascular ischemic disease. No evidence of acute infarction, hemorrhage, hydrocephalus, extra-axial collection or mass lesion/mass effect. Vascular: No hyperdense vessel or unexpected calcification. Skull: Normal. Negative for fracture or focal lesion. Sinuses/Orbits: No acute finding. Other: None. IMPRESSION: 1. No acute intracranial abnormalities. 2. Mild chronic microvascular ischemic changes in the cerebral white matter, as above. Electronically Signed   By: Trudie Reed M.D.   On: 12/12/2016 22:28    Procedures Procedures (including critical care time)  Medications Ordered in ED Medications  0.9 %  sodium chloride infusion ( Intravenous New Bag/Given 12/12/16 2346)     Initial Impression / Assessment and Plan / ED Course  I have reviewed the triage  vital signs and the nursing notes.  Pertinent labs & imaging results that were available during my care of the patient were reviewed by me and considered in my medical decision making (see chart for details).    Patient appears to be hyponatremic. 61 year old male presenting with urinary retention. Patient is a poor historian and difficult to get history. He is very tangential. Very difficult to answer simple questions. He is oriented times person, time, place and does not appear to be oriented to situation. No prior psychiatric history. His neuro exam appears normal. Finger-nose negative, pronator drift negative. Negative RAMs. Patient able to stand and walk without difficulty. Foley catheter placed on patient and patient able to void 2 L of fluid. Patient started on AMS workup started due to previous workups not mentioning these findings.  Sodium shown to be 124. CT negative for any acute intracranial abnormalities.  This hyponatremia  may be related to patients mental status. UA does not shows sings of infection at this time.   Also seen and evaluated by Dr. Verdie Mosher.  Patient will be started on NS infusion.   I spoke with Dr. Adela Glimpse who agreed to have patient admitted to telemetry.   Final Clinical Impressions(s) / ED Diagnoses   Final diagnoses:  Hyponatremia    New Prescriptions New Prescriptions   No medications on file     [address] [place], [place] 12/13/16 6962    Lavera Guise, MD 12/13/16 1328

## 2016-12-13 ENCOUNTER — Encounter (HOSPITAL_COMMUNITY): Payer: Self-pay | Admitting: Internal Medicine

## 2016-12-13 ENCOUNTER — Inpatient Hospital Stay (HOSPITAL_COMMUNITY): Payer: Self-pay

## 2016-12-13 DIAGNOSIS — R17 Unspecified jaundice: Secondary | ICD-10-CM | POA: Diagnosis present

## 2016-12-13 DIAGNOSIS — F29 Unspecified psychosis not due to a substance or known physiological condition: Secondary | ICD-10-CM | POA: Diagnosis present

## 2016-12-13 DIAGNOSIS — R9431 Abnormal electrocardiogram [ECG] [EKG]: Secondary | ICD-10-CM | POA: Diagnosis present

## 2016-12-13 DIAGNOSIS — R634 Abnormal weight loss: Secondary | ICD-10-CM | POA: Diagnosis present

## 2016-12-13 DIAGNOSIS — F17210 Nicotine dependence, cigarettes, uncomplicated: Secondary | ICD-10-CM

## 2016-12-13 DIAGNOSIS — E119 Type 2 diabetes mellitus without complications: Secondary | ICD-10-CM

## 2016-12-13 DIAGNOSIS — F4325 Adjustment disorder with mixed disturbance of emotions and conduct: Secondary | ICD-10-CM

## 2016-12-13 DIAGNOSIS — R339 Retention of urine, unspecified: Secondary | ICD-10-CM

## 2016-12-13 DIAGNOSIS — G9340 Encephalopathy, unspecified: Secondary | ICD-10-CM

## 2016-12-13 DIAGNOSIS — R338 Other retention of urine: Secondary | ICD-10-CM

## 2016-12-13 DIAGNOSIS — R972 Elevated prostate specific antigen [PSA]: Secondary | ICD-10-CM | POA: Diagnosis present

## 2016-12-13 DIAGNOSIS — Z79899 Other long term (current) drug therapy: Secondary | ICD-10-CM

## 2016-12-13 LAB — RETICULOCYTES
RBC.: 4.33 MIL/uL (ref 4.22–5.81)
RETIC COUNT ABSOLUTE: 26 10*3/uL (ref 19.0–186.0)
Retic Ct Pct: 0.6 % (ref 0.4–3.1)

## 2016-12-13 LAB — COMPREHENSIVE METABOLIC PANEL
ALT: 14 U/L — AB (ref 17–63)
AST: 24 U/L (ref 15–41)
Albumin: 3.7 g/dL (ref 3.5–5.0)
Alkaline Phosphatase: 97 U/L (ref 38–126)
Anion gap: 8 (ref 5–15)
BUN: 5 mg/dL — ABNORMAL LOW (ref 6–20)
CO2: 27 mmol/L (ref 22–32)
CREATININE: 0.81 mg/dL (ref 0.61–1.24)
Calcium: 9 mg/dL (ref 8.9–10.3)
Chloride: 95 mmol/L — ABNORMAL LOW (ref 101–111)
GFR calc non Af Amer: >60 mL/min (ref >60)
Glucose, Bld: 118 mg/dL — ABNORMAL HIGH (ref 65–99)
POTASSIUM: 3.6 mmol/L (ref 3.5–5.1)
Sodium: 130 mmol/L — ABNORMAL LOW (ref 135–145)
Total Bilirubin: 1.3 mg/dL — ABNORMAL HIGH (ref 0.3–1.2)
Total Protein: 6.2 g/dL — ABNORMAL LOW (ref 6.5–8.1)

## 2016-12-13 LAB — CBC
HEMATOCRIT: 40.6 % (ref 39.0–52.0)
Hemoglobin: 14.4 g/dL (ref 13.0–17.0)
MCH: 33.1 pg (ref 26.0–34.0)
MCHC: 35.5 g/dL (ref 30.0–36.0)
MCV: 93.3 fL (ref 78.0–100.0)
PLATELETS: 259 10*3/uL (ref 150–400)
RBC: 4.35 MIL/uL (ref 4.22–5.81)
RDW: 12.5 % (ref 11.5–15.5)
WBC: 7.3 10*3/uL (ref 4.0–10.5)

## 2016-12-13 LAB — TROPONIN I: Troponin I: <0.03 ng/mL (ref <0.03)

## 2016-12-13 LAB — RAPID URINE DRUG SCREEN, HOSP PERFORMED
Amphetamines: NOT DETECTED
Barbiturates: NOT DETECTED
Benzodiazepines: NOT DETECTED
COCAINE: NOT DETECTED
Opiates: NOT DETECTED
Tetrahydrocannabinol: NOT DETECTED

## 2016-12-13 LAB — GLUCOSE, CAPILLARY
GLUCOSE-CAPILLARY: 174 mg/dL — AB (ref 65–99)
GLUCOSE-CAPILLARY: 156 mg/dL — AB (ref 65–99)
Glucose-Capillary: 115 mg/dL — ABNORMAL HIGH (ref 65–99)
Glucose-Capillary: 115 mg/dL — ABNORMAL HIGH (ref 65–99)
Glucose-Capillary: 191 mg/dL — ABNORMAL HIGH (ref 65–99)
Glucose-Capillary: 158 mg/dL — ABNORMAL HIGH (ref 65–99)

## 2016-12-13 LAB — FERRITIN: Ferritin: 111 ng/mL (ref 24–336)

## 2016-12-13 LAB — IRON AND TIBC
IRON: 198 ug/dL — AB (ref 45–182)
Saturation Ratios: 73 % — ABNORMAL HIGH (ref 17.9–39.5)
TIBC: 272 ug/dL (ref 250–450)
UIBC: 74 ug/dL

## 2016-12-13 LAB — MAGNESIUM
MAGNESIUM: 1.9 mg/dL (ref 1.7–2.4)
Magnesium: 2 mg/dL (ref 1.7–2.4)

## 2016-12-13 LAB — FOLATE: FOLATE: 17.2 ng/mL (ref >5.9)

## 2016-12-13 LAB — HIV ANTIBODY (ROUTINE TESTING W REFLEX): HIV SCREEN 4TH GENERATION: NONREACTIVE

## 2016-12-13 LAB — RPR: RPR: NONREACTIVE

## 2016-12-13 LAB — CREATININE, URINE, RANDOM: Creatinine, Urine: 15.49 mg/dL

## 2016-12-13 LAB — TSH: TSH: 3.268 u[IU]/mL (ref 0.350–4.500)

## 2016-12-13 LAB — PHOSPHORUS
PHOSPHORUS: 3.1 mg/dL (ref 2.5–4.6)
Phosphorus: 2.9 mg/dL (ref 2.5–4.6)

## 2016-12-13 LAB — PSA: PSA: 7.65 ng/mL — AB (ref 0.00–4.00)

## 2016-12-13 LAB — SODIUM, URINE, RANDOM: SODIUM UR: 19 mmol/L

## 2016-12-13 LAB — PREALBUMIN: Prealbumin: 24.8 mg/dL (ref 18–38)

## 2016-12-13 LAB — VITAMIN B12: Vitamin B-12: 251 pg/mL (ref 180–914)

## 2016-12-13 LAB — OSMOLALITY, URINE: OSMOLALITY UR: 87 mosm/kg — AB (ref 300–900)

## 2016-12-13 MED ORDER — INSULIN ASPART 100 UNIT/ML ~~LOC~~ SOLN: 0.0000 [IU] | Freq: Every day | SUBCUTANEOUS | Status: DC

## 2016-12-13 MED ORDER — THIAMINE HCL 100 MG/ML IJ SOLN
100.0000 mg | Freq: Once | INTRAMUSCULAR | Status: AC
  Administered 2016-12-13: 100 mg via INTRAVENOUS
  Filled 2016-12-13: qty 2

## 2016-12-13 MED ORDER — VITAMIN B-1 100 MG PO TABS
100.0000 mg | ORAL_TABLET | Freq: Every day | ORAL | Status: DC
  Administered 2016-12-13 – 2016-12-14 (×2): 100 mg via ORAL
  Filled 2016-12-13 (×2): qty 1

## 2016-12-13 MED ORDER — AMLODIPINE BESYLATE 5 MG PO TABS
5.0000 mg | ORAL_TABLET | Freq: Every day | ORAL | Status: DC
  Administered 2016-12-13 – 2016-12-14 (×2): 5 mg via ORAL
  Filled 2016-12-13 (×2): qty 1

## 2016-12-13 MED ORDER — SODIUM CHLORIDE 0.9 % IV SOLN
INTRAVENOUS | Status: DC
Start: 2016-12-13 — End: 2016-12-13
  Administered 2016-12-13: 03:00:00 via INTRAVENOUS

## 2016-12-13 MED ORDER — FOLIC ACID 1 MG PO TABS
1.0000 mg | ORAL_TABLET | Freq: Every day | ORAL | Status: DC
  Administered 2016-12-13 – 2016-12-14 (×2): 1 mg via ORAL
  Filled 2016-12-13 (×2): qty 1

## 2016-12-13 MED ORDER — TAMSULOSIN HCL 0.4 MG PO CAPS
0.4000 mg | ORAL_CAPSULE | Freq: Every day | ORAL | Status: DC
  Administered 2016-12-13 – 2016-12-14 (×2): 0.4 mg via ORAL
  Filled 2016-12-13 (×2): qty 1

## 2016-12-13 MED ORDER — ONDANSETRON HCL 4 MG/2ML IJ SOLN: 4.0000 mg | Freq: Four times a day (QID) | INTRAMUSCULAR | Status: DC | PRN

## 2016-12-13 MED ORDER — ADULT MULTIVITAMIN W/MINERALS CH
1.0000 | ORAL_TABLET | Freq: Every day | ORAL | Status: DC
  Administered 2016-12-13 – 2016-12-14 (×2): 1 via ORAL
  Filled 2016-12-13 (×2): qty 1

## 2016-12-13 MED ORDER — HYDROCODONE-ACETAMINOPHEN 5-325 MG PO TABS: 1.0000 | ORAL_TABLET | ORAL | Status: DC | PRN

## 2016-12-13 MED ORDER — INSULIN ASPART 100 UNIT/ML ~~LOC~~ SOLN
0.0000 [IU] | SUBCUTANEOUS | Status: DC
  Administered 2016-12-13: 2 [IU] via SUBCUTANEOUS

## 2016-12-13 MED ORDER — ACETAMINOPHEN 325 MG PO TABS: 650.0000 mg | ORAL_TABLET | Freq: Four times a day (QID) | ORAL | Status: DC | PRN

## 2016-12-13 MED ORDER — HYDROCORTISONE 2.5 % RE CREA
TOPICAL_CREAM | Freq: Two times a day (BID) | RECTAL | Status: DC | PRN
  Filled 2016-12-13: qty 28.35

## 2016-12-13 MED ORDER — VITAMIN B-12 1000 MCG PO TABS
1000.0000 ug | ORAL_TABLET | Freq: Every day | ORAL | Status: DC
  Administered 2016-12-13 – 2016-12-14 (×2): 1000 ug via ORAL
  Filled 2016-12-13 (×2): qty 1

## 2016-12-13 MED ORDER — THIAMINE HCL 100 MG/ML IJ SOLN: 100.0000 mg | Freq: Every day | INTRAMUSCULAR | Status: DC

## 2016-12-13 MED ORDER — FINASTERIDE 5 MG PO TABS
5.0000 mg | ORAL_TABLET | Freq: Every day | ORAL | Status: DC
  Administered 2016-12-13 – 2016-12-14 (×2): 5 mg via ORAL
  Filled 2016-12-13 (×2): qty 1

## 2016-12-13 MED ORDER — ONDANSETRON HCL 4 MG PO TABS: 4.0000 mg | ORAL_TABLET | Freq: Four times a day (QID) | ORAL | Status: DC | PRN

## 2016-12-13 MED ORDER — ACETAMINOPHEN 650 MG RE SUPP: 650.0000 mg | Freq: Four times a day (QID) | RECTAL | Status: DC | PRN

## 2016-12-13 MED ORDER — PNEUMOCOCCAL VAC POLYVALENT 25 MCG/0.5ML IJ INJ
0.5000 mL | INJECTION | INTRAMUSCULAR | Status: AC
  Administered 2016-12-14: 0.5 mL via INTRAMUSCULAR
  Filled 2016-12-13: qty 0.5

## 2016-12-13 MED ORDER — SODIUM CHLORIDE 0.9% FLUSH
3.0000 mL | Freq: Two times a day (BID) | INTRAVENOUS | Status: DC
  Administered 2016-12-13 – 2016-12-14 (×3): 3 mL via INTRAVENOUS

## 2016-12-13 MED ORDER — INFLUENZA VAC SPLIT QUAD 0.5 ML IM SUSY
0.5000 mL | PREFILLED_SYRINGE | INTRAMUSCULAR | Status: AC
  Administered 2016-12-14: 0.5 mL via INTRAMUSCULAR
  Filled 2016-12-13: qty 0.5

## 2016-12-13 MED ORDER — QUETIAPINE FUMARATE 25 MG PO TABS
25.0000 mg | ORAL_TABLET | Freq: Every day | ORAL | Status: DC
  Administered 2016-12-13: 25 mg via ORAL
  Filled 2016-12-13: qty 1

## 2016-12-13 MED ORDER — LORAZEPAM 1 MG PO TABS: 1.0000 mg | ORAL_TABLET | Freq: Four times a day (QID) | ORAL | Status: DC | PRN

## 2016-12-13 MED ORDER — LORAZEPAM 2 MG/ML IJ SOLN: 1.0000 mg | Freq: Four times a day (QID) | INTRAMUSCULAR | Status: DC | PRN

## 2016-12-13 MED ORDER — INSULIN ASPART 100 UNIT/ML ~~LOC~~ SOLN
0.0000 [IU] | Freq: Three times a day (TID) | SUBCUTANEOUS | Status: DC
  Administered 2016-12-13 – 2016-12-14 (×3): 2 [IU] via SUBCUTANEOUS

## 2016-12-13 NOTE — Evaluation (Signed)
Physical Therapy Evaluation Patient Details Name: Mark Graham MRN: [id number] DOB: [birth year] Today's Date: 12/13/2016   History of Present Illness  61 y.o. male with medical history significant of hypertension and tobacco abuse, DM 2 with peripheral neuropathy, and homelessness. Pt presented to the ED with c/o unable to urinate.  Clinical Impression  Pt admitted with above diagnosis. Pt currently with functional limitations due to the deficits listed below (see PT Problem List). On eval, Pt required min guard assist transfers and ambulation 150 feet without AD.  Pt will benefit from skilled PT to increase their independence and safety with mobility to allow discharge to the venue listed below.  PT to follow acutely. No f/u services indicated.     Follow Up Recommendations No PT follow up;Supervision - Intermittent    Equipment Recommendations  Cane    Recommendations for Other Services       Precautions / Restrictions Precautions Precautions: Fall      Mobility  Bed Mobility Overal bed mobility: Modified Independent                Transfers Overall transfer level: Needs assistance Equipment used: None Transfers: Sit to/from [employer] Transfers Sit to Stand: Min guard Stand pivot transfers: Min guard       General transfer comment: min guard for safety  Ambulation/Gait Ambulation/Gait assistance: Min guard Ambulation Distance (Feet): 150 Feet Assistive device: 1 person hand held assist Gait Pattern/deviations: Staggering right;Staggering left;Decreased stride length Gait velocity: decreased   General Gait Details: Occassional HHA. No acute LOB but pt staggered R/L. Min guard assist provided for safety.  Stairs            Wheelchair Mobility    Modified Rankin (Stroke Patients Only)       Balance Overall balance assessment: Needs assistance Sitting-balance support: Feet supported;No upper extremity supported Sitting balance-Leahy Scale:  Good     Standing balance support: No upper extremity supported;During functional activity Standing balance-Leahy Scale: Fair                               Pertinent Vitals/Pain Pain Assessment: No/denies pain    Home Living Family/patient expects to be discharged to:: Private residence Living Arrangements: Alone   Type of Home: Mobile home Home Access: Stairs to enter   [employer] of Steps: unsure how many Home Layout: One level Home Equipment: None      Prior Function Level of Independence: Independent         Comments: Pt is a poor historian. He reports currently residing in a dilapidated mobile home. He says he lost his cane a few months ago.     Hand Dominance        Extremity/Trunk Assessment   Upper Extremity Assessment Upper Extremity Assessment: Overall WFL for tasks assessed    Lower Extremity Assessment Lower Extremity Assessment: Overall WFL for tasks assessed    Cervical / Trunk Assessment Cervical / Trunk Assessment: Kyphotic  Communication   Communication: No difficulties  Cognition Arousal/Alertness: Awake/alert Behavior During Therapy: Impulsive Overall Cognitive Status: No family/caregiver present to determine baseline cognitive functioning                 General Comments: Pt with tangential speech. Very random with his topics of conversation and difficult to keep on task.    General Comments      Exercises     Assessment/Plan    PT Assessment  Patient needs continued PT services  PT Problem List Decreased activity tolerance;Decreased balance;Decreased cognition;Decreased mobility;Decreased safety awareness          PT Treatment Interventions DME instruction;Gait training;Stair training;Functional mobility training;Balance training;Therapeutic exercise;Therapeutic activities;Patient/family education    PT Goals (Current goals can be found in the Care Plan section)  Acute Rehab PT Goals Patient  Stated Goal: not stated PT Goal Formulation: With patient Time For Goal Achievement: 12/27/16 Potential to Achieve Goals: Good    Frequency Min 3X/week   Barriers to discharge        Co-evaluation               End of Session Equipment Utilized During Treatment: Gait belt Activity Tolerance: Patient tolerated treatment well Patient left: in bed;with bed alarm set;with call bell/phone within reach Nurse Communication: Mobility status         Time: [id number] PT Time Calculation (min) (ACUTE ONLY): 14 min   Charges:   PT Evaluation $PT Eval Moderate Complexity: 1 Procedure     PT G Codes:        Ilda FoilGarrow, Elke Holtry Rene 12/13/2016, 11:57 AM

## 2016-12-13 NOTE — Progress Notes (Signed)
Pt has what looks like prolapsed internal hemorrhoid. Pt denies rectal pain and perianal itching. Noted scant bleeding and soilage with feces. MD paged with this information. Order received for Anusol PRN.

## 2016-12-13 NOTE — Progress Notes (Signed)
[facility] Progress Note  Patient: Mark Graham:[id number]   PCP: No PCP Per Patient DOB: 05-20-1956   DOA: 12/12/2016   DOS: 12/13/2016   Date of Service: the patient was seen and examined on 12/13/2016   Subjective: Feeling better, no acute complaints.  Brief hospital course: Pt. with PMH of hypertension, tobacco abuse, type II DM; admitted on 12/12/2016, with complaint of suprapubic pain, was found to have acute urinary retention. Patient was recommended for admission due to hyponatremia and acute encephalopathy as well. Currently further plan is close monitoring.  Assessment and Plan: 1. Acute encephalopathy, questionable psychosis. Hyponatremia. Patient was admitted in the hospital with concern for possible psychosis. At present I do not suspect that he has any ongoing active psychosis he definitely has some encephalopathy from metabolic reasons. I will replace his B-12 which is borderline. No focal deficit on examination. Continue thiamin replacement as well. Monitor for response. Hyponatremia is thought to be secondary to poor oral intake, getting better with hydration. Monitor.  2. Acute urinary retention. Patient is -4 L in the hospital, post obstructive diuresis. At risk for developing acute kidney injury. Starting the patient on Flomax and finasteride. PSA level elevated, will need outpatient workup. Will check ultrasound renal to rule out hydronephrosis. We will attempt voiding trial in the hospital as with patient's encephalopathy and homelessness managing chronic Foley catheter will be difficult. No significant abnormality concerning spinal cord. Patient was able to ambulate 150 feet without any assisting device with physical therapy and no therapy has recommended on discharge.  3. Abnormal EKG. Patient's EKG initially read as stable elevation in the inferior leads, repeat EKG was unremarkable. Patient denies having any chest pain. Troponins are negative. I do  not suspect the patient has any ongoing ACS. Echocardiogram was ordered by my colleague overnight we will monitor the results.  4. Iron overload. With normal ferritin level no evidence of suspected hemachromatosis. But patient has elevated bilirubin level I would check ultrasound liver to identify further etiology.  5. Essential hypertension. Patient on Norvasc at home which is continued, was on hydrochlorothiazide which is currently stopping the setting of hyponatremia.  6. Concern for alcohol abuse. Patient was placed on CIWA protocol by admitting doctor. Currently appears to be stable. We'll monitor.  Bowel regimen: last BM prior to admission Diet: regular diet DVT Prophylaxis: subcutaneous Heparin  Advance goals of care discussion: full code  Family Communication: no family was present at bedside, at the time of interview.  Disposition:  Discharge to home. Expected discharge date: 12/14/2016, depending on above mentioned workup  Consultants: none Procedures: none  Antibiotics: Anti-infectives    None        Objective: Physical Exam: Vitals:   12/13/16 0131 12/13/16 0516 12/13/16 0811 12/13/16 1503  BP: (!) 165/100 133/73 138/76 97/71  Pulse: 75 88 76 86  Resp: 20 20 20 12   Temp: 98.3 F (36.8 C) 98.7 F (37.1 C) 98.6 F (37 C) 98.7 F (37.1 C)  TempSrc:  Oral  Oral  SpO2: 99% 97%  94%  Weight: 79.3 kg (174 lb 13.2 oz)     Height: 5\' 10"  (1.778 m)       Intake/Output Summary (Last 24 hours) at 12/13/16 1612 Last data filed at 12/13/16 1343  Gross per 24 hour  Intake            972.5 ml  Output             5700 ml  Net          -  4727.5 ml   Filed Weights   12/13/16 0131  Weight: 79.3 kg (174 lb 13.2 oz)    General: Alert, Awake and Oriented to Time, Place and Person. Appear in mild distress, affect appropriate Eyes: PERRL, Conjunctiva normal ENT: Oral Mucosa clear moist. Neck: no JVD, no Abnormal Mass Or lumps Cardiovascular: S1 and S2 Present,  no Murmur, Respiratory: Bilateral Air entry equal and Decreased, no use of accessory muscle, Clear to Auscultation, no Crackles, no wheezes Abdomen: Bowel Sound present, Soft and RUQ mild tenderness Skin: no redness, no Rash, no induration Extremities: no Pedal edema, no calf tenderness Neurologic: Grossly no focal neuro deficit. Bilaterally Equal motor strength  Data Reviewed: CBC:  Recent Labs Lab 12/12/16 2128 12/13/16 0221  WBC 9.7 7.3  NEUTROABS 7.7  --   HGB 13.7 14.4  HCT 38.5* 40.6  MCV 92.3 93.3  PLT 249 259   Basic Metabolic Panel:  Recent Labs Lab 12/12/16 2128 12/13/16 0220 12/13/16 0221  NA 124*  --  130*  K 3.7  --  3.6  CL 90*  --  95*  CO2 23  --  27  GLUCOSE 169*  --  118*  BUN 5*  --  5*  CREATININE 0.73  --  0.81  CALCIUM 8.8*  --  9.0  MG  --  1.9 2.0  PHOS  --  2.9 3.1    Liver Function Tests:  Recent Labs Lab 12/12/16 2128 12/13/16 0221  AST 24 24  ALT 15* 14*  ALKPHOS 102 97  BILITOT 1.2 1.3*  PROT 6.5 6.2*  ALBUMIN 3.8 3.7   No results for input(s): LIPASE, AMYLASE in the last 168 hours. No results for input(s): AMMONIA in the last 168 hours. Coagulation Profile: No results for input(s): INR, PROTIME in the last 168 hours. Cardiac Enzymes:  Recent Labs Lab 12/13/16 1008  TROPONINI <0.03   BNP (last 3 results) No results for input(s): PROBNP in the last 8760 hours.  CBG:  Recent Labs Lab 12/13/16 0226 12/13/16 0404 12/13/16 0801 12/13/16 1228  GLUCAP 115* 115* 174* 156*    Studies: Dg Chest 2 View  Result Date: 12/13/2016 CLINICAL DATA:  Hypertension EXAM: CHEST  2 VIEW COMPARISON:  December 22, 2015 FINDINGS: There is no edema or consolidation. The heart size and pulmonary vascularity are normal. No adenopathy. There is mild degenerative change in thoracic spine. There is aortic atherosclerosis. IMPRESSION: No edema or consolidation.  Aortic atherosclerosis. Electronically Signed   By: Bretta BangWilliam  Woodruff III M.D.    On: 12/13/2016 09:07   Dg Thoracic Spine 2 View  Result Date: 12/13/2016 CLINICAL DATA:  Soft tissue prominence subcutaneous tissue of back EXAM: THORACIC SPINE 3 VIEWS COMPARISON:  Chest radiograph December 22, 2015 FINDINGS: Frontal, lateral, and swimmer's views obtained. No fracture or spondylolisthesis. There is mild osteoarthritic change at several levels. No erosive change or paraspinous lesion. No soft tissue mass is evident by radiography. IMPRESSION: No soft tissue mass evident by radiography. MR would be the optimum imaging study of choice to assess for soft tissue lesion in the posterior thoracic region. Mild osteoarthritic change at several levels. No fracture or spondylolisthesis. Electronically Signed   By: Bretta BangWilliam  Woodruff III M.D.   On: 12/13/2016 09:21   Ct Head Wo Contrast  Result Date: 12/12/2016 CLINICAL DATA:  61 year old male with altered mental status and confusion. EXAM: CT HEAD WITHOUT CONTRAST TECHNIQUE: Contiguous axial images were obtained from the base of the skull through the vertex without intravenous contrast.  COMPARISON:  Head CT 01/15/2015. FINDINGS: Brain: Patchy areas of mild decreased attenuation are noted throughout the deep and periventricular white matter of the cerebral hemispheres bilaterally, compatible with mild chronic microvascular ischemic disease. No evidence of acute infarction, hemorrhage, hydrocephalus, extra-axial collection or mass lesion/mass effect. Vascular: No hyperdense vessel or unexpected calcification. Skull: Normal. Negative for fracture or focal lesion. Sinuses/Orbits: No acute finding. Other: None. IMPRESSION: 1. No acute intracranial abnormalities. 2. Mild chronic microvascular ischemic changes in the cerebral white matter, as above. Electronically Signed   By: Trudie Reed M.D.   On: 12/12/2016 22:28     Scheduled Meds: . amLODipine  5 mg Oral Daily  . finasteride  5 mg Oral Daily  . folic acid  1 mg Oral Daily  . [START ON  12/14/2016] Influenza vac split quadrivalent PF  0.5 mL Intramuscular Tomorrow-1000  . insulin aspart  0-5 Units Subcutaneous QHS  . insulin aspart  0-9 Units Subcutaneous TID WC  . multivitamin with minerals  1 tablet Oral Daily  . [START ON 12/14/2016] pneumococcal 23 valent vaccine  0.5 mL Intramuscular Tomorrow-1000  . sodium chloride flush  3 mL Intravenous Q12H  . tamsulosin  0.4 mg Oral Daily  . thiamine  100 mg Oral Daily  . vitamin B-12  1,000 mcg Oral Daily   Continuous Infusions: PRN Meds: acetaminophen **OR** acetaminophen, hydrocortisone, LORazepam **OR** LORazepam, ondansetron **OR** ondansetron (ZOFRAN) IV  Time spent: 30 minutes  Author: Lynden Oxford, MD [facility] Pager: [phone] 12/13/2016 4:12 PM  If 7PM-7AM, please contact night-coverage at [email or web address], password [facility]

## 2016-12-13 NOTE — Progress Notes (Signed)
Patient has been voiding frequently since foley catheter was removed at 1344.  On bladder scan at 2134, >44470mL of urine was measured by bladder scan and patient's abdomen was distended.  RN paged Kim,MD. Kim,MD returned page and placed one time order for in and out cath. 2 RN's completed in and out cath at 2229 and 700mL of urine was drained. Will continue to monitor and treat per MD orders.

## 2016-12-13 NOTE — Consult Note (Signed)
[place] Psychiatry Consult   Reason for Consult:  capacity Referring Physician:  Dr. Posey Pronto Patient Identification: Mark Graham MRN:  [id number] Principal Diagnosis: <principal problem not specified> Diagnosis:   Patient Active Problem List   Diagnosis Date Noted  . Psychosis [F29] 12/13/2016  . Weight loss [R63.4] 12/13/2016  . Abnormal ECG [R94.31] 12/13/2016  . Diabetes mellitus ([place]) [E11.9] 12/12/2016  . Tobacco abuse [Z72.0] 10/21/2015  . Hyperglycemia [R73.9] 10/21/2015  . Urinary frequency [R35.0] 10/21/2015  . RUQ abdominal pain [R10.11] 10/21/2015  . Hypertension [I10] 03/03/2014  . Hyponatremia [E87.1] 03/03/2014  . Chest pain [R07.9] 03/03/2014  . Leukocytosis [D72.829] 03/03/2014  . Acute urinary retention [R33.8] 03/03/2014    Total Time spent with patient: 1 hour  Subjective:   Mark Graham is a 61 y.o. male patient admitted with urinary retension.  HPI:  Mark Graham is a 61 y.o. male seen, chart reviewed for the face-to-face psychiatric consultation and evaluation of capacity to make his own medical decisions. Patient refused to participate in psychiatric evaluation and started thank you for coming in but I do not want to spend any time with psychiatrist.  Medical history: Patient  with medical history significant ofhypertension and tobacco abuse, DM 2 with peripheral neuropathy, homelessness,  Presented with with complaints of difficulty with urination. He have not had an appetite for few days. He has not had anything to eat for 48 h. He drank some coffee and some water before he had to turn off the tab since his house got no central heat. He have had some beers in AM. HE has long standing hx of Urinary retention, homelessness, being poor hystorian. HE presented to ER stating "I don't know how to put that thing in me to make me pee" He was noted to be soiled with urine and some blood. Patient may have some developmental dela at base line unable to provide  hx. Very tangential has flight of ideas refers to [employer] and [place] speaking to him describing in political situation. In the past he has admitted to binge drinking EtOH states he drinks water he can get his hands on that financially hasn't been able to buy much states she lives on $100 a month he has been losing weight. Continues to smoke. Reports favors to his right side when walks although I am unable to elucidate any weakness on the right.   Past Psychiatric History: Patient has a history of alcohol abuse but has no history of acute psychiatric hospitalization.  Risk to Self: Is patient at risk for suicide?: No Risk to Others:   Prior Inpatient Therapy:   Prior Outpatient Therapy:    Past Medical History:  Past Medical History:  Diagnosis Date  . Diabetes mellitus without complication ([place])   . Hypertension   . Tobacco abuse    History reviewed. No pertinent surgical history. Family History:  Family History  Problem Relation Age of Onset  . Cancer Father   . Cancer Sister   . Diabetes Mellitus II      States 6 out of 45 of his siblings    Family Psychiatric  History:Noncontributory Social History:  History  Alcohol Use  . Yes    Comment: drinks when he has any money     History  Drug Use No    Social History   Social History  . Marital status: Single    Spouse name: N/A  . Number of children: N/A  . Years of  education: N/A   Social History Main Topics  . Smoking status: Current Every Day Smoker    Packs/day: 1.00    Types: Cigarettes  . Smokeless tobacco: Current User  . Alcohol use Yes     Comment: drinks when he has any money  . Drug use: No  . Sexual activity: Not Asked   Other Topics Concern  . None   Social History Narrative  . None   Additional Social History:    Allergies:  No Known Allergies  Labs:  Results for orders placed or performed during the hospital encounter of 12/12/16 (from the past 48 hour(s))  Osmolality, urine      Status: Abnormal   Collection Time: 12/12/16 12:46 AM  Result Value Ref Range   Osmolality, Ur 87 (L) 300 - 900 mOsm/kg  Urinalysis, Routine w reflex microscopic     Status: Abnormal   Collection Time: 12/12/16  3:45 PM  Result Value Ref Range   Color, Urine YELLOW YELLOW   APPearance CLEAR CLEAR   Specific Gravity, Urine 1.010 1.005 - 1.030   pH 7.0 5.0 - 8.0   Glucose, UA 150 (A) NEGATIVE mg/dL   Hgb urine dipstick SMALL (A) NEGATIVE   Bilirubin Urine NEGATIVE NEGATIVE   Ketones, ur 5 (A) NEGATIVE mg/dL   Protein, ur NEGATIVE NEGATIVE mg/dL   Nitrite NEGATIVE NEGATIVE   Leukocytes, UA SMALL (A) NEGATIVE   RBC / HPF 0-5 0 - 5 RBC/hpf   WBC, UA 0-5 0 - 5 WBC/hpf   Bacteria, UA NONE SEEN NONE SEEN   Squamous Epithelial / LPF NONE SEEN NONE SEEN  Comprehensive metabolic panel     Status: Abnormal   Collection Time: 12/12/16  9:28 PM  Result Value Ref Range   Sodium 124 (L) 135 - 145 mmol/L   Potassium 3.7 3.5 - 5.1 mmol/L   Chloride 90 (L) 101 - 111 mmol/L   CO2 23 22 - 32 mmol/L   Glucose, Bld 169 (H) 65 - 99 mg/dL   BUN 5 (L) 6 - 20 mg/dL   Creatinine, Ser 0.73 0.61 - 1.24 mg/dL   Calcium 8.8 (L) 8.9 - 10.3 mg/dL   Total Protein 6.5 6.5 - 8.1 g/dL   Albumin 3.8 3.5 - 5.0 g/dL   AST 24 15 - 41 U/L   ALT 15 (L) 17 - 63 U/L   Alkaline Phosphatase 102 38 - 126 U/L   Total Bilirubin 1.2 0.3 - 1.2 mg/dL   GFR calc non Af Amer >60 >60 mL/min   GFR calc Af Amer >60 >60 mL/min    Comment: (NOTE) The eGFR has been calculated using the CKD EPI equation. This calculation has not been validated in all clinical situations. eGFR's persistently <60 mL/min signify possible Chronic Kidney Disease.    Anion gap 11 5 - 15  CBC     Status: Abnormal   Collection Time: 12/12/16  9:28 PM  Result Value Ref Range   WBC 9.7 4.0 - 10.5 K/uL   RBC 4.17 (L) 4.22 - 5.81 MIL/uL   Hemoglobin 13.7 13.0 - 17.0 g/dL   HCT 38.5 (L) 39.0 - 52.0 %   MCV 92.3 78.0 - 100.0 fL   MCH 32.9 26.0 - 34.0 pg    MCHC 35.6 30.0 - 36.0 g/dL   RDW 12.5 11.5 - 15.5 %   Platelets 249 150 - 400 K/uL  Differential     Status: None   Collection Time: 12/12/16  9:28 PM  Result  Value Ref Range   Neutrophils Relative % 82 %   Neutro Abs 7.7 1.7 - 7.7 K/uL   Lymphocytes Relative 11 %   Lymphs Abs 1.0 0.7 - 4.0 K/uL   Monocytes Relative 7 %   Monocytes Absolute 0.6 0.1 - 1.0 K/uL   Eosinophils Relative 0 %   Eosinophils Absolute 0.0 0.0 - 0.7 K/uL   Basophils Relative 0 %   Basophils Absolute 0.0 0.0 - 0.1 K/uL  Acetaminophen level     Status: Abnormal   Collection Time: 12/12/16  9:28 PM  Result Value Ref Range   Acetaminophen (Tylenol), Serum <10 (L) 10 - 30 ug/mL    Comment:        THERAPEUTIC CONCENTRATIONS VARY SIGNIFICANTLY. A RANGE OF 10-30 ug/mL MAY BE AN EFFECTIVE CONCENTRATION FOR MANY PATIENTS. HOWEVER, SOME ARE BEST TREATED AT CONCENTRATIONS OUTSIDE THIS RANGE. ACETAMINOPHEN CONCENTRATIONS >150 ug/mL AT 4 HOURS AFTER INGESTION AND >50 ug/mL AT 12 HOURS AFTER INGESTION ARE OFTEN ASSOCIATED WITH TOXIC REACTIONS.   Salicylate level     Status: None   Collection Time: 12/12/16  9:28 PM  Result Value Ref Range   Salicylate Lvl <9.2 2.8 - 30.0 mg/dL  Ethanol     Status: None   Collection Time: 12/12/16  9:28 PM  Result Value Ref Range   Alcohol, Ethyl (B) <5 <5 mg/dL    Comment:        LOWEST DETECTABLE LIMIT FOR SERUM ALCOHOL IS 5 mg/dL FOR MEDICAL PURPOSES ONLY   Creatinine, urine, random     Status: None   Collection Time: 12/13/16 12:40 AM  Result Value Ref Range   Creatinine, Urine 15.49 mg/dL  Sodium, urine, random     Status: None   Collection Time: 12/13/16 12:40 AM  Result Value Ref Range   Sodium, Ur 19 mmol/L  Rapid urine drug screen (hospital performed)     Status: None   Collection Time: 12/13/16 12:40 AM  Result Value Ref Range   Opiates NONE DETECTED NONE DETECTED   Cocaine NONE DETECTED NONE DETECTED   Benzodiazepines NONE DETECTED NONE DETECTED    Amphetamines NONE DETECTED NONE DETECTED   Tetrahydrocannabinol NONE DETECTED NONE DETECTED   Barbiturates NONE DETECTED NONE DETECTED    Comment:        DRUG SCREEN FOR MEDICAL PURPOSES ONLY.  IF CONFIRMATION IS NEEDED FOR ANY PURPOSE, NOTIFY LAB WITHIN 5 DAYS.        LOWEST DETECTABLE LIMITS FOR URINE DRUG SCREEN Drug Class       Cutoff (ng/mL) Amphetamine      1000 Barbiturate      200 Benzodiazepine   119 Tricyclics       417 Opiates          300 Cocaine          300 THC              50   Magnesium     Status: None   Collection Time: 12/13/16  2:20 AM  Result Value Ref Range   Magnesium 1.9 1.7 - 2.4 mg/dL  Phosphorus     Status: None   Collection Time: 12/13/16  2:20 AM  Result Value Ref Range   Phosphorus 2.9 2.5 - 4.6 mg/dL  Vitamin B12     Status: None   Collection Time: 12/13/16  2:20 AM  Result Value Ref Range   Vitamin B-12 251 180 - 914 pg/mL    Comment: (NOTE) This assay is not validated  for testing neonatal or myeloproliferative syndrome specimens for Vitamin B12 levels.   Iron and TIBC     Status: Abnormal   Collection Time: 12/13/16  2:20 AM  Result Value Ref Range   Iron 198 (H) 45 - 182 ug/dL   TIBC 272 250 - 450 ug/dL   Saturation Ratios 73 (H) 17.9 - 39.5 %   UIBC 74 ug/dL  Ferritin     Status: None   Collection Time: 12/13/16  2:20 AM  Result Value Ref Range   Ferritin 111 24 - 336 ng/mL  Reticulocytes     Status: None   Collection Time: 12/13/16  2:20 AM  Result Value Ref Range   Retic Ct Pct 0.6 0.4 - 3.1 %   RBC. 4.33 4.22 - 5.81 MIL/uL   Retic Count, Manual 26.0 19.0 - 186.0 K/uL  Folate     Status: None   Collection Time: 12/13/16  2:21 AM  Result Value Ref Range   Folate 17.2 >5.9 ng/mL  Prealbumin     Status: None   Collection Time: 12/13/16  2:21 AM  Result Value Ref Range   Prealbumin 24.8 18 - 38 mg/dL  PSA     Status: Abnormal   Collection Time: 12/13/16  2:21 AM  Result Value Ref Range   PSA 7.65 (H) 0.00 - 4.00 ng/mL     Comment: (NOTE) While PSA levels of <=4.0 ng/ml are reported as reference range, some men with levels below 4.0 ng/ml can have prostate cancer and many men with PSA above 4.0 ng/ml do not have prostate cancer.  Other tests such as free PSA, age specific reference ranges, PSA velocity and PSA doubling time may be helpful especially in men less than 30 years old.   Magnesium     Status: None   Collection Time: 12/13/16  2:21 AM  Result Value Ref Range   Magnesium 2.0 1.7 - 2.4 mg/dL  Phosphorus     Status: None   Collection Time: 12/13/16  2:21 AM  Result Value Ref Range   Phosphorus 3.1 2.5 - 4.6 mg/dL  Comprehensive metabolic panel     Status: Abnormal   Collection Time: 12/13/16  2:21 AM  Result Value Ref Range   Sodium 130 (L) 135 - 145 mmol/L   Potassium 3.6 3.5 - 5.1 mmol/L   Chloride 95 (L) 101 - 111 mmol/L   CO2 27 22 - 32 mmol/L   Glucose, Bld 118 (H) 65 - 99 mg/dL   BUN 5 (L) 6 - 20 mg/dL   Creatinine, Ser 0.81 0.61 - 1.24 mg/dL   Calcium 9.0 8.9 - 10.3 mg/dL   Total Protein 6.2 (L) 6.5 - 8.1 g/dL   Albumin 3.7 3.5 - 5.0 g/dL   AST 24 15 - 41 U/L   ALT 14 (L) 17 - 63 U/L   Alkaline Phosphatase 97 38 - 126 U/L   Total Bilirubin 1.3 (H) 0.3 - 1.2 mg/dL   GFR calc non Af Amer >60 >60 mL/min   GFR calc Af Amer >60 >60 mL/min    Comment: (NOTE) The eGFR has been calculated using the CKD EPI equation. This calculation has not been validated in all clinical situations. eGFR's persistently <60 mL/min signify possible Chronic Kidney Disease.    Anion gap 8 5 - 15  CBC     Status: None   Collection Time: 12/13/16  2:21 AM  Result Value Ref Range   WBC 7.3 4.0 - 10.5 K/uL   RBC  4.35 4.22 - 5.81 MIL/uL   Hemoglobin 14.4 13.0 - 17.0 g/dL   HCT 40.6 39.0 - 52.0 %   MCV 93.3 78.0 - 100.0 fL   MCH 33.1 26.0 - 34.0 pg   MCHC 35.5 30.0 - 36.0 g/dL   RDW 12.5 11.5 - 15.5 %   Platelets 259 150 - 400 K/uL  TSH     Status: None   Collection Time: 12/13/16  2:22 AM  Result  Value Ref Range   TSH 3.268 0.350 - 4.500 uIU/mL    Comment: Performed by a 3rd Generation assay with a functional sensitivity of <=0.01 uIU/mL.  Glucose, capillary     Status: Abnormal   Collection Time: 12/13/16  2:26 AM  Result Value Ref Range   Glucose-Capillary 115 (H) 65 - 99 mg/dL   Comment 1 Notify RN    Comment 2 Document in Chart   Glucose, capillary     Status: Abnormal   Collection Time: 12/13/16  4:04 AM  Result Value Ref Range   Glucose-Capillary 115 (H) 65 - 99 mg/dL   Comment 1 Notify RN    Comment 2 Document in Chart   Glucose, capillary     Status: Abnormal   Collection Time: 12/13/16  8:01 AM  Result Value Ref Range   Glucose-Capillary 174 (H) 65 - 99 mg/dL  Troponin I     Status: None   Collection Time: 12/13/16 10:08 AM  Result Value Ref Range   Troponin I <0.03 <0.03 ng/mL    Current Facility-Administered Medications  Medication Dose Route Frequency Provider Last Rate Last Dose  . acetaminophen (TYLENOL) tablet 650 mg  650 mg Oral Q6H PRN Toy Baker, MD       Or  . acetaminophen (TYLENOL) suppository 650 mg  650 mg Rectal Q6H PRN Toy Baker, MD      . amLODipine (NORVASC) tablet 5 mg  5 mg Oral Daily Toy Baker, MD   5 mg at 12/13/16 1009  . finasteride (PROSCAR) tablet 5 mg  5 mg Oral Daily Lavina Hamman, MD   5 mg at 12/13/16 1009  . folic acid (FOLVITE) tablet 1 mg  1 mg Oral Daily Toy Baker, MD   1 mg at 12/13/16 1009  . hydrocortisone (ANUSOL-HC) 2.5 % rectal cream   Rectal BID PRN Toy Baker, MD      . Derrill Memo ON 12/14/2016] Influenza vac split quadrivalent PF (FLUARIX) injection 0.5 mL  0.5 mL Intramuscular Tomorrow-1000 Lavina Hamman, MD      . insulin aspart (novoLOG) injection 0-5 Units  0-5 Units Subcutaneous QHS Lavina Hamman, MD      . insulin aspart (novoLOG) injection 0-9 Units  0-9 Units Subcutaneous TID WC Lavina Hamman, MD      . LORazepam (ATIVAN) tablet 1 mg  1 mg Oral Q6H PRN Toy Baker,  MD       Or  . LORazepam (ATIVAN) injection 1 mg  1 mg Intravenous Q6H PRN Toy Baker, MD      . multivitamin with minerals tablet 1 tablet  1 tablet Oral Daily Toy Baker, MD   1 tablet at 12/13/16 1009  . ondansetron (ZOFRAN) tablet 4 mg  4 mg Oral Q6H PRN Toy Baker, MD       Or  . ondansetron (ZOFRAN) injection 4 mg  4 mg Intravenous Q6H PRN Toy Baker, MD      . Derrill Memo ON 12/14/2016] pneumococcal 23 valent vaccine (PNU-IMMUNE) injection 0.5 mL  0.5 mL Intramuscular Tomorrow-1000 Lavina Hamman, MD      . sodium chloride flush (NS) 0.9 % injection 3 mL  3 mL Intravenous Q12H Toy Baker, MD   3 mL at 12/13/16 1013  . tamsulosin (FLOMAX) capsule 0.4 mg  0.4 mg Oral Daily Toy Baker, MD   0.4 mg at 12/13/16 1009  . thiamine (VITAMIN B-1) tablet 100 mg  100 mg Oral Daily Toy Baker, MD   100 mg at 12/13/16 1009  . vitamin B-12 (CYANOCOBALAMIN) tablet 1,000 mcg  1,000 mcg Oral Daily Lavina Hamman, MD   1,000 mcg at 12/13/16 1009    Musculoskeletal: Strength & Muscle Tone: within normal limits Gait & Station: normal Patient leans: N/A  Psychiatric Specialty Exam: Physical Exam  ROS  Blood pressure 138/76, pulse 76, temperature 98.6 F (37 C), resp. rate 20, height _0  (1.778 m), weight 79.3 kg (174 lb 13.2 oz), SpO2 97 %.Body mass index is 25.08 kg/m.  General Appearance: Guarded  Eye Contact:  Good  Speech:  Clear and Coherent  Volume:  Increased  Mood:  Irritable  Affect:  Appropriate and Congruent  Thought Process:  Coherent and Goal Directed  Orientation:  Full (Time, Place, and Person)  Thought Content:  WDL  Suicidal Thoughts:  No  Homicidal Thoughts:  No  Memory:  Immediate;   Fair Recent;   Fair Remote;   Fair  Judgement:  Intact  Insight:  Fair  Psychomotor Activity:  Normal  Concentration:  Concentration: Fair and Attention Span: Fair  Recall:  Good  Fund of Knowledge:  Good  Language:  Good  Akathisia:   Negative  Handed:  Right  AIMS (if indicated):     Assets:  Communication Skills Desire for Improvement Leisure Time Resilience  ADL's:  Intact  Cognition:  WNL  Sleep:        Treatment Plan Summary:  Daily contact with patient to assess and evaluate symptoms and progress in treatment and Medication management   Based on my limited evaluation today secondary to poorly cooperative behaviors, patient has capacity to make his own medical decisions and living arrangements. If clinical situation changes or patient is willing to be more cooperative, may reconsult for psychiatric evaluation.   Disposition: Supportive therapy provided about ongoing stressors.  Ambrose Finland, MD 12/13/2016 12:41 PM

## 2016-12-13 NOTE — Progress Notes (Signed)
Foley removed at 13:44  BLADDER SCAN 17:45  AMOUNT: 200 cc  Urinated: 50cc

## 2016-12-14 ENCOUNTER — Telehealth: Payer: Self-pay

## 2016-12-14 ENCOUNTER — Inpatient Hospital Stay (HOSPITAL_COMMUNITY): Payer: Self-pay

## 2016-12-14 DIAGNOSIS — R9431 Abnormal electrocardiogram [ECG] [EKG]: Secondary | ICD-10-CM

## 2016-12-14 LAB — GLUCOSE, CAPILLARY
GLUCOSE-CAPILLARY: 118 mg/dL — AB (ref 65–99)
Glucose-Capillary: 168 mg/dL — ABNORMAL HIGH (ref 65–99)
Glucose-Capillary: 198 mg/dL — ABNORMAL HIGH (ref 65–99)

## 2016-12-14 LAB — ECHOCARDIOGRAM COMPLETE
Height: 70 in
Weight: 2797.2 oz

## 2016-12-14 LAB — HEMOGLOBIN A1C
HEMOGLOBIN A1C: 7.2 % — AB (ref 4.8–5.6)
Mean Plasma Glucose: 160 mg/dL

## 2016-12-14 MED ORDER — CYANOCOBALAMIN 1000 MCG PO TABS: 1000.0000 ug | ORAL_TABLET | Freq: Every day | ORAL | 0 refills | Status: DC

## 2016-12-14 MED ORDER — THIAMINE HCL 100 MG PO TABS
100.0000 mg | ORAL_TABLET | Freq: Every day | ORAL | 0 refills | Status: DC
Start: 2016-12-14 — End: 2017-01-22

## 2016-12-14 MED ORDER — TAMSULOSIN HCL 0.4 MG PO CAPS: 0.4000 mg | ORAL_CAPSULE | Freq: Every day | ORAL | 0 refills | Status: DC

## 2016-12-14 MED ORDER — QUETIAPINE FUMARATE 25 MG PO TABS: 25.0000 mg | ORAL_TABLET | Freq: Every day | ORAL | 0 refills | Status: DC

## 2016-12-14 MED ORDER — AMLODIPINE BESYLATE 5 MG PO TABS: 5.0000 mg | ORAL_TABLET | Freq: Every day | ORAL | 0 refills | Status: DC

## 2016-12-14 MED ORDER — GLIPIZIDE 5 MG PO TABS: 2.5000 mg | ORAL_TABLET | Freq: Every day | ORAL | 0 refills | Status: DC

## 2016-12-14 MED ORDER — HYDROCORTISONE 2.5 % RE CREA: TOPICAL_CREAM | Freq: Two times a day (BID) | RECTAL | 0 refills | Status: DC | PRN

## 2016-12-14 MED ORDER — FINASTERIDE 5 MG PO TABS: 5.0000 mg | ORAL_TABLET | Freq: Every day | ORAL | 0 refills | Status: DC

## 2016-12-14 MED ORDER — FOLIC ACID 1 MG PO TABS: 1.0000 mg | ORAL_TABLET | Freq: Every day | ORAL | 0 refills | Status: DC

## 2016-12-14 MED ORDER — GLUCERNA SHAKE PO LIQD: 237.0000 mL | Freq: Two times a day (BID) | ORAL | Status: DC

## 2016-12-14 MED FILL — ?QUETIAPINE FUMARATE 25 MG: 25 MG | 30 days supply | Qty: 30 | Fill #0

## 2016-12-14 MED FILL — FINASTERIDE 5 MG TABLET: 5 | 30 days supply | Qty: 30 | Fill #0

## 2016-12-14 MED FILL — HYDROCORTISONE 2.5% CREAM: 2.5 | 30 days supply | Qty: 30 | Fill #0

## 2016-12-14 MED FILL — AMLODIPINE BESYLATE 5 MG TA: 5 | 30 days supply | Qty: 30 | Fill #0

## 2016-12-14 MED FILL — FOLIC ACID 1 MG TABLET: 1 | 30 days supply | Qty: 30 | Fill #0

## 2016-12-14 MED FILL — TAMSULOSIN HCL 0.4 MG CAP: 0.4 | 30 days supply | Qty: 30 | Fill #0

## 2016-12-14 NOTE — Progress Notes (Signed)
Deberah CastleJerry D Fason to be D/C'd Home per MD order.  Discussed with the patient and all questions fully answered.  Allergies as of 12/14/2016   No Known Allergies     Medication List    STOP taking these medications   gabapentin 400 MG capsule Commonly known as:  NEURONTIN     TAKE these medications   amLODipine 5 MG tablet Commonly known as:  NORVASC Take 1 tablet (5 mg total) by mouth daily.   cyanocobalamin 1000 MCG tablet Take 1 tablet (1,000 mcg total) by mouth daily.   finasteride 5 MG tablet Commonly known as:  PROSCAR Take 1 tablet (5 mg total) by mouth daily.   folic acid 1 MG tablet Commonly known as:  FOLVITE Take 1 tablet (1 mg total) by mouth daily.   glipiZIDE 5 MG tablet Commonly known as:  GLUCOTROL Take 0.5 tablets (2.5 mg total) by mouth daily before breakfast. What changed:  how much to take   hydrocortisone 2.5 % rectal cream Commonly known as:  ANUSOL-HC Place rectally 2 (two) times daily as needed for hemorrhoids or itching.   nicotine 21 mg/24hr patch Commonly known as:  NICODERM CQ Place 1 patch (21 mg total) onto the skin daily.   QUEtiapine 25 MG tablet Commonly known as:  SEROQUEL Take 1 tablet (25 mg total) by mouth at bedtime.   tamsulosin 0.4 MG Caps capsule Commonly known as:  FLOMAX Take 1 capsule (0.4 mg total) by mouth daily.   thiamine 100 MG tablet Take 1 tablet (100 mg total) by mouth daily.       VVS, Skin clean, dry and intact without evidence of skin break down, no evidence of skin tears noted. IV catheter discontinued intact. Site without signs and symptoms of complications. Dressing and pressure applied.  An After Visit Summary was printed and given to the patient.  Patient given medications and instructed to pick up glipizide from [facility] at 1400.   D/c education completed with patient/family including follow up instructions, medication list, d/c activities limitations if  indicated, with other d/c instructions as indicated by MD - patient able to verbalize understanding, all questions fully answered.   Patient instructed to return to ED, call 911, or call MD for any changes in condition.   Patient escorted via WC, and D/C home via private auto.  Sandrea HughsJeter, [place] M 12/14/2016 7:28 PM

## 2016-12-14 NOTE — Care Management Note (Signed)
Case Management Note  Patient Details  Name: Mark CastleJerry D Lanagan MRN: [id number] Date of Birth: 08/18/1956  Subjective/Objective:         Admitted with acute urinary retention.           Action/Plan: Pt states he is homeless. However, @ d/c pt states he will reside @ [address] Pt will d/c with foley ( urinary retention).  Expected Discharge Date:  12/14/16               Expected Discharge Plan:  Home/Self Care  In-House Referral:     Discharge planning Services  Follow-up appt scheduled, [facility], CM [facility], 12/21/2016 at 2:45 pm with Dr. Venetia NightAmao)    Post hospital follow [facility] Juniffany Noel [facility].   Post Acute Care Choice:    Choice offered to:   patient  DME Arranged:  Cane (charity case) DME Agency:  [facility]   HH Arranged:    HH Agency:     Status of Service:  Completed, signed off  If discussed at Long Length of Stay Meetings, dates discussed:    Additional Comments:  Epifanio LeschesCole, Shantae Vantol Hudson, RN 12/14/2016, 2:46 PM

## 2016-12-14 NOTE — Progress Notes (Signed)
  Echocardiogram 2D Echocardiogram has been performed.  Mark Graham 12/14/2016, 10:07 AM

## 2016-12-14 NOTE — Evaluation (Signed)
Occupational Therapy Evaluation and Discharge Patient Details Name: Mark Graham MRN: [id number] DOB: 05/18/1956 Today's Date: 12/14/2016    History of Present Illness 61 y.o. male with medical history significant of hypertension and tobacco abuse, DM 2 with peripheral neuropathy. Pt presented to the ED with c/o unable to urinate.   Clinical Impression   This 61 yo male admitted with above presents to acute OT at a Mod I level, we will sign off.     Follow Up Recommendations  No OT follow up    Equipment Recommendations  None recommended by OT       Precautions / Restrictions Precautions Precautions: Fall Restrictions Weight Bearing Restrictions: No      Mobility Bed Mobility               General bed mobility comments: Pt up at sink upon my arrival  Transfers Overall transfer level: Modified independent                    Balance Overall balance assessment: Needs assistance Sitting-balance support: No upper extremity supported;Feet supported Sitting balance-Leahy Scale: Good     Standing balance support: No upper extremity supported;During functional activity Standing balance-Leahy Scale: Good Standing balance comment: standing at sink to shave and wash face---he was able to lean out to the right out of his base of support to reach something on his bedside table as he was standing at the sink                            ADL Overall ADL's : Modified independent                                             Vision Patient Visual Report: No change from baseline              Pertinent Vitals/Pain Pain Assessment: No/denies pain     Hand Dominance Right   Extremity/Trunk Assessment Upper Extremity Assessment Upper Extremity Assessment: Overall WFL for tasks assessed           Communication Communication Communication: No difficulties   Cognition Arousal/Alertness: Awake/alert Behavior During Therapy: WFL for  tasks assessed/performed Overall Cognitive Status: No family/caregiver present to determine baseline cognitive functioning                 General Comments: Pt with tangential speech. Very random with his topics of conversation and difficult to keep on task.              Home Living Family/patient expects to be discharged to:: Private residence Living Arrangements: Alone   Type of Home: Mobile home Home Access: Stairs to enter [employer] Corporation[employer]trance Stairs-Number of Steps: unsure how many   Home Layout: One level     Bathroom Shower/Tub: Tub/shower unit;Curtain Shower/tub characteristics: Engineer, building servicesCurtain Bathroom Toilet: Standard     Home Equipment: None          Prior Functioning/Environment Level of Independence: Independent        Comments: He reports currently residing in a dilapidated mobile home and walks where he needs to go.  He says he lost his cane a few months ago.                 OT Goals(Current goals can be found in the care plan section) Acute Rehab  OT Goals Patient Stated Goal: to go home today  OT Frequency:                End of Session Equipment Utilized During Treatment:  (none) Nurse Communication: Other (comment) (pt ready to go from OT standpoint)  Activity Tolerance: Patient tolerated treatment well Patient left:  (sitting EOB eating lunch)  OT Visit Diagnosis: Other abnormalities of gait and mobility (R26.89)                ADL either performed or assessed with clinical judgement  Time: 1226-1251 OT Time Calculation (min): 25 min Charges:  OT General Charges $OT Visit: 1 Procedure OT Evaluation $OT Eval Moderate Complexity: 1 Procedure OT Treatments $Self Care/Home Management : 8-22 mins Evette Georges [phone] 12/14/2016, 12:59 PM

## 2016-12-14 NOTE — Progress Notes (Signed)
CSW met with pt to discuss housing concerns.  Pt states he has been living at [facility] for 17.5 years in a condemned house.  States his mom died in Jan 01, 1999 and he has been homeless ever since.  CSW attempted to due VI-SPAT survey with patient but patient refused to complete survey at this time and feels as if he can find housing on his own.  Pt was agreeable to being given shelter resources but states that they are unsafe and that he plans on returning to his previous living situation.   Pt answered all questions appropriately but was tangential throughout the interview and often brought up bible verses- pt was easily redirected however.  Pt does not report other needs at this time.  Jorge Ny, LCSW Clinical Social Worker [phone]

## 2016-12-14 NOTE — Telephone Encounter (Signed)
Message received from [employer] Cole. RN CM [facility]. An appointment was scheduled for 12/21/16 @ 1445 and the information was placed on the AVS.  Update provided to A. Richardson Doppole, RN CM

## 2016-12-14 NOTE — Progress Notes (Signed)
Initial Nutrition Assessment   INTERVENTION:  Provide Glucerna Shake po BID, each supplement provides 220 kcal and 10 grams of protein  Provide Multivitamin with minerals daily  NUTRITION DIAGNOSIS:   Inadequate oral intake related to poor appetite as evidenced by per patient/family report.   GOAL:   Patient will meet greater than or equal to 90% of their needs   MONITOR:   PO intake, Supplement acceptance, Skin, Labs, Weight trends  REASON FOR ASSESSMENT:   Consult Assessment of nutrition requirement/status (Malnutrition)  ASSESSMENT:   PMH of hypertension, tobacco abuse, type II DM; admitted on 12/12/2016, with complaint of suprapubic pain, was found to have acute urinary retention. Patient was recommended for admission due to hyponatremia and acute encephalopathy   Pt states that he eats poorly. He states that he does not eat enough variety of foods and sometimes he sits on the couch drinking so much coffee that he doesn't eat breakfast. Pt very tangential and walking around room, making nutrition assessment difficult. RD discussed the importance of nutrition. Recommended 3 consistent meals daily. Recommended using Glucerna Shakes to supplement skipped meals or poor intake at meal.  There is no evidence of weight loss per weight history. Unable to perform nutrition-focused physical exam due to patient pacing room.   Labs: low sodium, low chloride  Diet Order:  Diet Carb Modified Fluid consistency: Thin; Room service appropriate? Yes Diet Carb Modified  Skin:  Reviewed, no issues  Last BM:  2/17  Height:   Ht Readings from Last 1 Encounters:  12/13/16 5\' 10"  (1.778 m)    Weight:   Wt Readings from Last 1 Encounters:  12/13/16 174 lb 13.2 oz (79.3 kg)    Ideal Body Weight:  75.45 kg  BMI:  Body mass index is 25.08 kg/m.  Estimated Nutritional Needs:   Kcal:  2000-2200  Protein:  80-95 grams  Fluid:  2-2.2 L/day  EDUCATION NEEDS:   No education  needs identified at this time  Dorothea Ogleeanne Kadie Balestrieri RD, LDN, CSP Inpatient Clinical Dietitian Pager: [phone] After Hours Pager: [phone]

## 2016-12-15 NOTE — Discharge Summary (Signed)
[facility] Discharge Summary   Patient: Mark Graham ZOX:[id number]   PCP: No PCP Per Patient DOB: [birth year]   Date of admission: 12/12/2016   Date of discharge: 12/14/2016     Discharge Diagnoses:  Principal Problem:   Acute urinary retention Active Problems:   Hypertension   Hyponatremia   Tobacco abuse   Diabetes mellitus (HCC)   Psychosis   Weight loss   Abnormal ECG   Iron overload   Elevated bilirubin   PSA elevation   Admitted From: home Disposition:  home  Recommendations for Outpatient Follow-up:  1. Follow-up with PCP in one week 2. Follow-up with urology in one week for voiding trials   Follow-up Information    PCP. Schedule an appointment as soon as possible for a visit in 1 week(s).        [facility] Schedule an appointment as soon as possible for a visit in 2 week(s).   Why:  for voiding trial, removal of cathter and work for kidney stone. discussed with Dr Berneice Heinrich.  Contact information: [address]  [place] [address] [place] [address] [phone]        [facility] Follow up on 12/16/2016.   Why:  at 9am for a hospital follow up appointment with Scot Jun NP.  Please bring all of your medications with you.  Contact information: [address] [place] [address] [phone]         Diet recommendation: Cardiac diet  Activity: The patient is advised to gradually reintroduce usual activities.  Discharge Condition: good  Code Status: full code  History of present illness: As per the H and P dictated on admission, "Mark Graham is a 61 y.o. male with medical history significant ofhypertension and tobacco abuse, DM 2 with peripheral neuropathy, homelessness,     Presented with with complaints of difficulty with urination. He have not had an appetite for few days. He has not had anything to eat for 48 h. He drank some coffee and some water before he had to turn off the  tab since his house got no central heat. He have had some beers in AM. HE has long standing hx of Urinary retention, homelessness, being poor hystorian. HE presented to ER stating "I don't know how to put that thing in me to make me pee" He was noted to be soiled with urine and some blood. Patient may have some developmental dela at base line unable to provide hx. Very tangential has flight of ideas refers to [employer] and [address] speaking to him describing in political situation. In the past he has admitted to binge drinking EtOH states he drinks water he can get his hands on that financially hasn't been able to buy much states she lives on $100 a month he has been losing weight. Continues to smoke. Reports favors to his right side when walks although I am unable to elucidate any weakness on the right. "  Hospital Course:   Summary of his active problems in the hospital is as following. 1. Acute encephalopathy, questionable psychosis. Hyponatremia. Relative B-12 deficiency Patient was admitted in the hospital with concern for acute urinary retention and possible psychosis. At present I do not suspect that he has any ongoing active psychosis, Ecotrin was consulted who felt that the patient has capacity to make medical decisions. definitely has some encephalopathy from metabolic reasons. I will replace his B-12 which is borderline. No focal  deficit on examination. Continue thiamin replacement as well. Hyponatremia is thought to be secondary to poor oral intake, getting better with hydration  2. Acute urinary retention. Patient is -4 L in the hospital, post obstructive diuresis. Starting the patient on Flomax and finasteride. PSA level elevated, will need outpatient workup. ultrasound renal shows 2 cm renal stone without any obstruction but has mild hydronephrosis. failed voiding trial in the hospital In this patient managing chronic Foley catheter will be difficult. No significant  abnormality concerning spinal cord. Patient was able to ambulate 150 feet without any assisting device with physical therapy and no therapy has recommended on discharge. Discussed with urology, recommend outpatient follow-up in week.  3. Abnormal EKG. Patient's EKG initially read as ST elevation in the inferior leads, repeat EKG was unremarkable. Patient denies having any chest pain. Troponins are negative. I do not suspect the patient has any ongoing ACS. Echocardiogram is not showing any wall motion abnormalities, EF normal.  4. Iron overload. With normal ferritin level no evidence of suspected hemachromatosis. But patient has elevated bilirubin level ultrasound liver unremarkable.  5. Essential hypertension. Patient on Norvasc at home which is continued, was on hydrochlorothiazide which is currently stopping the setting of hyponatremia.  6. Concern for alcohol abuse. Patient was placed on CIWA protocol by admitting doctor. Currently appears to be stable.  All other chronic medical condition were stable during the hospitalization.  Patient was seen by physical therapy, who recommended no further indication for home therapy On the day of the discharge the patient's vitals were stable, and no other acute medical condition were reported by patient. the patient was felt safe to be discharge at home with family.  Procedures and Results:  Foley catheter insertion, acute urinary retention failed voiding trial   Ultrasound abdomen.  Echocardiogram Study Conclusions  - Left ventricle: The cavity size was normal. Systolic function was   normal. The estimated ejection fraction was in the range of 60%   to 65%. Wall motion was normal; there were no regional wall   motion abnormalities. Left ventricular diastolic function   parameters were normal. - Atrial septum: No defect or patent foramen ovale was identified.  Consultations:  Psychiatry  DISCHARGE MEDICATION: Discharge  Medication List as of 12/14/2016  6:33 PM    START taking these medications   Details  amLODipine (NORVASC) 5 MG tablet Take 1 tablet (5 mg total) by mouth daily., Starting Mon 12/14/2016, Print    finasteride (PROSCAR) 5 MG tablet Take 1 tablet (5 mg total) by mouth daily., Starting Mon 12/14/2016, Print    folic acid (FOLVITE) 1 MG tablet Take 1 tablet (1 mg total) by mouth daily., Starting Mon 12/14/2016, Print    hydrocortisone (ANUSOL-HC) 2.5 % rectal cream Place rectally 2 (two) times daily as needed for hemorrhoids or itching., Starting Mon 12/14/2016, Print    QUEtiapine (SEROQUEL) 25 MG tablet Take 1 tablet (25 mg total) by mouth at bedtime., Starting Mon 12/14/2016, Print    tamsulosin (FLOMAX) 0.4 MG CAPS capsule Take 1 capsule (0.4 mg total) by mouth daily., Starting Mon 12/14/2016, Print    thiamine 100 MG tablet Take 1 tablet (100 mg total) by mouth daily., Starting Mon 12/14/2016, Print    vitamin B-12 1000 MCG tablet Take 1 tablet (1,000 mcg total) by mouth daily., Starting Mon 12/14/2016, Print      CONTINUE these medications which have CHANGED   Details  glipiZIDE (GLUCOTROL) 5 MG tablet Take 0.5 tablets (2.5 mg total) by mouth  daily before breakfast., Starting Mon 12/14/2016, Print      CONTINUE these medications which have NOT CHANGED   Details  nicotine (NICODERM CQ) 21 mg/24hr patch Place 1 patch (21 mg total) onto the skin daily., Starting Mon 10/21/2015, Print      STOP taking these medications     gabapentin (NEURONTIN) 400 MG capsule        No Known Allergies Discharge Instructions    Diet Carb Modified    Complete by:  As directed    Discharge instructions    Complete by:  As directed    It is important that you read following instructions as well as go over your medication list with RN to help you understand your care after this hospitalization.  Discharge Instructions: Please follow-up with PCP in one week  Please request your primary care physician  to go over all Hospital Tests and Procedure/Radiological results at the follow up,  Please get all Hospital records sent to your PCP by signing hospital release before you go home.   Do not drive, operating heavy machinery, perform activities at heights, swimming or participation in water activities or provide baby sitting services; until you have been seen by Primary Care Physician or a Neurologist and advised to do so again. Do not take more than prescribed Pain, Sleep and Anxiety Medications. You were cared for by a hospitalist during your hospital stay. If you have any questions about your discharge medications or the care you received while you were in the hospital after you are discharged, you can call the unit and ask to speak with the hospitalist on call if the hospitalist that took care of you is not available.  Once you are discharged, your primary care physician will handle any further medical issues. Please note that NO REFILLS for any discharge medications will be authorized once you are discharged, as it is imperative that you return to your primary care physician (or establish a relationship with a primary care physician if you do not have one) for your aftercare needs so that they can reassess your need for medications and monitor your lab values. You Must read complete instructions/literature along with all the possible adverse reactions/side effects for all the Medicines you take and that have been prescribed to you. Take any new Medicines after you have completely understood and accept all the possible adverse reactions/side effects. Wear Seat belts while driving. If you have smoked or chewed Tobacco in the last 2 yrs please stop smoking and/or stop any Recreational drug use.   Driving Restrictions    Complete by:  As directed    Do not drive until cleared by PCP.   Increase activity slowly    Complete by:  As directed      Discharge Exam: Filed Weights   12/13/16 0131    Weight: 79.3 kg (174 lb 13.2 oz)   Vitals:   12/14/16 0826 12/14/16 1330  BP: (!) 161/89 128/83  Pulse: 70 (!) 109  Resp:    Temp:  98.3 F (36.8 C)   General: Appear in no distress, no Rash; Oral Mucosa moist. Cardiovascular: S1 and S2 Present, no Murmur, no JVD Respiratory: Bilateral Air entry present and Clear to Auscultation, no Crackles, no wheezes Abdomen: Bowel Sound present, Soft and no tenderness Extremities: no Pedal edema, no calf tenderness Neurology: Grossly no focal neuro deficit.  The results of significant diagnostics from this hospitalization (including imaging, microbiology, ancillary and laboratory) are listed below for reference.  Significant Diagnostic Studies: Dg Chest 2 View  Result Date: 12/13/2016 CLINICAL DATA:  Hypertension EXAM: CHEST  2 VIEW COMPARISON:  December 22, 2015 FINDINGS: There is no edema or consolidation. The heart size and pulmonary vascularity are normal. No adenopathy. There is mild degenerative change in thoracic spine. There is aortic atherosclerosis. IMPRESSION: No edema or consolidation.  Aortic atherosclerosis. Electronically Signed   By: Bretta BangWilliam  Woodruff III M.D.   On: 12/13/2016 09:07   Dg Thoracic Spine 2 View  Result Date: 12/13/2016 CLINICAL DATA:  Soft tissue prominence subcutaneous tissue of back EXAM: THORACIC SPINE 3 VIEWS COMPARISON:  Chest radiograph December 22, 2015 FINDINGS: Frontal, lateral, and swimmer's views obtained. No fracture or spondylolisthesis. There is mild osteoarthritic change at several levels. No erosive change or paraspinous lesion. No soft tissue mass is evident by radiography. IMPRESSION: No soft tissue mass evident by radiography. MR would be the optimum imaging study of choice to assess for soft tissue lesion in the posterior thoracic region. Mild osteoarthritic change at several levels. No fracture or spondylolisthesis. Electronically Signed   By: Bretta BangWilliam  Woodruff III M.D.   On: 12/13/2016 09:21    Ct Head Wo Contrast  Result Date: 12/12/2016 CLINICAL DATA:  61 year old male with altered mental status and confusion. EXAM: CT HEAD WITHOUT CONTRAST TECHNIQUE: Contiguous axial images were obtained from the base of the skull through the vertex without intravenous contrast. COMPARISON:  Head CT 01/15/2015. FINDINGS: Brain: Patchy areas of mild decreased attenuation are noted throughout the deep and periventricular white matter of the cerebral hemispheres bilaterally, compatible with mild chronic microvascular ischemic disease. No evidence of acute infarction, hemorrhage, hydrocephalus, extra-axial collection or mass lesion/mass effect. Vascular: No hyperdense vessel or unexpected calcification. Skull: Normal. Negative for fracture or focal lesion. Sinuses/Orbits: No acute finding. Other: None. IMPRESSION: 1. No acute intracranial abnormalities. 2. Mild chronic microvascular ischemic changes in the cerebral white matter, as above. Electronically Signed   By: Trudie Reedaniel  Entrikin M.D.   On: 12/12/2016 22:28   [place] Abdomen Complete  Result Date: 12/14/2016 CLINICAL DATA:  61 year old male with hyperbilirubinemia, and urinary retention. EXAM: ABDOMEN ULTRASOUND COMPLETE COMPARISON:  Abdominal ultrasound dated 10/21/2015 FINDINGS: Gallbladder: No gallstones or wall thickening visualized. No sonographic Murphy sign noted by sonographer. Common bile duct: Diameter: 3 mm Liver: No focal lesion identified. Within normal limits in parenchymal echogenicity. IVC: No abnormality visualized. Pancreas: Visualized portion unremarkable. Spleen: Size and appearance within normal limits. Right Kidney: Length: 11.5 cm. Echogenicity within normal limits. No mass or hydronephrosis visualized. Left Kidney: Length: 12.3 cm. There is a 2.2 cm stone in the left renal pelvis. There is mild hydronephrosis of the upper pole collecting system. Abdominal aorta: No aneurysm visualized. Other findings: None. IMPRESSION: A 2.2 cm stone in the  left renal pelvis with mild hydronephrosis of the upper pole collecting system. The remainder of the visualized structures appear unremarkable. Electronically Signed   By: Elgie CollardArash  Radparvar M.D.   On: 12/14/2016 06:57    Microbiology: No results found for this or any previous visit (from the past 240 hour(s)).   Labs: CBC:  Recent Labs Lab 12/12/16 2128 12/13/16 0221  WBC 9.7 7.3  NEUTROABS 7.7  --   HGB 13.7 14.4  HCT 38.5* 40.6  MCV 92.3 93.3  PLT 249 259   Basic Metabolic Panel:  Recent Labs Lab 12/12/16 2128 12/13/16 0220 12/13/16 0221  NA 124*  --  130*  K 3.7  --  3.6  CL 90*  --  95*  CO2 23  --  27  GLUCOSE 169*  --  118*  BUN 5*  --  5*  CREATININE 0.73  --  0.81  CALCIUM 8.8*  --  9.0  MG  --  1.9 2.0  PHOS  --  2.9 3.1   Liver Function Tests:  Recent Labs Lab 12/12/16 2128 12/13/16 0221  AST 24 24  ALT 15* 14*  ALKPHOS 102 97  BILITOT 1.2 1.3*  PROT 6.5 6.2*  ALBUMIN 3.8 3.7   No results for input(s): LIPASE, AMYLASE in the last 168 hours. No results for input(s): AMMONIA in the last 168 hours. Cardiac Enzymes:  Recent Labs Lab 12/13/16 1008  TROPONINI <0.03   BNP (last 3 results) No results for input(s): BNP in the last 8760 hours. CBG:  Recent Labs Lab 12/13/16 1642 12/13/16 2130 12/14/16 0751 12/14/16 1217 12/14/16 1842  GLUCAP 191* 158* 118* 168* 198*   Time spent: 30 minutes  Signed:  PATEL, PRANAV  [facility] 12/14/2016 , 5:43 PM

## 2016-12-16 ENCOUNTER — Ambulatory Visit: Payer: Self-pay | Attending: Family Medicine | Admitting: Physician Assistant

## 2016-12-16 VITALS — BP 159/100 | HR 85 | Temp 97.6°F | Resp 16 | Wt 172.6 lb

## 2016-12-16 DIAGNOSIS — I10 Essential (primary) hypertension: Secondary | ICD-10-CM

## 2016-12-16 DIAGNOSIS — N1330 Unspecified hydronephrosis: Secondary | ICD-10-CM | POA: Insufficient documentation

## 2016-12-16 DIAGNOSIS — E871 Hypo-osmolality and hyponatremia: Secondary | ICD-10-CM

## 2016-12-16 DIAGNOSIS — Z590 Homelessness: Secondary | ICD-10-CM | POA: Insufficient documentation

## 2016-12-16 DIAGNOSIS — E1140 Type 2 diabetes mellitus with diabetic neuropathy, unspecified: Secondary | ICD-10-CM

## 2016-12-16 DIAGNOSIS — E1142 Type 2 diabetes mellitus with diabetic polyneuropathy: Secondary | ICD-10-CM | POA: Insufficient documentation

## 2016-12-16 DIAGNOSIS — Z79899 Other long term (current) drug therapy: Secondary | ICD-10-CM | POA: Insufficient documentation

## 2016-12-16 DIAGNOSIS — Z7984 Long term (current) use of oral hypoglycemic drugs: Secondary | ICD-10-CM | POA: Insufficient documentation

## 2016-12-16 DIAGNOSIS — G9340 Encephalopathy, unspecified: Secondary | ICD-10-CM | POA: Insufficient documentation

## 2016-12-16 DIAGNOSIS — R339 Retention of urine, unspecified: Secondary | ICD-10-CM

## 2016-12-16 LAB — BASIC METABOLIC PANEL
BUN: 11 mg/dL (ref 7–25)
CALCIUM: 9.4 mg/dL (ref 8.6–10.3)
CHLORIDE: 98 mmol/L (ref 98–110)
CO2: 26 mmol/L (ref 20–31)
CREATININE: 0.83 mg/dL (ref 0.70–1.25)
GLUCOSE: 139 mg/dL — AB (ref 65–99)
Potassium: 4.2 mmol/L (ref 3.5–5.3)
Sodium: 135 mmol/L (ref 135–146)

## 2016-12-16 LAB — GLUCOSE, POCT (MANUAL RESULT ENTRY): POC GLUCOSE: 164 mg/dL — AB (ref 70–99)

## 2016-12-16 NOTE — Patient Instructions (Signed)
You have an appointment at Dr. Broadus JohnManning's office on 12/24/16 @1 :30 pm. They will remove the catheter from your bladder.  Please start taking all medications as prescribed today.  Please stop smoking!  Return here in 2 weeks for follow up. We will call you.

## 2016-12-16 NOTE — Progress Notes (Signed)
Mark Graham  ZOX:[id number]  [id number]:[id number]  DOB - 19-Oct-1956  Chief Complaint  Patient presents with  . Hospitalization Follow-up       Subjective:   Mark Graham is a 61 y.o. male here today for establishment of care. He has a history of diabetes mellitus type 2, hypertension, peripheral neuropathy, alcohol abuse and he smokes. He also has some history of homelessness. He presented to the emergency department on February 17 complaints of difficulty urinating, decreased appetite and mental status changes. A bladder scan revealed 1 L of urine and a Foley catheter was placed. His labs were concerning for sodium level of 124. His urinalysis was negative. A CT scan of his head was negative. His PSA was elevated. His TSH was within normal limits. He was given a normal saline infusion and the internal medicine team admit him. A renal ultrasound showed a 2 cm renal stone on the left with mild hydronephrosis. He was started on Flomax and finasteride. Given his mental status changes a psychiatry consultation was obtained with concern for psychosis. Psychiatry felt that he was okay to make his own decisions. An encephalopathy workup took place and B12 has been replaced.  He still is very tangential with my interview. I can't get much out of him. I know the Foley is still in place. He has not been taking his medications. He is living with a brother. He continues to smoke. No new complaints. He is wondering when the Foley can come out.   ROS: GEN: denies fever or chills, denies change in weight Skin: denies lesions or rashes HEENT: denies headache, earache, epistaxis, sore throat, or neck pain LUNGS: denies SHOB, dyspnea, PND, orthopnea CV: denies CP or palpitations ABD: denies abd pain, N or V EXT: denies muscle spasms or swelling; no pain in lower ext, no weakness NEURO: denies numbness or tingling, denies sz, stroke or TIA  ALLERGIES: No Known Allergies  PAST MEDICAL HISTORY: Past Medical  History:  Diagnosis Date  . Diabetes mellitus without complication (HCC)   . Hypertension   . Tobacco abuse     PAST SURGICAL HISTORY: No past surgical history on file.  MEDICATIONS AT HOME: Prior to Admission medications   Medication Sig Start Date End Date Taking? Authorizing Provider  amLODipine (NORVASC) 5 MG tablet Take 1 tablet (5 mg total) by mouth daily. 12/14/16   Rolly Salter, MD  finasteride (PROSCAR) 5 MG tablet Take 1 tablet (5 mg total) by mouth daily. 12/14/16   Rolly Salter, MD  folic acid (FOLVITE) 1 MG tablet Take 1 tablet (1 mg total) by mouth daily. 12/14/16   Rolly Salter, MD  glipiZIDE (GLUCOTROL) 5 MG tablet Take 0.5 tablets (2.5 mg total) by mouth daily before breakfast. 12/14/16   Rolly Salter, MD  hydrocortisone (ANUSOL-HC) 2.5 % rectal cream Place rectally 2 (two) times daily as needed for hemorrhoids or itching. 12/14/16   Rolly Salter, MD  nicotine (NICODERM CQ) 21 mg/24hr patch Place 1 patch (21 mg total) onto the skin daily. Patient not taking: Reported on 12/22/2015 10/21/15   Richarda Overlie, MD  QUEtiapine (SEROQUEL) 25 MG tablet Take 1 tablet (25 mg total) by mouth at bedtime. 12/14/16   Rolly Salter, MD  tamsulosin (FLOMAX) 0.4 MG CAPS capsule Take 1 capsule (0.4 mg total) by mouth daily. 12/14/16   Rolly Salter, MD  thiamine 100 MG tablet Take 1 tablet (100 mg total) by mouth daily. 12/14/16   Rolly Salter,  MD  vitamin B-12 1000 MCG tablet Take 1 tablet (1,000 mcg total) by mouth daily. 12/14/16   Rolly SalterPranav M Patel, MD     Objective:   Vitals:   12/16/16 0927  BP: (!) 159/100  Pulse: 85  Resp: 16  Temp: 97.6 F (36.4 C)  TempSrc: Oral  SpO2: 95%  Weight: 172 lb 9.6 oz (78.3 kg)  No meds taken today or yesterday!  Exam General appearance : Dirty, slightly disheveled HEENT: Atraumatic and Normocephalic, pupils equally reactive to light and accomodation Neck: supple, no JVD. No cervical lymphadenopathy.  Chest:Good air entry bilaterally,  no added sounds  CVS: S1 S2 regular, no murmurs.  Abdomen: Bowel sounds present, Non tender and not distended with no gaurding, rigidity or rebound. Extremities: B/L Lower Ext shows no edema, both legs are warm to touch Neurology: Awake alert, and oriented X 3, CN II-XII intact, Non focal Skin:No Rash Wounds:N/A  Data Review Lab Results  Component Value Date   HGBA1C 7.2 (H) 12/13/2016   HGBA1C 7.1 (H) 10/21/2015   HGBA1C 7.1 (H) 10/21/2015     Assessment & Plan  1. Urinary retention  -encouraged to take meds (proscar and flomax as prescribed)  -Keep foley in place through this week as instructed  -I made him an appt with Dr. Emmaline LifeManny's office for 12/24/16 @ 1:30 pm 2. Hyponatremia  -Check BMP today 3. Encephalopathy  -check BMP today  -Encouraged to comply with meds 4. HTN  -DASH diet  -take meds as prescribed 5. DM2  -take meds as prescribed 6. Smoker  -cessation discussed, he is NOT interested in quitting   Return in about 2 weeks (around 12/30/2016).  The patient was given clear instructions to go to ER or return to medical center if symptoms don't improve, worsen or new problems develop. The patient verbalized understanding. The patient was told to call to get lab results if they haven't heard anything in the next week.   This note has been created with Education officer, environmentalDragon speech recognition software and smart phrase technology. Any transcriptional errors are unintentional.    Scot Juniffany Hershey Knauer, PA-C [facility] [place], [place] [phone]   12/16/2016, 9:41 AM

## 2016-12-21 ENCOUNTER — Inpatient Hospital Stay: Payer: Self-pay | Admitting: Family Medicine

## 2016-12-30 ENCOUNTER — Ambulatory Visit: Payer: Self-pay | Admitting: Family Medicine

## 2017-01-05 ENCOUNTER — Emergency Department (HOSPITAL_COMMUNITY)
Admission: EM | Admit: 2017-01-05 | Discharge: 2017-01-05 | Disposition: A | Discharge status: Home / Self Care | Payer: Self-pay | Attending: Emergency Medicine | Admitting: Emergency Medicine

## 2017-01-05 ENCOUNTER — Encounter (HOSPITAL_COMMUNITY): Payer: Self-pay

## 2017-01-05 DIAGNOSIS — I10 Essential (primary) hypertension: Secondary | ICD-10-CM | POA: Insufficient documentation

## 2017-01-05 DIAGNOSIS — Z466 Encounter for fitting and adjustment of urinary device: Secondary | ICD-10-CM | POA: Insufficient documentation

## 2017-01-05 DIAGNOSIS — Z79899 Other long term (current) drug therapy: Secondary | ICD-10-CM | POA: Insufficient documentation

## 2017-01-05 DIAGNOSIS — E1140 Type 2 diabetes mellitus with diabetic neuropathy, unspecified: Secondary | ICD-10-CM | POA: Insufficient documentation

## 2017-01-05 DIAGNOSIS — N3000 Acute cystitis without hematuria: Secondary | ICD-10-CM | POA: Insufficient documentation

## 2017-01-05 DIAGNOSIS — F17210 Nicotine dependence, cigarettes, uncomplicated: Secondary | ICD-10-CM | POA: Insufficient documentation

## 2017-01-05 DIAGNOSIS — Z7984 Long term (current) use of oral hypoglycemic drugs: Secondary | ICD-10-CM | POA: Insufficient documentation

## 2017-01-05 LAB — URINALYSIS, ROUTINE W REFLEX MICROSCOPIC
BILIRUBIN URINE: NEGATIVE
BILIRUBIN URINE: NEGATIVE
GLUCOSE, UA: NEGATIVE mg/dL
Glucose, UA: NEGATIVE mg/dL
KETONES UR: NEGATIVE mg/dL
Ketones, ur: NEGATIVE mg/dL
NITRITE: NEGATIVE
Nitrite: NEGATIVE
PH: 8 (ref 5.0–8.0)
PROTEIN: 30 mg/dL — AB
Protein, ur: NEGATIVE mg/dL
SPECIFIC GRAVITY, URINE: 1.004 — AB (ref 1.005–1.030)
Specific Gravity, Urine: 1.006 (ref 1.005–1.030)
Squamous Epithelial / LPF: NONE SEEN
pH: 8 (ref 5.0–8.0)

## 2017-01-05 LAB — I-STAT CHEM 8, ED
BUN: 8 mg/dL (ref 6–20)
CALCIUM ION: 1.11 mmol/L — AB (ref 1.15–1.40)
CHLORIDE: 101 mmol/L (ref 101–111)
Creatinine, Ser: 0.8 mg/dL (ref 0.61–1.24)
GLUCOSE: 226 mg/dL — AB (ref 65–99)
HCT: 34 % — ABNORMAL LOW (ref 39.0–52.0)
Hemoglobin: 11.6 g/dL — ABNORMAL LOW (ref 13.0–17.0)
POTASSIUM: 4 mmol/L (ref 3.5–5.1)
Sodium: 137 mmol/L (ref 135–145)
TCO2: 28 mmol/L (ref 0–100)

## 2017-01-05 MED ORDER — IBUPROFEN 400 MG PO TABS
ORAL_TABLET | ORAL | Status: AC
  Filled 2017-01-05: qty 1

## 2017-01-05 MED ORDER — CEPHALEXIN 500 MG PO CAPS: 500.0000 mg | ORAL_CAPSULE | Freq: Four times a day (QID) | ORAL | 0 refills | Status: DC

## 2017-01-05 MED ORDER — IBUPROFEN 400 MG PO TABS
400.0000 mg | ORAL_TABLET | Freq: Once | ORAL | Status: AC | PRN
  Administered 2017-01-05: 400 mg via ORAL

## 2017-01-05 NOTE — Discharge Instructions (Signed)
Please see the Urologist as requested. You have a kidney stone.  Also there is a urinary tract infection - so we will start you on antibiotics for that.  Finally, you need to see Urology also for the urinary retention. If you cannot urinate, return to the ER.

## 2017-01-05 NOTE — ED Notes (Addendum)
Patient verbalized understanding of discharge instructions and denies any further needs or questions at this time. VS stable. Patient ambulatory with steady gait. Provided with bus pass.

## 2017-01-05 NOTE — ED Provider Notes (Signed)
[facility]-EMERGENCY DEPT Provider Note   CSN: [id number] Arrival date & time: 01/05/17  1313     History   Chief Complaint Chief Complaint  Patient presents with  . Catheter Removal    HPI Mark Graham is a 61 y.o. male.  HPI  61 y.o.malewith medical history significant ofhypertension and tobacco abuse, DM 2 with peripheral neuropathy who was admitted to the hospital on 2/18 and discharged with a foley catheter comes in with cc of foley removal. Pt's foley was placed 3 weeks ago. He reports that he never followed up with the appointment, and now when he calls, he gets no response from Urology clinic. Pt wants the foley  Catheter removed, as he is having discomfort at the insertion site. Pt has no back pain, fevers, chills, nausea.  Past Medical History:  Diagnosis Date  . Diabetes mellitus without complication (HCC)   . Hypertension   . Tobacco abuse     Patient Active Problem List   Diagnosis Date Noted  . Psychosis 12/13/2016  . Weight loss 12/13/2016  . Abnormal ECG 12/13/2016  . Iron overload 12/13/2016  . Elevated bilirubin 12/13/2016  . PSA elevation 12/13/2016  . Diabetes mellitus (HCC) 12/12/2016  . Tobacco abuse 10/21/2015  . Hyperglycemia 10/21/2015  . Urinary frequency 10/21/2015  . RUQ abdominal pain 10/21/2015  . Hypertension 03/03/2014  . Hyponatremia 03/03/2014  . Chest pain 03/03/2014  . Leukocytosis 03/03/2014  . Acute urinary retention 03/03/2014    History reviewed. No pertinent surgical history.     Home Medications    Prior to Admission medications   Medication Sig Start Date End Date Taking? Authorizing Provider  amLODipine (NORVASC) 5 MG tablet Take 1 tablet (5 mg total) by mouth daily. 12/14/16  Yes Rolly Salter, MD  finasteride (PROSCAR) 5 MG tablet Take 1 tablet (5 mg total) by mouth daily. 12/14/16  Yes Rolly Salter, MD  folic acid (FOLVITE) 1 MG tablet Take 1 tablet (1 mg total) by mouth daily. 12/14/16  Yes Rolly Salter, MD    glipiZIDE (GLUCOTROL) 5 MG tablet Take 0.5 tablets (2.5 mg total) by mouth daily before breakfast. 12/14/16  Yes Rolly Salter, MD  hydrocortisone (ANUSOL-HC) 2.5 % rectal cream Place rectally 2 (two) times daily as needed for hemorrhoids or itching. 12/14/16  Yes Rolly Salter, MD  ibuprofen (ADVIL,MOTRIN) 200 MG tablet Take 200 mg by mouth every 6 (six) hours as needed for moderate pain.   Yes Historical Provider, MD  QUEtiapine (SEROQUEL) 25 MG tablet Take 1 tablet (25 mg total) by mouth at bedtime. 12/14/16  Yes Rolly Salter, MD  tamsulosin (FLOMAX) 0.4 MG CAPS capsule Take 1 capsule (0.4 mg total) by mouth daily. 12/14/16  Yes Rolly Salter, MD  nicotine (NICODERM CQ) 21 mg/24hr patch Place 1 patch (21 mg total) onto the skin daily. Patient not taking: Reported on 12/22/2015 10/21/15   Richarda Overlie, MD  thiamine 100 MG tablet Take 1 tablet (100 mg total) by mouth daily. Patient not taking: Reported on 01/05/2017 12/14/16   Rolly Salter, MD  vitamin B-12 1000 MCG tablet Take 1 tablet (1,000 mcg total) by mouth daily. Patient not taking: Reported on 01/05/2017 12/14/16   Rolly Salter, MD    Family History Family History  Problem Relation Age of Onset  . Cancer Father   . Cancer Sister   . Diabetes Mellitus II      States 6 out of 13 of his siblings  Social History Social History  Substance Use Topics  . Smoking status: Current Every Day Smoker    Packs/day: 1.00    Types: Cigarettes  . Smokeless tobacco: Current User  . Alcohol use Yes     Comment: drinks when he has any money     Allergies   Patient has no known allergies.   Review of Systems Review of Systems  ROS 10 Systems reviewed and are negative for acute change except as noted in the HPI.     Physical Exam Updated Vital Signs BP 139/89   Pulse 80   Temp 98 F (36.7 C) (Oral)   Resp 18   Ht 5\' 11"  (1.803 m)   Wt 172 lb (78 kg)   SpO2 96%   BMI 23.99 kg/m   Physical Exam  Constitutional: He  is oriented to person, place, and time. He appears well-developed.  HENT:  Head: Normocephalic and atraumatic.  Eyes: Conjunctivae and EOM are normal. Pupils are equal, round, and reactive to light.  Neck: Normal range of motion. Neck supple.  Cardiovascular: Regular rhythm.   HR 94  Pulmonary/Chest: Effort normal and breath sounds normal.  Abdominal: Soft. Bowel sounds are normal. He exhibits no distension and no mass. There is no tenderness. There is no rebound and no guarding.  Genitourinary:  Genitourinary Comments: Foley bag in place. Clear urine seen  Musculoskeletal: He exhibits no deformity.  Neurological: He is alert and oriented to person, place, and time.  Skin: Skin is warm.  Nursing note and vitals reviewed.    ED Treatments / Results  Labs (all labs ordered are listed, but only abnormal results are displayed) Labs Reviewed  I-STAT CHEM 8, ED - Abnormal; Notable for the following:       Result Value   Glucose, Bld 226 (*)    Calcium, Ion 1.11 (*)    Hemoglobin 11.6 (*)    HCT 34.0 (*)    All other components within normal limits  URINE CULTURE  URINALYSIS, ROUTINE W REFLEX MICROSCOPIC    EKG  EKG Interpretation None       Radiology No results found.  Procedures Procedures (including critical care time)  Medications Ordered in ED Medications  ibuprofen (ADVIL,MOTRIN) tablet 400 mg (400 mg Oral Given 01/05/17 1336)     Initial Impression / Assessment and Plan / ED Course  I have reviewed the triage vital signs and the nursing notes.  Pertinent labs & imaging results that were available during my care of the patient were reviewed by me and considered in my medical decision making (see chart for details).     Pt comes in for foley catheter removal. Pt is having some lower abd discomfort. On exam, abd is soft and there was no tenderness. Pt's urine look clean. No fevers, nausea, back pain.  Pt advised to see Urologist, but he insists that he has  called the clinic and no one is responding. Pt explained that removing foley catheter in the ER w/o proper bladder studies or looking into the cause of urinary retention might lead to repeat urinary retention and foley placement - and pt feels that he will be fine, but if he cannot urinate, he will return tot he ER.  Patient wants the foley removed against medical advice. Patient understands that his actions will lead to inadequate medical workup, and that he is at risk of complications of missed diagnosis, which includes morbidity and mortality.  Alternative options discussed - seeing Urologist [facility] to  change mind given. Patient is demonstrating good capacity to make decision. Patient understands that he/she needs to return to the ER immediately if his/her symptoms get worse.  Pt also has L sided stone - advised that he needs to see Urologist regardless.   Final Clinical Impressions(s) / ED Diagnoses   Final diagnoses:  Encounter for Foley catheter removal    New Prescriptions New Prescriptions   No medications on file     Derwood KaplanAnkit Latoya Maulding, MD 01/05/17 1711

## 2017-01-05 NOTE — ED Triage Notes (Signed)
Per Pt, Pt was having urinary retention 3 week ago and had urinary catheter placed. Pt reports missing two appointments to have it removed. Pt is coming to ED with request to have catheter remove and pain at the site.

## 2017-01-05 NOTE — ED Notes (Signed)
Provided patient with turkey sandwich and water.  

## 2017-01-06 LAB — URINE CULTURE

## 2017-01-22 ENCOUNTER — Encounter (HOSPITAL_COMMUNITY): Payer: Self-pay | Admitting: Emergency Medicine

## 2017-01-22 ENCOUNTER — Inpatient Hospital Stay (HOSPITAL_COMMUNITY)
Admission: EM | Admit: 2017-01-22 | Discharge: 2017-01-25 | DRG: 696 | Disposition: A | Discharge status: Home / Self Care | Payer: Self-pay | Attending: Family Medicine | Admitting: Family Medicine

## 2017-01-22 DIAGNOSIS — R0789 Other chest pain: Secondary | ICD-10-CM | POA: Diagnosis present

## 2017-01-22 DIAGNOSIS — Z7984 Long term (current) use of oral hypoglycemic drugs: Secondary | ICD-10-CM

## 2017-01-22 DIAGNOSIS — Z590 Homelessness: Secondary | ICD-10-CM

## 2017-01-22 DIAGNOSIS — N3000 Acute cystitis without hematuria: Secondary | ICD-10-CM | POA: Diagnosis present

## 2017-01-22 DIAGNOSIS — D649 Anemia, unspecified: Secondary | ICD-10-CM | POA: Diagnosis present

## 2017-01-22 DIAGNOSIS — Z720 Tobacco use: Secondary | ICD-10-CM

## 2017-01-22 DIAGNOSIS — F17210 Nicotine dependence, cigarettes, uncomplicated: Secondary | ICD-10-CM | POA: Diagnosis present

## 2017-01-22 DIAGNOSIS — R338 Other retention of urine: Principal | ICD-10-CM | POA: Diagnosis present

## 2017-01-22 DIAGNOSIS — Z7983 Long term (current) use of bisphosphonates: Secondary | ICD-10-CM

## 2017-01-22 DIAGNOSIS — K649 Unspecified hemorrhoids: Secondary | ICD-10-CM | POA: Diagnosis present

## 2017-01-22 DIAGNOSIS — N132 Hydronephrosis with renal and ureteral calculous obstruction: Secondary | ICD-10-CM | POA: Diagnosis present

## 2017-01-22 DIAGNOSIS — N390 Urinary tract infection, site not specified: Secondary | ICD-10-CM | POA: Insufficient documentation

## 2017-01-22 DIAGNOSIS — R339 Retention of urine, unspecified: Secondary | ICD-10-CM

## 2017-01-22 DIAGNOSIS — Z79899 Other long term (current) drug therapy: Secondary | ICD-10-CM

## 2017-01-22 DIAGNOSIS — Z87442 Personal history of urinary calculi: Secondary | ICD-10-CM

## 2017-01-22 DIAGNOSIS — E871 Hypo-osmolality and hyponatremia: Secondary | ICD-10-CM | POA: Diagnosis present

## 2017-01-22 DIAGNOSIS — E1142 Type 2 diabetes mellitus with diabetic polyneuropathy: Secondary | ICD-10-CM | POA: Diagnosis present

## 2017-01-22 DIAGNOSIS — Z833 Family history of diabetes mellitus: Secondary | ICD-10-CM

## 2017-01-22 DIAGNOSIS — E43 Unspecified severe protein-calorie malnutrition: Secondary | ICD-10-CM | POA: Insufficient documentation

## 2017-01-22 DIAGNOSIS — E1165 Type 2 diabetes mellitus with hyperglycemia: Secondary | ICD-10-CM | POA: Diagnosis present

## 2017-01-22 DIAGNOSIS — I10 Essential (primary) hypertension: Secondary | ICD-10-CM | POA: Diagnosis present

## 2017-01-22 DIAGNOSIS — F29 Unspecified psychosis not due to a substance or known physiological condition: Secondary | ICD-10-CM | POA: Diagnosis present

## 2017-01-22 DIAGNOSIS — R634 Abnormal weight loss: Secondary | ICD-10-CM | POA: Diagnosis present

## 2017-01-22 DIAGNOSIS — B9620 Unspecified Escherichia coli [E. coli] as the cause of diseases classified elsewhere: Secondary | ICD-10-CM | POA: Diagnosis present

## 2017-01-22 LAB — CBC
HEMATOCRIT: 34.4 % — AB (ref 39.0–52.0)
HEMOGLOBIN: 11.9 g/dL — AB (ref 13.0–17.0)
MCH: 32 pg (ref 26.0–34.0)
MCHC: 34.6 g/dL (ref 30.0–36.0)
MCV: 92.5 fL (ref 78.0–100.0)
PLATELETS: 293 10*3/uL (ref 150–400)
RBC: 3.72 MIL/uL — ABNORMAL LOW (ref 4.22–5.81)
RDW: 12.8 % (ref 11.5–15.5)
WBC: 12.4 10*3/uL — AB (ref 4.0–10.5)

## 2017-01-22 LAB — URINALYSIS, ROUTINE W REFLEX MICROSCOPIC
Bilirubin Urine: NEGATIVE
Glucose, UA: >=500 mg/dL — AB
Ketones, ur: 5 mg/dL — AB
Nitrite: POSITIVE — AB
PROTEIN: NEGATIVE mg/dL
Specific Gravity, Urine: 1.005 (ref 1.005–1.030)
pH: 6 (ref 5.0–8.0)

## 2017-01-22 LAB — BASIC METABOLIC PANEL
ANION GAP: 8 (ref 5–15)
BUN: 8 mg/dL (ref 6–20)
CALCIUM: 8.5 mg/dL — AB (ref 8.9–10.3)
CO2: 23 mmol/L (ref 22–32)
CREATININE: 0.8 mg/dL (ref 0.61–1.24)
Chloride: 91 mmol/L — ABNORMAL LOW (ref 101–111)
GFR calc Af Amer: >60 mL/min (ref >60)
GLUCOSE: 208 mg/dL — AB (ref 65–99)
Potassium: 3.5 mmol/L (ref 3.5–5.1)
Sodium: 122 mmol/L — ABNORMAL LOW (ref 135–145)

## 2017-01-22 LAB — HEPATIC FUNCTION PANEL
ALT: 11 U/L — ABNORMAL LOW (ref 17–63)
AST: 18 U/L (ref 15–41)
Albumin: 3.3 g/dL — ABNORMAL LOW (ref 3.5–5.0)
Alkaline Phosphatase: 83 U/L (ref 38–126)
BILIRUBIN DIRECT: 0.2 mg/dL (ref 0.1–0.5)
BILIRUBIN INDIRECT: 0.4 mg/dL (ref 0.3–0.9)
BILIRUBIN TOTAL: 0.6 mg/dL (ref 0.3–1.2)
Total Protein: 6.9 g/dL (ref 6.5–8.1)

## 2017-01-22 LAB — GLUCOSE, CAPILLARY: GLUCOSE-CAPILLARY: 243 mg/dL — AB (ref 65–99)

## 2017-01-22 LAB — ETHANOL: Alcohol, Ethyl (B): <5 mg/dL (ref <5)

## 2017-01-22 LAB — TROPONIN I

## 2017-01-22 MED ORDER — PSYLLIUM 95 % PO PACK
1.0000 | PACK | Freq: Every day | ORAL | Status: DC
  Administered 2017-01-23: 1 via ORAL
  Filled 2017-01-22 (×2): qty 1

## 2017-01-22 MED ORDER — TAMSULOSIN HCL 0.4 MG PO CAPS
0.4000 mg | ORAL_CAPSULE | Freq: Every day | ORAL | Status: DC
  Administered 2017-01-23 – 2017-01-25 (×3): 0.4 mg via ORAL
  Filled 2017-01-22 (×3): qty 1

## 2017-01-22 MED ORDER — HYDROCORTISONE 2.5 % RE CREA: TOPICAL_CREAM | Freq: Two times a day (BID) | RECTAL | Status: DC | PRN

## 2017-01-22 MED ORDER — AMLODIPINE BESYLATE 5 MG PO TABS: 5.0000 mg | ORAL_TABLET | Freq: Every day | ORAL | Status: DC

## 2017-01-22 MED ORDER — ENOXAPARIN SODIUM 40 MG/0.4ML ~~LOC~~ SOLN
40.0000 mg | SUBCUTANEOUS | Status: DC
  Administered 2017-01-22 – 2017-01-24 (×3): 40 mg via SUBCUTANEOUS
  Filled 2017-01-22 (×2): qty 0.4

## 2017-01-22 MED ORDER — FINASTERIDE 5 MG PO TABS
5.0000 mg | ORAL_TABLET | Freq: Every day | ORAL | Status: DC
  Administered 2017-01-23 – 2017-01-25 (×3): 5 mg via ORAL
  Filled 2017-01-22 (×3): qty 1

## 2017-01-22 MED ORDER — SODIUM CHLORIDE 0.9 % IV SOLN
Freq: Once | INTRAVENOUS | Status: AC
  Administered 2017-01-22: 18:00:00 via INTRAVENOUS

## 2017-01-22 MED ORDER — DEXTROSE 5 % IV SOLN
1.0000 g | Freq: Once | INTRAVENOUS | Status: AC
  Administered 2017-01-22: 1 g via INTRAVENOUS
  Filled 2017-01-22: qty 10

## 2017-01-22 MED ORDER — INSULIN ASPART 100 UNIT/ML ~~LOC~~ SOLN
0.0000 [IU] | Freq: Three times a day (TID) | SUBCUTANEOUS | Status: DC
  Administered 2017-01-23: 3 [IU] via SUBCUTANEOUS
  Administered 2017-01-23: 1 [IU] via SUBCUTANEOUS
  Administered 2017-01-23: 2 [IU] via SUBCUTANEOUS
  Administered 2017-01-24 (×2): 1 [IU] via SUBCUTANEOUS
  Administered 2017-01-24: 7 [IU] via SUBCUTANEOUS
  Administered 2017-01-25: 1 [IU] via SUBCUTANEOUS
  Administered 2017-01-25: 2 [IU] via SUBCUTANEOUS

## 2017-01-22 MED ORDER — ACETAMINOPHEN 650 MG RE SUPP: 650.0000 mg | Freq: Four times a day (QID) | RECTAL | Status: DC | PRN

## 2017-01-22 MED ORDER — LORAZEPAM 2 MG/ML IJ SOLN: 1.0000 mg | Freq: Four times a day (QID) | INTRAMUSCULAR | Status: DC | PRN

## 2017-01-22 MED ORDER — SENNOSIDES-DOCUSATE SODIUM 8.6-50 MG PO TABS
1.0000 | ORAL_TABLET | Freq: Every evening | ORAL | Status: DC | PRN
  Filled 2017-01-22: qty 1

## 2017-01-22 MED ORDER — LORAZEPAM 1 MG PO TABS: 1.0000 mg | ORAL_TABLET | Freq: Four times a day (QID) | ORAL | Status: DC | PRN

## 2017-01-22 MED ORDER — PANTOPRAZOLE SODIUM 20 MG PO TBEC
20.0000 mg | DELAYED_RELEASE_TABLET | Freq: Every day | ORAL | Status: DC
  Administered 2017-01-22 – 2017-01-25 (×4): 20 mg via ORAL
  Filled 2017-01-22 (×4): qty 1

## 2017-01-22 MED ORDER — SODIUM CHLORIDE 0.9 % IV BOLUS (SEPSIS)
1000.0000 mL | Freq: Once | INTRAVENOUS | Status: AC
  Administered 2017-01-22: 1000 mL via INTRAVENOUS

## 2017-01-22 MED ORDER — ENSURE ENLIVE PO LIQD
237.0000 mL | Freq: Two times a day (BID) | ORAL | Status: DC
  Administered 2017-01-23: 237 mL via ORAL

## 2017-01-22 MED ORDER — FENTANYL CITRATE (PF) 100 MCG/2ML IJ SOLN
50.0000 ug | Freq: Once | INTRAMUSCULAR | Status: AC
  Administered 2017-01-22: 50 ug via INTRAVENOUS
  Filled 2017-01-22: qty 2

## 2017-01-22 MED ORDER — THIAMINE HCL 100 MG/ML IJ SOLN: 100.0000 mg | Freq: Every day | INTRAMUSCULAR | Status: DC

## 2017-01-22 MED ORDER — SODIUM CHLORIDE 0.9 % IV SOLN
INTRAVENOUS | Status: DC
  Administered 2017-01-23: 125 mL/h via INTRAVENOUS
  Administered 2017-01-23: 16:00:00 via INTRAVENOUS

## 2017-01-22 MED ORDER — ADULT MULTIVITAMIN W/MINERALS CH
1.0000 | ORAL_TABLET | Freq: Every day | ORAL | Status: DC
  Administered 2017-01-22 – 2017-01-25 (×4): 1 via ORAL
  Filled 2017-01-22 (×4): qty 1

## 2017-01-22 MED ORDER — NICOTINE 21 MG/24HR TD PT24
21.0000 mg | MEDICATED_PATCH | Freq: Every day | TRANSDERMAL | Status: DC
  Administered 2017-01-22 – 2017-01-25 (×4): 21 mg via TRANSDERMAL
  Filled 2017-01-22 (×4): qty 1

## 2017-01-22 MED ORDER — FOLIC ACID 1 MG PO TABS
1.0000 mg | ORAL_TABLET | Freq: Every day | ORAL | Status: DC
  Administered 2017-01-22 – 2017-01-25 (×4): 1 mg via ORAL
  Filled 2017-01-22 (×4): qty 1

## 2017-01-22 MED ORDER — DEXTROSE 5 % IV SOLN
1.0000 g | INTRAVENOUS | Status: DC
  Administered 2017-01-23 – 2017-01-24 (×2): 1 g via INTRAVENOUS
  Filled 2017-01-22 (×3): qty 10

## 2017-01-22 MED ORDER — ACETAMINOPHEN 325 MG PO TABS
650.0000 mg | ORAL_TABLET | Freq: Four times a day (QID) | ORAL | Status: DC | PRN
  Filled 2017-01-22: qty 2

## 2017-01-22 MED ORDER — VITAMIN B-1 100 MG PO TABS
100.0000 mg | ORAL_TABLET | Freq: Every day | ORAL | Status: DC
  Administered 2017-01-22 – 2017-01-25 (×4): 100 mg via ORAL
  Filled 2017-01-22 (×4): qty 1

## 2017-01-22 NOTE — ED Triage Notes (Addendum)
Pt brought in by [facility], reports urinary retention and penile pain for the past few days, unable to provide much more information per EMS. Pt reports diagnosed with UTI a few weeks ago, was unable to get medications he was supposed to get

## 2017-01-22 NOTE — H&P (Signed)
Family Medicine Teaching [facility] Admission History and Physical Service Pager: [phone]  Patient name: Mark Graham Medical record number: [id number] Date of birth: 07-29-1956 Age: 62 y.o. Gender: male  Primary Care Provider: No PCP Per Patient Consultants: None Code Status: Full  Chief Complaint: urinary retention  Assessment and Plan: Mark Graham is a 61 y.o. male presenting with urinary retention. PMH is significant for HTN, tobacco abuse, T2DM with peripheral neuropathy, kidney stones, and housing instability.   Urinary retention: Over 1 L retained on bladder scan. ED physician reported resistance in distal penis with catheter placement, concerning for possible stricture. Had elevated PSA last admission but in setting of retention, as well. No episodes prior to this year but says he was told he had a narrow urethra in 2015. Possible causes include BPH, urethral stricture, constipation, mass, and kidney stones.  - Admit to inpatient, attending Dr. Jennette Kettle - Indwelling catheter placed (coude) - Restart flomax and finasteride, as had some weak voiding prior to complete retention - Will need to follow with Urology but finances are limiting; will consult Social Work - Bowel regimen with metamucil, as has hemorrhoids and hard stools - Will need follow-up with Urology  UTI: UA with nitrites, leukocytes, small leukocytes, ketones, glucose, and many bacteria. Has suprapubic tenderness. Also with leukocytosis to 12.4 but afebrile. Was prescribed keflex at ED visit 01/05/17 after foley removal but never took.  - s/p IV ceftriaxone in ED; repeat in 24 hours if no culture data or consider empiric treatment with keflex vs 1 time dose of fosomycin - ordered urine culture and gram stain - F/u urine culture  Hyponatremia: 122 on admission, 124 when corrected for hyperglycemia. Alert and without neuro deficits. May be hypovolemic hyponatremia with tacky mucous membranes on exam, ketones on UA and  reported limited PO intake in general and especially over last few days; drinks a lot of coffee not water.   - Isotonic fluids - Recheck BMP in a.m. - Obtain urine osmolality and urine sodium  - Monitor I/Os  History of kidney stones: Denies flank or groin pain. Mild hydronephrosis but non-obstructing 2.2 cm stone in L renal pelvis noted 12/14/16. - Repeat renal US  Chest pains: Not currently present. Chronic and intermittent. EKG unchanged from previous with borderline ST elevations in anterior leads and left axis deviation. ACS risk factors of age, smoking history, and HTN. Normal ECHO during last admission. - Trend troponins - Repeat EKG in am  HTN: Elevated to 168/105 on admission but improved to 121/73 after foley placement. Has been on norvasc but reports not being able to afford any medications regularly.  - Restart amlodipine tomorrow a.m. if BP elevated  T2DM: Hgb A1c 7.2 on 12/13/16. Glucose in urine but no anion gap. Has been prescribed glipizide in past.  - SSI, sensitive scale  Tobacco abuse: Patient expressed interest in smoking cessation. - Ordered nicotine patch - Could ask pharmacy to provide further education  History of EtOH abuse: Alcohol level < 5 with reported last drink about 3 weeks ago.  - Monitor on CIWA protocol  Body cramps: May be 2/2 peripheral neuropathy. No longer on gabapentin (unclear why). Worse over last 6 months. Mildly anemic (11.9 hbg) but normal MCV and elevated iron (198) 12/13/16.  - Obtain CK, vitamin D level   Social Issues: - Consulted SW for homelessness issues and lack of insurance - Consulted CM for medication assistance help  FEN/GI: Carb modified diet, protonix  Prophylaxis: Lovenox  Disposition: Pending  improvement in hyponatremia and transition to PO antibiotics.   History of Present Illness:  Mark Graham is a 61 y.o. male presenting with acute urinary retention times 1 day. Patient had similar episode in February, requiring  indwelling foley catheter. He was found to have a non-obstructing kidney stone at that time and was discharged on finasteride and flomax but did not take. He was discharged with foley due to failing voiding trials and was to follow-up with Urology but says he could not afford co-pay, bus fare to get to appointment and was quoted that removal of foley would cost over $200. Foley was in place for over 3 more weeks; then patient presented to ED 3/13 for removal. Trouble with voiding began again Monday (3/26) with it being "hard to pee." Has had some dysuria and has been unable to fully void; having pressure-like pain in his abdomen. He did not urinate at all since yesterday morning and had difficulty having a BM until yesterday evening; this was hard little balls of stool and made his hemorrhoids bleed. He presented due to worsening pain in lower abdomen; this resolved after placement of foley catheter in ED.  In addition, patient notes chest pains Monday and Tuesday that were sharp and uncomfortable; they persisted and not worsened by exertion or movement.  He also reports cramps throughout his body and tingling in his feet that have been worse over the last 6 months.  Review Of Systems: Per HPI with the following additions:  Review of Systems  Constitutional: Negative for chills and fever.  HENT: Negative for congestion and sinus pain.   Respiratory: Negative for cough and shortness of breath.   Cardiovascular: Positive for chest pain. Negative for palpitations.  Gastrointestinal: Positive for constipation. Negative for nausea and vomiting.  Genitourinary: Positive for dysuria.  Musculoskeletal: Positive for myalgias. Negative for falls.  Skin: Negative for rash.  Neurological: Negative for focal weakness and headaches.    Patient Active Problem List   Diagnosis Date Noted  . Psychosis 12/13/2016  . Weight loss 12/13/2016  . Abnormal ECG 12/13/2016  . Iron overload 12/13/2016  . Elevated  bilirubin 12/13/2016  . PSA elevation 12/13/2016  . Diabetes mellitus (HCC) 12/12/2016  . Tobacco abuse 10/21/2015  . Hyperglycemia 10/21/2015  . Urinary frequency 10/21/2015  . RUQ abdominal pain 10/21/2015  . Hypertension 03/03/2014  . Hyponatremia 03/03/2014  . Chest pain 03/03/2014  . Leukocytosis 03/03/2014  . Acute urinary retention 03/03/2014    Past Medical History: Past Medical History:  Diagnosis Date  . Diabetes mellitus without complication (HCC)   . Hypertension   . Tobacco abuse     Past Surgical History: History reviewed. No pertinent surgical history.  Social History: Social History  Substance Use Topics  . Smoking status: Current Every Day Smoker    Packs/day: 1.00    Types: Cigarettes  . Smokeless tobacco: Current User  . Alcohol use Yes     Comment: drinks when he has any money   Additional social history: [place] think [place] is a safe area. Has been smoking about 1 PPD for about 45 years. Smokes unfiltered cigarettes. Last drank EtOH 5th or 6th of March but reports trouble with alcohol abuse in past. Remote (30+ years ago) use of speed and marijuana.  Please also refer to relevant sections of EMR.  Family History: Family History  Problem Relation Age of Onset  . Cancer Father   . Cancer Sister   .  Diabetes Mellitus II      States 6 out of 13 of his siblings     Allergies and Medications: No Known Allergies No current facility-administered medications on file prior to encounter.    Current Outpatient Prescriptions on File Prior to Encounter  Medication Sig Dispense Refill  . amLODipine (NORVASC) 5 MG tablet Take 1 tablet (5 mg total) by mouth daily. 60 tablet 0  . cephALEXin (KEFLEX) 500 MG capsule Take 1 capsule (500 mg total) by mouth 4 (four) times daily. 14 capsule 0  . finasteride (PROSCAR) 5 MG tablet Take 1 tablet (5 mg total) by mouth daily. 120 tablet 0  . folic acid (FOLVITE) 1 MG tablet Take 1 tablet (1 mg  total) by mouth daily. 30 tablet 0  . glipiZIDE (GLUCOTROL) 5 MG tablet Take 0.5 tablets (2.5 mg total) by mouth daily before breakfast. 30 tablet 0  . hydrocortisone (ANUSOL-HC) 2.5 % rectal cream Place rectally 2 (two) times daily as needed for hemorrhoids or itching. 30 g 0  . ibuprofen (ADVIL,MOTRIN) 200 MG tablet Take 200 mg by mouth every 6 (six) hours as needed for moderate pain.    . nicotine (NICODERM CQ) 21 mg/24hr patch Place 1 patch (21 mg total) onto the skin daily. (Patient not taking: Reported on 12/22/2015) 28 patch 0  . QUEtiapine (SEROQUEL) 25 MG tablet Take 1 tablet (25 mg total) by mouth at bedtime. 60 tablet 0  . tamsulosin (FLOMAX) 0.4 MG CAPS capsule Take 1 capsule (0.4 mg total) by mouth daily. 120 capsule 0  . thiamine 100 MG tablet Take 1 tablet (100 mg total) by mouth daily. (Patient not taking: Reported on 01/05/2017) 60 tablet 0  . vitamin B-12 1000 MCG tablet Take 1 tablet (1,000 mcg total) by mouth daily. (Patient not taking: Reported on 01/05/2017) 60 tablet 0    Objective: BP 134/84   Pulse 73   Temp 99 F (37.2 C) (Oral)   Resp 14   SpO2 97%  Exam: General: Very pleasant gentleman who appears older than stated age, resting in bed Eyes: PERRLA, EOMI ENTM: Minimal nasal congestion, edentulous, MM slightly tacky Neck: FROM, supple Cardiovascular: RRR, S1, S2, no mrg Respiratory: CTAB, no increased WOB Gastrointestinal: +BS, suprapubic tenderness on exam MSK: No LE edema, moves all limbs spontaneously, normal tone and bulk GU: Normal penis (uncircumcised), no swelling or erythema of scrotum Derm: No rashes, tanned skin Neuro: AOx3, no focal deficits Psych: Answers questions appropriately but somewhat tangential  Labs and Imaging: CBC BMET   Recent Labs Lab 01/22/17 1415  WBC 12.4*  HGB 11.9*  HCT 34.4*  PLT 293    Recent Labs Lab 01/22/17 1415  NA 122*  K 3.5  CL 91*  CO2 23  BUN 8  CREATININE 0.80  GLUCOSE 208*  CALCIUM 8.5*       Yalda Herd Percell Boston, MD 01/22/2017, 7:42 PM PGY-2, [facility] Family Medicine FPTS Intern pager: [phone], text pages welcome

## 2017-01-22 NOTE — ED Provider Notes (Signed)
[facility] DEPT Provider Note   CSN: [id number] Arrival date & time: 01/22/17  1313     History   Chief Complaint Chief Complaint  Patient presents with  . Urinary Retention  . Penis Pain    HPI Mark Graham is a 61 y.o. male.  HPI Mark Graham is a 61 y.o. male presents to emergency department complaining of suprapubic and penile pain and difficulty urinating. Patient states he had similar symptoms about a month ago. He was admitted to the hospital that time for urinary retention and hyponatremia. Patient was discharged with a Foley catheter. He was supposed to follow with urology but did not and to return to emergency department 2 weeks ago for Foley removal. His Foley was removed at that time. He states that he has been having minimal trouble urinating but was able to void for the most part since then up until 2 days ago. He states in the last few days he has had urgency and small amounts that he voids at a time but feels like he cannot empty his bladder completely. Today developed abdominal pain and penile pain. Denies any pain in his scrotum. There has not been any injuries. He denies any fever or chills. He states that he has not been drinking enough water, denies alcohol. He reports he feels like he could be dehydrated.  Past Medical History:  Diagnosis Date  . Diabetes mellitus without complication ([place])   . Hypertension   . Tobacco abuse     Patient Active Problem List   Diagnosis Date Noted  . Psychosis 12/13/2016  . Weight loss 12/13/2016  . Abnormal ECG 12/13/2016  . Iron overload 12/13/2016  . Elevated bilirubin 12/13/2016  . PSA elevation 12/13/2016  . Diabetes mellitus ([place]) 12/12/2016  . Tobacco abuse 10/21/2015  . Hyperglycemia 10/21/2015  . Urinary frequency 10/21/2015  . RUQ abdominal pain 10/21/2015  . Hypertension 03/03/2014  . Hyponatremia 03/03/2014  . Chest pain 03/03/2014  . Leukocytosis 03/03/2014  . Acute urinary retention 03/03/2014     History reviewed. No pertinent surgical history.     Home Medications    Prior to Admission medications   Medication Sig Start Date End Date Taking? Authorizing Provider  amLODipine (NORVASC) 5 MG tablet Take 1 tablet (5 mg total) by mouth daily. 12/14/16   Lavina Hamman, MD  cephALEXin (KEFLEX) 500 MG capsule Take 1 capsule (500 mg total) by mouth 4 (four) times daily. 01/05/17   Varney Biles, MD  finasteride (PROSCAR) 5 MG tablet Take 1 tablet (5 mg total) by mouth daily. 12/14/16   Lavina Hamman, MD  folic acid (FOLVITE) 1 MG tablet Take 1 tablet (1 mg total) by mouth daily. 12/14/16   Lavina Hamman, MD  glipiZIDE (GLUCOTROL) 5 MG tablet Take 0.5 tablets (2.5 mg total) by mouth daily before breakfast. 12/14/16   Lavina Hamman, MD  hydrocortisone (ANUSOL-HC) 2.5 % rectal cream Place rectally 2 (two) times daily as needed for hemorrhoids or itching. 12/14/16   Lavina Hamman, MD  ibuprofen (ADVIL,MOTRIN) 200 MG tablet Take 200 mg by mouth every 6 (six) hours as needed for moderate pain.    Historical Provider, MD  nicotine (NICODERM CQ) 21 mg/24hr patch Place 1 patch (21 mg total) onto the skin daily. Patient not taking: Reported on 12/22/2015 10/21/15   Reyne Dumas, MD  QUEtiapine (SEROQUEL) 25 MG tablet Take 1 tablet (25 mg total) by mouth at bedtime. 12/14/16   Lavina Hamman, MD  tamsulosin (FLOMAX) 0.4 MG CAPS capsule Take 1 capsule (0.4 mg total) by mouth daily. 12/14/16   Lavina Hamman, MD  thiamine 100 MG tablet Take 1 tablet (100 mg total) by mouth daily. Patient not taking: Reported on 01/05/2017 12/14/16   Lavina Hamman, MD  vitamin B-12 1000 MCG tablet Take 1 tablet (1,000 mcg total) by mouth daily. Patient not taking: Reported on 01/05/2017 12/14/16   Lavina Hamman, MD    Family History Family History  Problem Relation Age of Onset  . Cancer Father   . Cancer Sister   . Diabetes Mellitus II      States 6 out of 13 of his siblings     Social History Social History   Substance Use Topics  . Smoking status: Current Every Day Smoker    Packs/day: 1.00    Types: Cigarettes  . Smokeless tobacco: Current User  . Alcohol use Yes     Comment: drinks when he has any money     Allergies   Patient has no known allergies.   Review of Systems Review of Systems  Constitutional: Negative for chills and fever.  Respiratory: Negative for cough, chest tightness and shortness of breath.   Cardiovascular: Negative for chest pain, palpitations and leg swelling.  Gastrointestinal: Positive for abdominal pain. Negative for abdominal distention, diarrhea, nausea and vomiting.  Genitourinary: Positive for difficulty urinating, frequency, penile pain and urgency. Negative for discharge, dysuria, hematuria, penile swelling and scrotal swelling.  Musculoskeletal: Negative for arthralgias, myalgias, neck pain and neck stiffness.  Skin: Negative for rash.  Allergic/Immunologic: Negative for immunocompromised state.  Neurological: Negative for dizziness, weakness, light-headedness, numbness and headaches.  All other systems reviewed and are negative.    Physical Exam Updated Vital Signs BP (!) 168/105   Pulse 82   Temp 97.9 F (36.6 C) (Oral)   Resp 14   SpO2 99%   Physical Exam  Constitutional: He appears well-developed and well-nourished. No distress.  HENT:  Head: Normocephalic and atraumatic.  Eyes: Conjunctivae are normal.  Neck: Neck supple.  Cardiovascular: Normal rate, regular rhythm and normal heart sounds.   Pulmonary/Chest: Effort normal. No respiratory distress. He has no wheezes. He has no rales.  Abdominal: Soft. Bowel sounds are normal. He exhibits no distension. There is tenderness. There is no rebound.  Suprapubic tenderness  Genitourinary: Penis normal.  Genitourinary Comments: Normal scrotum bilaterally with no swelling, no erythema, no tenderness.  Musculoskeletal: He exhibits no edema.  Neurological: He is alert.  Skin: Skin is warm  and dry.  Nursing note and vitals reviewed.    ED Treatments / Results  Labs (all labs ordered are listed, but only abnormal results are displayed) Labs Reviewed  URINALYSIS, ROUTINE W REFLEX MICROSCOPIC - Abnormal; Notable for the following:       Result Value   APPearance HAZY (*)    Glucose, UA >=500 (*)    Hgb urine dipstick SMALL (*)    Ketones, ur 5 (*)    Nitrite POSITIVE (*)    Leukocytes, UA LARGE (*)    Bacteria, UA MANY (*)    Squamous Epithelial / LPF 0-5 (*)    All other components within normal limits  CBC - Abnormal; Notable for the following:    WBC 12.4 (*)    RBC 3.72 (*)    Hemoglobin 11.9 (*)    HCT 34.4 (*)    All other components within normal limits  BASIC METABOLIC PANEL - Abnormal; Notable for  the following:    Sodium 122 (*)    Chloride 91 (*)    Glucose, Bld 208 (*)    Calcium 8.5 (*)    All other components within normal limits  HEPATIC FUNCTION PANEL - Abnormal; Notable for the following:    Albumin 3.3 (*)    ALT 11 (*)    All other components within normal limits  URINE CULTURE  ETHANOL  OSMOLALITY, URINE  SODIUM, URINE, RANDOM    EKG  EKG Interpretation  Date/Time:  Friday January 22 2017 18:41:48 EDT Ventricular Rate:  75 PR Interval:    QRS Duration: 110 QT Interval:  421 QTC Calculation: 471 R Axis:   -48 Text Interpretation:  Sinus rhythm LAD, consider left anterior fascicular block Borderline ST elevation, anterior leads similar to prior EKG  Confirmed by LIU MD, DANA ([id number]) on 01/22/2017 7:40:55 PM       Radiology No results found.  Procedures Procedures (including critical care time)  Medications Ordered in ED Medications  sodium chloride 0.9 % bolus 1,000 mL (not administered)  0.9 %  sodium chloride infusion (not administered)     Initial Impression / Assessment and Plan / ED Course  I have reviewed the triage vital signs and the nursing notes.  Pertinent labs & imaging results that were available during  my care of the patient were reviewed by me and considered in my medical decision making (see chart for details).     Patient in emergency department with possible urinary retention. Will get bladder scanner to assess amount of urinary bladder. If greater than 300 we'll place a Foley catheter.  5:20 PM Greater than 999 mL of fluid in the bladder on the scanner, [address] Foley catheter. Patient's sodium is 122, this is an acute decline from his baseline. He was admitted for the same just a month ago. He denies drinking beer recently. He does state he feels dehydrated because he hasn't been drinking enough water. I will start IV fluids. UA pending  Called in by a nurse, for failed attempts to place a Foley catheter. I attempted myself, catheter met resistance approximately one inch into urethral opening. Unable to insert the Foley catheter. Ordered coude cath was placed with no major problems. 1400 mL of urine in Foley bag, after clamping it at 1 L.   7:51 PM  Patient feels much better. He does have elevation with vessel count of 12.4. His urinalysis shows large leukocytes, positive nitrites, too numerous to count white blood cells, many bacteria. We'll cover with Rocephin, culture sent. He does not appear to be septic. Fluids are running for his hyponatremia.  I spoke with family practice, they will come by and see pt.   Vitals:   01/22/17 1815 01/22/17 1833 01/22/17 1900 01/22/17 1930  BP: (!) 161/88 (!) 145/90 133/85 134/84  Pulse: 83 82 77 73  Resp:  17 14 14   Temp:  99 F (37.2 C)    TempSrc:  Oral    SpO2: 100% 95% 96% 97%     Final Clinical Impressions(s) / ED Diagnoses   Final diagnoses:  Hyponatremia  Urinary retention  Urinary tract infection without hematuria, site unspecified    New Prescriptions New Prescriptions   No medications on file     Jeannett Senior, PA-C 01/22/17 [place] Duo Liu, MD 01/22/17 2138

## 2017-01-23 ENCOUNTER — Inpatient Hospital Stay (HOSPITAL_COMMUNITY): Payer: Self-pay

## 2017-01-23 LAB — OSMOLALITY, URINE: Osmolality, Ur: 252 mOsm/kg — ABNORMAL LOW (ref 300–900)

## 2017-01-23 LAB — GLUCOSE, CAPILLARY
GLUCOSE-CAPILLARY: 127 mg/dL — AB (ref 65–99)
Glucose-Capillary: 177 mg/dL — ABNORMAL HIGH (ref 65–99)
Glucose-Capillary: 179 mg/dL — ABNORMAL HIGH (ref 65–99)
Glucose-Capillary: 253 mg/dL — ABNORMAL HIGH (ref 65–99)

## 2017-01-23 LAB — GRAM STAIN

## 2017-01-23 LAB — CBC
HCT: 34 % — ABNORMAL LOW (ref 39.0–52.0)
HEMOGLOBIN: 11.4 g/dL — AB (ref 13.0–17.0)
MCH: 31.5 pg (ref 26.0–34.0)
MCHC: 33.5 g/dL (ref 30.0–36.0)
MCV: 93.9 fL (ref 78.0–100.0)
Platelets: 301 10*3/uL (ref 150–400)
RBC: 3.62 MIL/uL — ABNORMAL LOW (ref 4.22–5.81)
RDW: 13 % (ref 11.5–15.5)
WBC: 9.1 10*3/uL (ref 4.0–10.5)

## 2017-01-23 LAB — BASIC METABOLIC PANEL
ANION GAP: 10 (ref 5–15)
CALCIUM: 8.5 mg/dL — AB (ref 8.9–10.3)
CO2: 24 mmol/L (ref 22–32)
CREATININE: 0.82 mg/dL (ref 0.61–1.24)
Chloride: 103 mmol/L (ref 101–111)
GFR calc Af Amer: >60 mL/min (ref >60)
GFR calc non Af Amer: >60 mL/min (ref >60)
GLUCOSE: 147 mg/dL — AB (ref 65–99)
Potassium: 3.3 mmol/L — ABNORMAL LOW (ref 3.5–5.1)
Sodium: 137 mmol/L (ref 135–145)

## 2017-01-23 LAB — TROPONIN I
Troponin I: <0.03 ng/mL (ref <0.03)
Troponin I: <0.03 ng/mL (ref <0.03)

## 2017-01-23 LAB — CK: CK TOTAL: 59 U/L (ref 49–397)

## 2017-01-23 LAB — SODIUM, URINE, RANDOM: Sodium, Ur: 27 mmol/L

## 2017-01-23 MED ORDER — ENSURE ENLIVE PO LIQD
237.0000 mL | Freq: Three times a day (TID) | ORAL | Status: DC
  Administered 2017-01-23 – 2017-01-25 (×6): 237 mL via ORAL

## 2017-01-23 NOTE — Progress Notes (Signed)
Initial Nutrition Assessment  DOCUMENTATION CODES:   Severe malnutrition in context of social or environmental circumstances  INTERVENTION:  -Increase Ensure Enlive po to TID, each supplement provides 350 kcal and 20 grams of protein -Continue MVI with minerals  NUTRITION DIAGNOSIS:   Malnutrition (Severe) related to social / environmental circumstances (EtOH abuse, food insecurity, housing issues) as evidenced by percent weight loss, severe depletion of muscle mass, severe depletion of body fat.  GOAL:   Patient will meet greater than or equal to 90% of their needs  MONITOR:   PO intake, Supplement acceptance, Labs, Weight trends  REASON FOR ASSESSMENT:   Malnutrition Screening Tool    ASSESSMENT:    61 yo male admitted with urinary retention, hyponatremia; hx of DM, HTN. Hx of EtOH on CIWA protocol. Hx of housing instability per chart review  No recorded po intake but pt reports eating 100% of Breakfast this AM, pt also drank 100% of EnsureEnlive. Pt reports po intake prior to admission was poor due to fair appetite and lack of access to food. Pt lives alone and receives food stamps, very limited income, reports stock of food at home is low at present. Discussed low cost meal options such as beans/rice, peanut butter and jelly sandwiches, frozen veggies/LS canned veggies instead of fresh, etc; also discussed utilizing [employer] mixed with milk as cheaper alternative to Ensure/Boost  Pt reports 12-14 pound wt loss in past 1 month (>/= 7.2% wt loss in 1 month which is significant for time frame); pt reports he weighed 172 pounds 1 month ago, current wt 154 pounds (10.5% wt loss in 1 month which is significant for time frame). Weight of 172 pounds in Feb 2018 per weight encounters.  Nutrition-Focused physical exam completed. Findings are moderate to severe fat depletion, mild/moderate to severe muscle depletion, and no edema.   Labs: sodium 122, CBGs 127-243,  ionized calcium 1.11 (L) Meds: folic acid, ss novolog, MVI with minerals, psyllium, thamine, NS at 125 ml/hr  Diet Order:  Diet Carb Modified Fluid consistency: Thin; Room service appropriate? Yes  Skin:  Reviewed, no issues  Last BM:  01/21/17  Height:   Ht Readings from Last 1 Encounters:  01/05/17  (1.803 m)    Weight:   Wt Readings from Last 1 Encounters:  01/22/17 154 lb 6.4 oz (70 kg)    BMI:  Body mass index is 21.53 kg/m.  Estimated Nutritional Needs:   Kcal:  1800-2100 kcals  Protein:  90-105 g   Fluid:  >/= 1.8 L  EDUCATION NEEDS:   Education needs addressed  Romelle Starcher MS, RD, LDN [phone] Pager  [phone] Weekend/On-Call Pager

## 2017-01-23 NOTE — Care Management Note (Addendum)
Case Management Note  Patient Details  Name: Mark Graham MRN: [id number] Date of Birth: 05-Dec-1955  Subjective/Objective: CM received consult for this 61 y.o. M who has been seen by [facility] in the past month but continues to need assistance with medications. Unable to provide MATCH as he was provided this assistance 12/14/2016  And CM will encourage him to attend Open Clinic Hours on 01/28/2017 @ 08:30 where he can receive assist with Insurance, Medication and PCP. Instructed to have Rx filled prior to appt/consult on Thursday. Unsure when this pt will be discharged.                  Action/Plan:CM will follow closely for disposition/discharge needs.    Expected Discharge Date:  01/24/17               Expected Discharge Plan:  Home/Self Care  In-House Referral:  PCP / [facility]  Discharge planning Services  CM Consult, MATCH Program, Medication Assistance, Follow-up appt scheduled  Post Acute Care Choice:  NA Choice offered to:  Patient  DME Arranged:  Other see comment DME Agency:  NA  HH Arranged:  NA HH Agency:  NA  Status of Service:  In process, will continue to follow  If discussed at Long Length of Stay Meetings, dates discussed:    Additional Comments:  Yvone Neu, RN 01/23/2017, 9:01 AM

## 2017-01-23 NOTE — Progress Notes (Signed)
Family Medicine Teaching Service Daily Progress Note Intern Pager: [phone]  Patient name: Mark Graham Medical record number: [id number] Date of birth: 11-10-1955 Age: 61 y.o. Gender: male  Primary Care Provider: No PCP Per Patient Consultants: None Code Status: Full  Chief Complaint: urinary retention  Assessment and Plan: KENSTON LONGTON is a 61 y.o. male presenting with urinary retention. PMH is significant for HTN, tobacco abuse, T2DM with peripheral neuropathy, kidney stones, and housing instability.   Urinary retention: Over 1 L retained on bladder scan on admission. ED physician reported resistance in distal penis with catheter placement, concerning for possible stricture. Had elevated PSA last admission but in setting of retention, as well. No episodes prior to this year but says he was told he had a narrow urethra in 2015. Possible causes include BPH, urethral stricture, constipation, mass, and kidney stones. No tenderness or gross enlargement of prostate on rectal exam.  - Admitted to inpatient, attending Dr. Jennette Kettle - Coude catheter in place - Continue flomax and finasteride, as had some weak voiding prior to complete retention - Will need to follow with Urology but finances are limiting; will consult Social Work - Bowel regimen with metamucil, as has hemorrhoids and hard stools - Will need follow-up with Urology  UTI: UA with nitrites, leukocytes, small leukocytes, ketones, glucose, and many bacteria. Has suprapubic tenderness. Also with leukocytosis to 12.4 but afebrile. Was prescribed keflex at ED visit 01/05/17 after foley removal but never took. Budding yeast on gram stain.  - s/p IV ceftriaxone in ED; repeat in 24 hours if no culture data or consider empiric treatment with keflex vs 1 time dose of fosomycin - F/u urine culture  Hyponatremia, resolved: 137 this a.m. 122 on admission, 124 when corrected for hyperglycemia. Alert and without neuro deficits. Suspect hypovolemic  hyponatremia given quick resolution.  - Recheck BMP in a.m. - Obtain urine osmolality and urine sodium  - Monitor I/Os  History of kidney stones: Denies flank or groin pain. Mild hydronephrosis but non-obstructing 2.2 cm stone in L renal pelvis noted 12/14/16. - Repeat renal US  Chest pains: None since admission. Chronic and intermittent. EKG unchanged from previous with borderline ST elevations in anterior leads and left axis deviation. ACS risk factors of age, smoking history, and HTN. Normal ECHO during last admission. Repeat EKG this am stable from admission (still with possible slight ST elevations in anterior leads). - Trend troponins (<0.03 x 2)  HTN, controlled: BP 136/84 this a.m. Elevated to 168/105 on admission but improved to 121/73 after foley placement. Has been on norvasc but reports not being able to afford any medications regularly.  - Restart amlodipine if BP elevated  T2DM: Hgb A1c 7.2 on 12/13/16. Glucose in urine but no anion gap. Has been prescribed glipizide in past.  - SSI, sensitive scale  Tobacco abuse: Patient expressed interest in smoking cessation. - Ordered nicotine patch - Could ask pharmacy to provide further education  History of EtOH abuse: Alcohol level < 5 with reported last drink about 3 weeks ago.  - Monitor on CIWA protocol  Body cramps: May be 2/2 peripheral neuropathy. No longer on gabapentin (unclear why). Worse over last 6 months. Mildly anemic (11.9 hbg) but normal MCV and elevated iron (198) 12/13/16.  - Obtain CK, vitamin D level   Social Issues: - Consulted SW for homelessness issues and lack of insurance - Consulted CM for medication assistance help  FEN/GI: Carb modified diet, protonix  Prophylaxis: Lovenox  Disposition: Pending improvement in  hyponatremia and transition to PO antibiotics.   Disposition: Home pending transition to PO antibiotics and coordination of follow-up.   Subjective:  Abdominal pain improved this  a.m. Patient slept well and tolerated breakfast.   Objective: Temp:  [97.4 F (36.3 C)-99 F (37.2 C)] 98.9 F (37.2 C) (03/31 0754) Pulse Rate:  [73-99] 95 (03/31 0754) Resp:  [14-20] 20 (03/31 0754) BP: (121-187)/(73-119) 136/84 (03/31 0754) SpO2:  [94 %-100 %] 94 % (03/31 0754) Weight:  [154 lb 6.4 oz (70 kg)] 154 lb 6.4 oz (70 kg) (03/30 2216) Physical Exam: General: Very pleasant gentleman who appears older than stated age, resting in bed ENTM: MM moist Cardiovascular: RRR, S1, S2, no mrg Respiratory: CTAB, no increased WOB Gastrointestinal: +BS, suprapubic tenderness on exam MSK: No LE edema, moves all limbs spontaneously, normal tone and bulk GU: Circumferential external hemorrhoids on rectal exam; no pain with rectal exam and no internal hemorrhoids appreciated; prostate felt smooth, was non-tender, and not grossly enlarged but did not appreciate sulcus so unsure if felt full extent of prostate. Normal penis (uncircumcised), no swelling or erythema of scrotum Derm: No rashes, tanned skin Neuro: AOx3, no focal deficits Psych: Answers questions appropriately   Laboratory:  Recent Labs Lab 01/22/17 1415 01/23/17 0852  WBC 12.4* 9.1  HGB 11.9* 11.4*  HCT 34.4* 34.0*  PLT 293 301    Recent Labs Lab 01/22/17 1415 01/22/17 1735 01/23/17 0852  NA 122*  --  137  K 3.5  --  3.3*  CL 91*  --  103  CO2 23  --  24  BUN 8  --  <5*  CREATININE 0.80  --  0.82  CALCIUM 8.5*  --  8.5*  PROT  --  6.9  --   BILITOT  --  0.6  --   ALKPHOS  --  83  --   ALT  --  11*  --   AST  --  18  --   GLUCOSE 208*  --  147*    Imaging/Diagnostic Tests: No results found.  Casey Burkitt, MD 01/23/2017, 11:00 AM PGY-2, [facility] Family Medicine FPTS Intern pager: [phone], text pages welcome

## 2017-01-24 DIAGNOSIS — E43 Unspecified severe protein-calorie malnutrition: Secondary | ICD-10-CM | POA: Insufficient documentation

## 2017-01-24 LAB — CBC
HCT: 30.2 % — ABNORMAL LOW (ref 39.0–52.0)
Hemoglobin: 10.3 g/dL — ABNORMAL LOW (ref 13.0–17.0)
MCH: 32.6 pg (ref 26.0–34.0)
MCHC: 34.1 g/dL (ref 30.0–36.0)
MCV: 95.6 fL (ref 78.0–100.0)
PLATELETS: 285 10*3/uL (ref 150–400)
RBC: 3.16 MIL/uL — AB (ref 4.22–5.81)
RDW: 13.6 % (ref 11.5–15.5)
WBC: 7 10*3/uL (ref 4.0–10.5)

## 2017-01-24 LAB — BASIC METABOLIC PANEL
ANION GAP: 7 (ref 5–15)
BUN: 10 mg/dL (ref 6–20)
CALCIUM: 8.4 mg/dL — AB (ref 8.9–10.3)
CHLORIDE: 103 mmol/L (ref 101–111)
CO2: 27 mmol/L (ref 22–32)
CREATININE: 0.77 mg/dL (ref 0.61–1.24)
GFR calc non Af Amer: >60 mL/min (ref >60)
Glucose, Bld: 142 mg/dL — ABNORMAL HIGH (ref 65–99)
Potassium: 3.4 mmol/L — ABNORMAL LOW (ref 3.5–5.1)
SODIUM: 137 mmol/L (ref 135–145)

## 2017-01-24 LAB — URINE CULTURE

## 2017-01-24 LAB — GLUCOSE, CAPILLARY
GLUCOSE-CAPILLARY: 146 mg/dL — AB (ref 65–99)
GLUCOSE-CAPILLARY: 177 mg/dL — AB (ref 65–99)
Glucose-Capillary: 312 mg/dL — ABNORMAL HIGH (ref 65–99)
Glucose-Capillary: 146 mg/dL — ABNORMAL HIGH (ref 65–99)

## 2017-01-24 MED ORDER — PSYLLIUM 95 % PO PACK
1.0000 | PACK | Freq: Every day | ORAL | Status: DC
  Administered 2017-01-25: 1 via ORAL
  Filled 2017-01-24: qty 1

## 2017-01-24 MED ORDER — POTASSIUM CHLORIDE CRYS ER 20 MEQ PO TBCR
40.0000 meq | EXTENDED_RELEASE_TABLET | Freq: Once | ORAL | Status: AC
  Administered 2017-01-24: 40 meq via ORAL
  Filled 2017-01-24: qty 2

## 2017-01-24 NOTE — Progress Notes (Signed)
Family Medicine Teaching Service Daily Progress Note Intern Pager: [phone]  Patient name: Mark Graham Medical record number: [id number] Date of birth: 04/03/1956 Age: 61 y.o. Gender: male  Primary Care Provider: No PCP Per Patient Consultants: None Code Status: Full  Chief Complaint: urinary retention  Assessment and Plan: Mark Graham is a 61 y.o. male presenting with urinary retention. PMH is significant for HTN, tobacco abuse, T2DM with peripheral neuropathy, kidney stones, and housing instability.   Urinary retention: Over 1 L retained on bladder scan on admission. ED physician reported resistance in distal penis with catheter placement, concerning for possible stricture. Had elevated PSA last admission but in setting of retention, as well. No episodes prior to this year but says he was told he had a narrow urethra in 2015. Possible causes include BPH, urethral stricture, constipation, mass, and kidney stones.   - Coude catheter in place - Continue flomax and finasteride, as had some weak voiding prior to complete retention - Will need to follow with Urology but finances are limiting; will consult Social Work - Bowel regimen with metamucil, as has hemorrhoids and hard stools - Will consult urology today   UTI: Leukocytosis resolved. Urine culture with Ecoli susceptible to all except ampicillin. Of note was prescribed keflex at ED visit 01/05/17 after foley removal but never took.  - continue CTX for now.  Hyponatremia, resolved: 137 this a.m. 122 on admission, 124 when corrected for hyperglycemia. Alert and without neuro deficits. Suspect hypovolemic hyponatremia given quick resolution.   History of kidney stones: Denies flank or groin pain. Renal US with 2 large calculi at Right renal pelvis > 22mm in diameter with mild hydronephrosis.   Chest pains: None since admission. Chronic and intermittent. EKG unremarkable. ACS risk factors of age, smoking history, and HTN. Normal ECHO  during last admission.  - Trend troponins (<0.03 x 3) - would likely benefit from outpatient cardiology referral  HTN, controlled:  Has been on norvasc but reports not being able to afford any medications regularly.  - Restart amlodipine if BP elevated   T2DM: Hgb A1c 7.2 on 12/13/16. Glucose in urine but no anion gap. Has been prescribed glipizide in past. CBGs stable this AM.  - SSI, sensitive scale  Tobacco abuse: Patient expressed interest in smoking cessation. - nicotine patch - Could ask pharmacy to provide further education  History of EtOH abuse: Alcohol level < 5 with reported last drink about 3 weeks ago. CIWA scores 0 since admission - will discontinue CIWA protocol   Body cramps: May be 2/2 peripheral neuropathy. No longer on gabapentin (unclear why). Worse over last 6 months. Mildly anemic but normal MCV and elevated iron (198) 12/13/16. CK level noemal -  vitamin D level pending  Social Issues: - Consulted SW for homelessness issues and lack of insurance - Consulted CM for medication assistance help  FEN/GI: Carb modified diet, protonix  Prophylaxis: Lovenox  Disposition: Home pending transition to PO antibiotics and coordination of follow-up.   Subjective:  No concerns this morning. No abdominal pain but feels discomfort if he pushes on his lower abdomen.    Objective: Temp:  [98.2 F (36.8 C)-99.7 F (37.6 C)] 99.7 F (37.6 C) (04/01 0648) Pulse Rate:  [90-99] 90 (04/01 0648) Resp:  [18] 18 (04/01 0648) BP: (119-144)/(82-93) 132/82 (04/01 0648) SpO2:  [97 %-99 %] 97 % (04/01 0648) Weight:  [73.7 kg (162 lb 6.4 oz)] 73.7 kg (162 lb 6.4 oz) (03/31 2335) Physical Exam: General: NAD, pleasant gentleman  resting in bed Cardiovascular: RRR, S1, S2, no mrg Respiratory: CTAB, no increased WOB Gastrointestinal: +BS, mild suprapubic tenderness on exam MSK: No LE edema, moves all limbs spontaneously, normal tone and bulk Derm: No rashes, tanned skin Neuro:  AOx3, no focal deficits Psych: Answers questions appropriately   Laboratory:  Recent Labs Lab 01/22/17 1415 01/23/17 0852 01/24/17 0508  WBC 12.4* 9.1 7.0  HGB 11.9* 11.4* 10.3*  HCT 34.4* 34.0* 30.2*  PLT 293 301 285    Recent Labs Lab 01/22/17 1415 01/22/17 1735 01/23/17 0852 01/24/17 0508  NA 122*  --  137 137  K 3.5  --  3.3* 3.4*  CL 91*  --  103 103  CO2 23  --  24 27  BUN 8  --  <5* 10  CREATININE 0.80  --  0.82 0.77  CALCIUM 8.5*  --  8.5* 8.4*  PROT  --  6.9  --   --   BILITOT  --  0.6  --   --   ALKPHOS  --  83  --   --   ALT  --  11*  --   --   AST  --  18  --   --   GLUCOSE 208*  --  147* 142*    Imaging/Diagnostic Tests: US Renal  Result Date: 01/23/2017 CLINICAL DATA:  Urinary retention since Monday, history hypertension, diabetes mellitus, smoking EXAM: RENAL / URINARY TRACT ULTRASOUND COMPLETE COMPARISON:  08/12/2017 FINDINGS: Right Kidney: Length: 11.6 cm. Normal cortical thickness and echogenicity. No mass, hydronephrosis or shadowing calcification. Left Kidney: Length: 12.5 cm. Normal cortical thickness and echogenicity. Two large calculi at the RIGHT renal pelvis larger 22 mm diameter. Mild hydronephrosis. No renal mass lesion. Bladder: Contains a Foley catheter and minimal urine. IMPRESSION: Mild LEFT hydronephrosis. 2 large calculi at renal pelvis larger 22 mm diameter. Electronically Signed   By: Ulyses Southward M.D.   On: 01/23/2017 15:19    Palma Holter, MD 01/24/2017, 8:49 AM PGY-2, [place] [facility] FPTS Intern pager: [phone], text pages welcome

## 2017-01-24 NOTE — Progress Notes (Signed)
Documentation:  Discussed patient case with Dr. Vernie Ammons from [facility] urology especially regarding the fact that this is patient's second episode of urinary retention and his difficulty following urology as an outpatient due to financial difficulties. Dr. Vernie Ammons recommended getting patient [facility] Card which they accept and/or patient can be seen at their resident clinic. He said he will place information regarding the resident clinic in patient chart. He recommended keeping Foley in at discharge until urology follow up. Due to financial difficulties, he said patient would not benefit from Finasteride in the acute stage but definitely continue Flomax. Will also discuss with social work. Appreciate Dr. Margrett Rud assistance.   Palma Holter, MD PGY 2 Family Medicine

## 2017-01-25 DIAGNOSIS — E43 Unspecified severe protein-calorie malnutrition: Secondary | ICD-10-CM

## 2017-01-25 LAB — BASIC METABOLIC PANEL
Anion gap: 8 (ref 5–15)
BUN: 10 mg/dL (ref 6–20)
CHLORIDE: 103 mmol/L (ref 101–111)
CO2: 29 mmol/L (ref 22–32)
Calcium: 8.8 mg/dL — ABNORMAL LOW (ref 8.9–10.3)
Creatinine, Ser: 0.7 mg/dL (ref 0.61–1.24)
GFR calc Af Amer: >60 mL/min (ref >60)
GLUCOSE: 149 mg/dL — AB (ref 65–99)
POTASSIUM: 3.6 mmol/L (ref 3.5–5.1)
Sodium: 140 mmol/L (ref 135–145)

## 2017-01-25 LAB — CBC
HCT: 34.7 % — ABNORMAL LOW (ref 39.0–52.0)
Hemoglobin: 11.7 g/dL — ABNORMAL LOW (ref 13.0–17.0)
MCH: 32.1 pg (ref 26.0–34.0)
MCHC: 33.7 g/dL (ref 30.0–36.0)
MCV: 95.3 fL (ref 78.0–100.0)
PLATELETS: 344 10*3/uL (ref 150–400)
RBC: 3.64 MIL/uL — AB (ref 4.22–5.81)
RDW: 13.5 % (ref 11.5–15.5)
WBC: 7.3 10*3/uL (ref 4.0–10.5)

## 2017-01-25 LAB — GLUCOSE, CAPILLARY
Glucose-Capillary: 145 mg/dL — ABNORMAL HIGH (ref 65–99)
Glucose-Capillary: 179 mg/dL — ABNORMAL HIGH (ref 65–99)

## 2017-01-25 LAB — VITAMIN D 25 HYDROXY (VIT D DEFICIENCY, FRACTURES): Vit D, 25-Hydroxy: 7.5 ng/mL — ABNORMAL LOW (ref 30.0–100.0)

## 2017-01-25 MED ORDER — CEPHALEXIN 500 MG PO CAPS: 500.0000 mg | ORAL_CAPSULE | Freq: Two times a day (BID) | ORAL | 0 refills | Status: AC

## 2017-01-25 MED ORDER — HYDROCORTISONE 2.5 % RE CREA: TOPICAL_CREAM | Freq: Two times a day (BID) | RECTAL | 0 refills | Status: DC | PRN

## 2017-01-25 MED ORDER — FINASTERIDE 5 MG PO TABS: 5.0000 mg | ORAL_TABLET | Freq: Every day | ORAL | 0 refills | Status: DC

## 2017-01-25 MED ORDER — AMLODIPINE BESYLATE 5 MG PO TABS: 5.0000 mg | ORAL_TABLET | Freq: Every day | ORAL | 0 refills | Status: DC

## 2017-01-25 MED ORDER — TAMSULOSIN HCL 0.4 MG PO CAPS: 0.8000 mg | ORAL_CAPSULE | Freq: Every day | ORAL | 0 refills | Status: DC

## 2017-01-25 MED FILL — TAMSULOSIN HCL 0.4 MG CAP: 0.4 | 30 days supply | Qty: 60 | Fill #0

## 2017-01-25 MED FILL — HYDROCORTISONE 2.5% CREAM: 2.5 | 10 days supply | Qty: 30 | Fill #0

## 2017-01-25 MED FILL — ?AMLODIPINE BESYLATE 5 MG T: 5 | 30 days supply | Qty: 30 | Fill #0

## 2017-01-25 MED FILL — CEPHALEXIN 500 MG CAPSULE: 500 | 4 days supply | Qty: 8 | Fill #0

## 2017-01-25 MED FILL — FINASTERIDE 5 MG TABLET: 5 | 30 days supply | Qty: 30 | Fill #0

## 2017-01-25 NOTE — Care Management Note (Signed)
Case Management Note  Patient Details  Name: Mark Graham MRN: [id number] Date of Birth: 06-12-1956  Subjective/Objective:     CM following for progression and d/c planning.                Action/Plan: 01/25/2017 This CM received a call from June from transitional care at [facility] and wellness center re scheduling this pt. She stated that she would see this pt today. Call from resident this pm re this pt followup, this CM gave info re [facility] and Wellness Center CM June call to this CM and phone number to June provided for their followup.  Per June this pt has been seen at [facility] as a walkin last month.   Expected Discharge Date:  01/25/17               Expected Discharge Plan:  Home/Self Care  In-House Referral:  PCP / [facility]  Discharge planning Services  CM Consult, Follow-up appt scheduled, Medication Assistance  Post Acute Care Choice:  NA Choice offered to:  Patient  DME Arranged:  Other see comment DME Agency:  NA  HH Arranged:  NA HH Agency:  NA  Status of Service:  Completed, signed off  If discussed at Long Length of Stay Meetings, dates discussed:    Additional Comments:  Starlyn Skeans, RN 01/25/2017, 4:29 PM

## 2017-01-25 NOTE — Discharge Instructions (Signed)
Mark Graham, your admitted for having urinary retention and required a catheter to help remove urine. This was thought to be from a urethral stricture. He also had an infection which was treated with antibiotics we gave you. You must keep your catheter in place until you follow up with urology outpatient. We have arranged for you to follow-up at the community wellness Center here in town for hospital follow-up on 4/10 at 2 PM. It is pivotal that you make this appointment in order for [place] to help provide you the care that you need. I have also written prescriptions to help you urinate properly. These are called Flomax and finasteride (see below for information on these medications). You can pick this up at the [facility] (same place your following up with). Please continue taking Keflex 500 mg twice a day for 4 more days to complete and antibiotics.  Urethral Stricture Urethral stricture is narrowing of the tube (urethra) that carries urine from the bladder out of the body. The urethra can become narrow due to scar tissue from an injury or infection. This can make it difficult to pass urine. In women, the urethra opens above the vaginal opening. In men, the urethra opens at the tip of the penis, and the urethra is much longer than it is in women. Because of the length of the male urethra, urethral stricture is much more common in men. This condition is treated with surgery. What are the causes? Common causes of urethral stricture in men and women include:  Urinary tract infection (UTI).  Sexually transmitted infection (STI).  Use of a tube placed into the urethra to drain urine from the bladder (urinary catheter).  Urinary tract surgery. In men, common causes of urethral stricture include:  A severe injury to the pelvis.  Prostate surgery.  Injury to the penis. In many cases, the cause of urethral stricture may not be known. What increases the risk? Urethral stricture is more likely  to develop in:  Men, especially men who have had prostate surgery.  People who use urinary catheters.  People who have had urinary tract surgery. What are the signs or symptoms? The most common symptom of this condition is difficulty passing urine. This may cause decreased urine flow, dribbling, or spraying of urine. Other symptoms may include:  Frequent UTIs.  Blood in the urine.  Pain when urinating.  Swelling of the penis in men.  Inability to pass urine (urinary obstruction). How is this diagnosed? This condition may be diagnosed based on:  Your medical history.  A physical exam.  Urine tests to check for infection or bleeding.  X-rays.  Ultrasound.  Retrograde urethrogram. This is a type of test in which dye is injected into the urethra and then an X-ray is taken.  Urethroscopy. This is when a thin tube with a light and camera on the end (urethroscope) is used to look at the urethra. How is this treated? This condition is treated with surgery. The type of surgery that you have depends on the severity of your condition. You may have:  Urethral dilation. In this procedure, the narrow part of the urethra is stretched open (dilated) with dilating instruments or a small balloon.  Urethrotomy. In this procedure, a urethroscope is placed into the urethra, and the narrow part of the urethra is cut open with a surgical blade inserted through the urethroscope.  Open surgery. In this procedure, an incision is made in the urethra, the narrow part is removed, and  the urethra is reconstructed. Follow these instructions at home:   Take over-the-counter and prescription medicines only as told by your health care provider.  If you were prescribed an antibiotic medicine, take it as told by your health care provider. Do not stop taking the antibiotic even if you start to feel better.  Drink enough fluid to keep your urine clear or pale yellow.  Keep all follow-up visits as told  by your health care provider. This is important. Contact a health care provider if:  You have signs of a urinary tract infection, such as:  Frequent urination or passing small amounts of urine frequently.  Needing to urinate urgently.  Pain or burning with urination.  Urine that smells bad or unusual.  Cloudy urine.  Pain in the lower abdomen or back.  Trouble urinating.  Blood in the urine.  Vomiting or being less hungry than normal.  Diarrhea or abdominal pain.  Vaginal discharge, if you are male.  Your symptoms are getting worse instead of better. Get help right away if:  You cannot pass urine.  You have a fever.  You have swelling, bruising, or discoloration of your genital area. This includes the penis, scrotum, and inner thighs for men, and the outer genital organs (vulva) and inner thighs for women.  You develop swelling in your legs.  You have difficulty breathing. This information is not intended to replace advice given to you by your health care provider. Make sure you discuss any questions you have with your health care provider. Document Released: 11/08/2015 Document Revised: 03/19/2016 Document Reviewed: 09/29/2015 Elsevier Interactive Patient Education  2017 Elsevier Inc.  Finasteride (Propecia) tablets What is this medicine? FINASTERIDE (fi NAS teer ide) is used to treat male pattern baldness in men only. This medicine is not for use in women. This medicine may be used for other purposes; ask your health care provider or pharmacist if you have questions. COMMON BRAND NAME(S): Propecia What should I tell my health care provider before I take this medicine? They need to know if you have any of these conditions: -liver disease -an unusual or allergic reaction to finasteride, other medicines, foods, dyes, or preservatives -pregnant or trying to get pregnant -breast-feeding How should I use this medicine? Take this medicine by mouth with a glass of  water. Follow the directions on the prescription label. You can take this medicine with or without food. Take your doses at regular intervals. Do not take your medicine more often than directed. Talk to your pediatrician regarding the use of this medicine in children. Special care may be needed. Overdosage: If you think you have taken too much of this medicine contact a poison control center or emergency room at once. NOTE: This medicine is only for you. Do not share this medicine with others. What if I miss a dose? If you miss a dose, take it as soon as you can. If it is almost time for your next dose, take only that dose. Do not take double or extra doses. What may interact with this medicine? -saw palmetto or other dietary supplements This list may not describe all possible interactions. Give your health care provider a list of all the medicines, herbs, non-prescription drugs, or dietary supplements you use. Also tell them if you smoke, drink alcohol, or use illegal drugs. Some items may interact with your medicine. What should I watch for while using this medicine? It may take at least three months of daily use of this medicine before  you notice an improvement in you hair loss. You must continue to take this medicine to maintain the results. If you stop taking this medicine, the effect will be reversed within 12 months. Do not donate blood while you are taking this medicine. This will prevent giving this medicine to a pregnant male through a blood transfusion. Ask your doctor or health care professional when it is safe to donate blood after you stop taking this medicine. Women who are pregnant or may get pregnant must not handle broken or crushed finasteride tablets. The active ingredient could harm the unborn baby. If a pregnant woman comes into contact with broken or crushed tablets she should check with her doctor or health care professional. Exposure to whole tablets is not expected to cause  harm as long as they are not swallowed. This medicine can interfere with PSA laboratory tests for prostate cancer. If you are scheduled to have a lab test for prostate cancer, tell your doctor or health care professional that you are taking this medicine. What side effects may I notice from receiving this medicine? Side effects that you should report to your doctor or health care professional as soon as possible: -allergic reactions like skin rash, itching or hives, swelling of the face, lips, or tongue -changes in breast like lumps, pain or fluids leaking from the nipple -pain in the testicles Side effects that usually do not require medical attention (report to your doctor or health care professional if they continue or are bothersome): -sexual difficulties like decreased sexual desire or ability to get an erection -small amount of semen released during sex This list may not describe all possible side effects. Call your doctor for medical advice about side effects. You may report side effects to FDA at 1-800-FDA-1088. Where should I keep my medicine? Keep out of the reach of children. Store at room temperature between 15 and 30 degrees C (59 and 86 degrees F). Protect from moisture. Keep container tightly closed. Throw away any unused medicine after the expiration date. NOTE: This sheet is a summary. It may not cover all possible information. If you have questions about this medicine, talk to your doctor, pharmacist, or health care provider.  2018 Elsevier/Gold Standard (2015-05-30 17:13:51)  Tamsulosin capsules What is this medicine? TAMSULOSIN (tam SOO loe sin) is used to treat enlargement of the prostate gland in men, a condition called benign prostatic hyperplasia or BPH. It is not for use in women. It works by relaxing muscles in the prostate and bladder neck. This improves urine flow and reduces BPH symptoms. This medicine may be used for other purposes; ask your health care provider or  pharmacist if you have questions. COMMON BRAND NAME(S): Flomax What should I tell my health care provider before I take this medicine? They need to know if you have any of the following conditions: -advanced kidney disease -advanced liver disease -low blood pressure -prostate cancer -an unusual or allergic reaction to tamsulosin, sulfa drugs, other medicines, foods, dyes, or preservatives -pregnant or trying to get pregnant -breast-feeding How should I use this medicine? Take this medicine by mouth about 30 minutes after the same meal every day. Follow the directions on the prescription label. Swallow the capsules whole with a glass of water. Do not crush, chew, or open capsules. Do not take your medicine more often than directed. Do not stop taking your medicine unless your doctor tells you to. Talk to your pediatrician regarding the use of this medicine in children. Special care  may be needed. Overdosage: If you think you have taken too much of this medicine contact a poison control center or emergency room at once. NOTE: This medicine is only for you. Do not share this medicine with others. What if I miss a dose? If you miss a dose, take it as soon as you can. If it is almost time for your next dose, take only that dose. Do not take double or extra doses. If you stop taking your medicine for several days or more, ask your doctor or health care professional what dose you should start back on. What may interact with this medicine? -cimetidine -fluoxetine -ketoconazole -medicines for erectile disfunction like sildenafil, tadalafil, vardenafil -medicines for high blood pressure -other alpha-blockers like alfuzosin, doxazosin, phentolamine, phenoxybenzamine, prazosin, terazosin -warfarin This list may not describe all possible interactions. Give your health care provider a list of all the medicines, herbs, non-prescription drugs, or dietary supplements you use. Also tell them if you smoke,  drink alcohol, or use illegal drugs. Some items may interact with your medicine. What should I watch for while using this medicine? Visit your doctor or health care professional for regular check ups. You will need lab work done before you start this medicine and regularly while you are taking it. Check your blood pressure as directed. Ask your health care professional what your blood pressure should be, and when you should contact him or her. This medicine may make you feel dizzy or lightheaded. This is more likely to happen after the first dose, after an increase in dose, or during hot weather or exercise. Drinking alcohol and taking some medicines can make this worse. Do not drive, use machinery, or do anything that needs mental alertness until you know how this medicine affects you. Do not sit or stand up quickly. If you begin to feel dizzy, sit down until you feel better. These effects can decrease once your body adjusts to the medicine. Contact your doctor or health care professional right away if you have an erection that lasts longer than 4 hours or if it becomes painful. This may be a sign of a serious problem and must be treated right away to prevent permanent damage. If you are thinking of having cataract surgery, tell your eye surgeon that you have taken this medicine. What side effects may I notice from receiving this medicine? Side effects that you should report to your doctor or health care professional as soon as possible: -allergic reactions like skin rash or itching, hives, swelling of the lips, mouth, tongue, or throat -breathing problems -change in vision -feeling faint or lightheaded -irregular heartbeat -prolonged or painful erection -weakness Side effects that usually do not require medical attention (report to your doctor or health care professional if they continue or are bothersome): -back pain -change in sex drive or performance -constipation, nausea or  vomiting -cough -drowsy -runny or stuffy nose -trouble sleeping This list may not describe all possible side effects. Call your doctor for medical advice about side effects. You may report side effects to FDA at 1-800-FDA-1088. Where should I keep my medicine? Keep out of the reach of children. Store at room temperature between 15 and 30 degrees C (59 and 86 degrees F). Throw away any unused medicine after the expiration date. NOTE: This sheet is a summary. It may not cover all possible information. If you have questions about this medicine, talk to your doctor, pharmacist, or health care provider.  2018 Elsevier/Gold Standard (2012-10-12 14:11:34)

## 2017-01-25 NOTE — Progress Notes (Signed)
01/25/2017 12:35 PM  Patient concerned about discharge still today. Called Family Medicine. Patient still expected to discharge today; awaiting orders. Will continue to assess and monitor.   Draven Natter [employer], RN-BC, [employer] Phone [phone]

## 2017-01-25 NOTE — Progress Notes (Signed)
Mark Graham to be D/C'd Home per MD order.  Discussed prescriptions and follow up appointments with the patient. Prescriptions given to patient, medication list explained in detail. Pt verbalized understanding.  Allergies as of 01/25/2017   No Known Allergies     Medication List    STOP taking these medications   folic acid 1 MG tablet Commonly known as:  FOLVITE   glipiZIDE 5 MG tablet Commonly known as:  GLUCOTROL   ibuprofen 200 MG tablet Commonly known as:  ADVIL,MOTRIN     TAKE these medications   amLODipine 5 MG tablet Commonly known as:  NORVASC Take 1 tablet (5 mg total) by mouth daily.   cephALEXin 500 MG capsule Commonly known as:  KEFLEX Take 1 capsule (500 mg total) by mouth 2 (two) times daily.   finasteride 5 MG tablet Commonly known as:  PROSCAR Take 1 tablet (5 mg total) by mouth daily.   hydrocortisone 2.5 % rectal cream Commonly known as:  ANUSOL-HC Place rectally 2 (two) times daily as needed for hemorrhoids or itching.   QUEtiapine 25 MG tablet Commonly known as:  SEROQUEL Take 1 tablet (25 mg total) by mouth at bedtime.   tamsulosin 0.4 MG Caps capsule Commonly known as:  FLOMAX Take 2 capsules (0.8 mg total) by mouth daily. What changed:  how much to take       Vitals:   01/25/17 0602 01/25/17 0905  BP: (!) 169/96 (!) 164/91  Pulse: 85 90  Resp: 16 17  Temp: 97.9 F (36.6 C) 98 F (36.7 C)    Skin clean, dry and intact without evidence of skin break down, no evidence of skin tears noted. IV catheter discontinued intact. Site without signs and symptoms of complications. Dressing and pressure applied. Pt denies pain at this time. No complaints noted.  An After Visit Summary was printed and given to the patient. Patient escorted via WC, and D/C home via private auto.  Mariann Barter BSN, RN [facility] Phone [phone]

## 2017-01-25 NOTE — Progress Notes (Signed)
01/25/2017 3:50 PM  Family Medicine called this RN to assist patient in getting a bus pass. Informed SW Erie Noe. Patient still expected to discharge today.   IV has been removed. Foley has been changed over to a leg bag.   Shari Natt [employer], RN-BC, [employer] Phone [phone]

## 2017-01-25 NOTE — Discharge Summary (Signed)
Family Medicine Teaching [facility] Discharge Summary  Patient name: Mark Graham Medical record number: [id number] Date of birth: 28-Feb-1956 Age: 61 y.o. Gender: male Date of Admission: 01/22/2017  Date of Discharge: 01/25/2017 Admitting Physician: Nestor Ramp, MD  Primary Care Provider: No PCP Per Patient Consultants: None  Indication for Hospitalization: Urinary retention  Discharge Diagnoses/Problem List:  Urinary retention Uncomplicated acute cystitis T2DM with peripheral neuropathy Hypertension Tobacco use disorder H/o kidney stones Housing instability  Disposition: Home  Discharge Condition: Stable, imporved  Discharge Exam:  General: NAD, pleasant gentleman resting in bed Cardiovascular: RRR, S1, S2, no mrg Respiratory: CTAB, no increased WOB Gastrointestinal: +BS, no suprapubic tenderness on exam GU: Urinary catheter in place with clear yellow urine MSK: No LE edema, moves all limbs spontaneously, normal tone and bulk Derm: No rashes, tanned skin Neuro: AOx3, no focal deficits Psych: Answers questions appropriately   Brief Hospital Course:  Mark Graham is a 61 y.o. male presenting with urinary retention. PMH is significant for HTN, tobacco abuse, T2DM with peripheral neuropathy, kidney stones, and housing instability.   Pt p/w 1 L urinary retention on bladder scan. Foley cath attempt but resistance to distal urethra in ED concerning for stricture. Indwelling Coude catheter placed with clear yellow fluid. Pt was started on Flomax and Finasteride. Pt also with uncomplicated cystitis by UA and UCx with E. Coli. CTX was provided for 3 days and transitioned to PO Keflex on d/c for remaining 4 days. Urology recommended keeping cath in place and f/u outpt at next available appt. CM provided assistance with hospital f/u and medication affordability given pts social need.  Issues for Follow Up:  1. Pt to keep Coude in place until f/u with Urology. Rx for Finasteride and  Flomax provided on d/c. Pt to obtain at [facility] where he can receive financial assistance. 2. Pt to f/u at [facility] on 4/10. Bus pass given to prive assistance getting to appt. 3. Kelflex 500 mg BID given for 4 more days to complete 7 day therpy for UTI E. Coli. Sensitivities received. Received 3 days of CTX prior to d/c. 4. Pt has h/o Glipizide for DM with A1c 7.2 on 12/13/16. Consider restarting. 5. Pt would benefit from smoking cessation counseling. 6. Chronic and intermittent chest pain. EKG unremarkable on admission and neg troponin x3. No CP during hospitalization. Last echo on admission normal. Has several risk factors. Consider further cards evaluation if persists. 7. Restartamlodipine for BP.  Significant Procedures: Coude catheter placement (01/22/2017)  Significant Labs and Imaging:   Recent Labs Lab 01/23/17 0852 01/24/17 0508 01/25/17 0726  WBC 9.1 7.0 7.3  HGB 11.4* 10.3* 11.7*  HCT 34.0* 30.2* 34.7*  PLT 301 285 344    Recent Labs Lab 01/22/17 1415 01/22/17 1735 01/23/17 0852 01/24/17 0508 01/25/17 0726  NA 122*  --  137 137 140  K 3.5  --  3.3* 3.4* 3.6  CL 91*  --  103 103 103  CO2 23  --  GLUCOSE 208*  --  147* 142* 149*  BUN 8  --  <5* 10 10  CREATININE 0.80  --  0.82 0.77 0.70  CALCIUM 8.5*  --  8.5* 8.4* 8.8*  ALKPHOS  --  83  --   --   --   AST  --  18  --   --   --   ALT  --  11*  --   --   --  ALBUMIN  --  3.3*  --   --   --    Urinalysis    Component Value Date/Time   COLORURINE YELLOW 01/22/2017 1826   APPEARANCEUR HAZY (A) 01/22/2017 1826   LABSPEC 1.005 01/22/2017 1826   PHURINE 6.0 01/22/2017 1826   GLUCOSEU >=500 (A) 01/22/2017 1826   HGBUR SMALL (A) 01/22/2017 1826   BILIRUBINUR NEGATIVE 01/22/2017 1826   KETONESUR 5 (A) 01/22/2017 1826   PROTEINUR NEGATIVE 01/22/2017 1826   UROBILINOGEN 1.0 01/15/2015 1946   NITRITE POSITIVE (A) 01/22/2017 1826   LEUKOCYTESUR LARGE (A) 01/22/2017  1826   US Renal (01/23/2017) FINDINGS: Right Kidney: Length: 11.6 cm. Normal cortical thickness and echogenicity. No mass, hydronephrosis or shadowing calcification. Left Kidney: Length: 12.5 cm. Normal cortical thickness and echogenicity. Two large calculi at the RIGHT renal pelvis larger 22 mm diameter. Mild hydronephrosis. No renal mass lesion. Bladder: Contains a Foley catheter and minimal urine.  IMPRESSION: Mild LEFT hydronephrosis, 2 large calculi at renal pelvis larger 22 mm diameter.  Results/Tests Pending at Time of Discharge: None  Discharge Medications:  Allergies as of 01/25/2017   No Known Allergies     Medication List    STOP taking these medications   folic acid 1 MG tablet Commonly known as:  FOLVITE   glipiZIDE 5 MG tablet Commonly known as:  GLUCOTROL   ibuprofen 200 MG tablet Commonly known as:  ADVIL,MOTRIN     TAKE these medications   amLODipine 5 MG tablet Commonly known as:  NORVASC Take 1 tablet (5 mg total) by mouth daily.   cephALEXin 500 MG capsule Commonly known as:  KEFLEX Take 1 capsule (500 mg total) by mouth 2 (two) times daily.   finasteride 5 MG tablet Commonly known as:  PROSCAR Take 1 tablet (5 mg total) by mouth daily.   hydrocortisone 2.5 % rectal cream Commonly known as:  ANUSOL-HC Place rectally 2 (two) times daily as needed for hemorrhoids or itching.   QUEtiapine 25 MG tablet Commonly known as:  SEROQUEL Take 1 tablet (25 mg total) by mouth at bedtime.   tamsulosin 0.4 MG Caps capsule Commonly known as:  FLOMAX Take 2 capsules (0.8 mg total) by mouth daily. What changed:  how much to take       Discharge Instructions: Please refer to Patient Instructions section of EMR for full details.  Patient was counseled important signs and symptoms that should prompt return to medical care, changes in medications, dietary instructions, activity restrictions, and follow up appointments.   Follow-Up Appointments: Follow-up  Information    [facility]. Go on 02/02/2017.   Why:  at 2:00pm for an appointment with Dr Venetia Night in the [facility].  Contact information: [address] [place] [address] [phone]       Call [facility]   Why:  For an appointment in 1-2 weeks when you get home. Tell them you need to be seen in the resident clinic. Contact information: [address] [place] [address] [phone]           Wendee Beavers, DO 01/25/2017, 8:54 PM PGY-1, [facility] Family Medicine

## 2017-01-25 NOTE — Progress Notes (Signed)
Family Medicine Teaching Service Daily Progress Note Intern Pager: [phone]  Patient name: Mark Graham Medical record number: [id number] Date of birth: 12-15-1955 Age: 61 y.o. Gender: male  Primary Care Provider: No PCP Per Patient Consultants: None Code Status: Full  Chief Complaint: urinary retention  Assessment and Plan: TOM MACPHERSON is a 61 y.o. male presenting with urinary retention. PMH is significant for HTN, tobacco abuse, T2DM with peripheral neuropathy, kidney stones, and housing instability.   Urinary retention: Over 1 L retained on bladder scan on admission. ED physician reported resistance in distal penis with catheter placement, concerning for possible stricture. Had elevated PSA last admission but in setting of retention, as well. No episodes prior to this year but says he was told he had a narrow urethra in 2015. Possible causes include BPH, urethral stricture, constipation, mass, and kidney stones.   --Coude catheter in place --Continue flomax and finasteride, as had some weak voiding prior to complete retention --Urology to follow-up outpatient --Bowel regimen with metamucil, as has hemorrhoids and hard stools --Patient to see [facility] Center for hospital follow-up further care management  UTI: Leukocytosis resolved. Urine culture with Ecoli susceptible to all except ampicillin. Of note was prescribed keflex at ED visit 01/05/17 after foley removal but never took.  --Received a CTX for 3 doses, will continue by mouth Keflex on discharge for an additional 4 days  Hyponatremia, resolved: 137 this a.m. 122 on admission, 124 when corrected for hyperglycemia. Alert and without neuro deficits. Suspect hypovolemic hyponatremia given quick resolution.   History of kidney stones: Denies flank or groin pain. Renal US with 2 large calculi at Right renal pelvis > 22mm in diameter with mild hydronephrosis.   Chest pains: None since admission. Chronic and intermittent. EKG  unremarkable. ACS risk factors of age, smoking history, and HTN. Normal ECHO during last admission. Troponin 3 negative.  HTN, controlled:  Has been on norvasc but reports not being able to afford any medications regularly.  --Restart amlodipine if BP elevated   T2DM: Hgb A1c 7.2 on 12/13/16. Glucose in urine but no anion gap. Has been prescribed glipizide in past. CBGs stable this AM.  --SSI, sensitive scale  Tobacco abuse: Patient expressed interest in smoking cessation. --nicotine patch --Could ask pharmacy to provide further education  History of EtOH abuse: Alcohol level < 5 with reported last drink about 3 weeks ago. CIWA scores 0 since admission --will discontinue CIWA protocol   Body cramps: May be 2/2 peripheral neuropathy. No longer on gabapentin (unclear why). Worse over last 6 months. Mildly anemic but normal MCV and elevated iron (198) 12/13/16. CK level noemal --vitamin D level pending  Social Issues: --Consulted SW for homelessness issues and lack of insurance --Consulted CM for medication assistance help  FEN/GI: Carb modified diet, protonix  Prophylaxis: Lovenox SQ  Disposition: Home pending transition to PO antibiotics and coordination of follow-up. Anticipate discharge today.  Subjective:  No concerns this morning. Patient able to void with catheter. No other complaints.  Objective: Temp:  [97.9 F (36.6 C)-98.2 F (36.8 C)] 98 F (36.7 C) (04/02 0905) Pulse Rate:  [83-90] 90 (04/02 0905) Resp:  [16-17] 17 (04/02 0905) BP: (152-169)/(91-96) 164/91 (04/02 0905) SpO2:  [96 %-97 %] 97 % (04/02 0905) Weight:  [163 lb (73.9 kg)] 163 lb (73.9 kg) (04/02 0602) Physical Exam: General: NAD, pleasant gentleman resting in bed Cardiovascular: RRR, S1, S2, no mrg Respiratory: CTAB, no increased WOB Gastrointestinal: +BS, no suprapubic tenderness on exam GU: Urinary catheter  in place with clear yellow urine MSK: No LE edema, moves all limbs spontaneously,  normal tone and bulk Derm: No rashes, tanned skin Neuro: AOx3, no focal deficits Psych: Answers questions appropriately   Laboratory:  Recent Labs Lab 01/23/17 0852 01/24/17 0508 01/25/17 0726  WBC 9.1 7.0 7.3  HGB 11.4* 10.3* 11.7*  HCT 34.0* 30.2* 34.7*  PLT 301 285 344    Recent Labs Lab 01/22/17 1735 01/23/17 0852 01/24/17 0508 01/25/17 0726  NA  --  137 137 140  K  --  3.3* 3.4* 3.6  CL  --  103 103 103  CO2  --  BUN  --  <5* 10 10  CREATININE  --  0.82 0.77 0.70  CALCIUM  --  8.5* 8.4* 8.8*  PROT 6.9  --   --   --   BILITOT 0.6  --   --   --   ALKPHOS 83  --   --   --   ALT 11*  --   --   --   AST 18  --   --   --   GLUCOSE  --  147* 142* 149*    Imaging/Diagnostic Tests: US Renal  Result Date: 01/23/2017 CLINICAL DATA:  Urinary retention since Monday, history hypertension, diabetes mellitus, smoking EXAM: RENAL / URINARY TRACT ULTRASOUND COMPLETE COMPARISON:  08/12/2017 FINDINGS: Right Kidney: Length: 11.6 cm. Normal cortical thickness and echogenicity. No mass, hydronephrosis or shadowing calcification. Left Kidney: Length: 12.5 cm. Normal cortical thickness and echogenicity. Two large calculi at the RIGHT renal pelvis larger 22 mm diameter. Mild hydronephrosis. No renal mass lesion. Bladder: Contains a Foley catheter and minimal urine. IMPRESSION: Mild LEFT hydronephrosis. 2 large calculi at renal pelvis larger 22 mm diameter. Electronically Signed   By: Ulyses Southward M.D.   On: 01/23/2017 15:19    Wendee Beavers, DO 01/25/2017, 9:57 AM PGY-1, [facility] FPTS Intern pager: [phone], text pages welcome

## 2017-01-25 NOTE — Clinical Social Work Note (Signed)
CSW received consult to talk with patient regarding homeless issues and insurance concerns. CSW advised by patient that he lives in a home on [address] that is his brother's home, however it is infested with termites and rats and this was discussed. Patient currently is not working and does not have an income source, however he does receive food stamps. Applying for Medicaid and SSA disability was discussed and also his eventual eligibility for Social Security retirement benefits. Patient aware of the newly founded [employer] and information on this resource was provided to patient. Mark Graham was encouraged to apply for Medicaid as soon as he can.  Genelle Bal, MSW, LCSW Licensed Clinical Social Worker Clinical Social Work Department [employer] [phone]

## 2017-01-25 NOTE — Hospital Discharge Follow-Up (Signed)
Transitional Care Clinic Care Coordination Note:  Admit date:  01/22/2017 Discharge date: 01/25/2017 Discharge Disposition: home Patient contact: cell # [phone] Emergency contact(s): no other emergency #s provided.   This Case Manager reviewed patient's EMR and determined patient would benefit from post-discharge medical management and chronic care management services through the [facility]. Patient has a history of HTN, DM- type 2 with peripheral neuropathy, kidney stones,  He was admitted with urinary retention.  An indwelling catheter has ben placed and he has also been treated for UTI, hyponatremia. He has 2 hospital admissions and 2 ED visits in the past year.   . This Case Manager met with patient to discuss the services and medical management that can be provided at the [facility]. Patient verbalized understanding and agreed to receive post-discharge care at the [facility].   Patient scheduled for Transitional Care appointment on 02/02/17 @ 1400 .  Clinic information and appointment time provided to patient. Appointment information also placed on AVS.  Assessment:       Home Environment: He stated that he lives in his brother's home that is infested with termites and rats. He noted that his brother lives in the shed on the property, not in the house. He has discussed these concerns with Crawford Givens, LCSW who has provided him information about the  [facility].        Support System: He said that he really does not have any support other than his brothers.When this CM asked how many brothers he has, he said " maybe 4."        Level of functioning: independent. He stated that he has a limited education but can read.        Home DME: cane       Home care services: (services arranged prior to discharge or new services after discharge) none at this time       Transportation: He said that he has no transportation and walks wherever he  needs to go. He said that he walks to the clinic or the grocery store.  He said that he makes multiple trips to the grocery store to bring home groceries         Food/Nutrition: (ability to afford, access, use of any community resources): he receives $191/month in food stamps. This CM provided him with the booklets of resources for free meals and food pantries in [place].         Medications: (ability to afford, access, compliance, Pharmacy used) : uses [facility]. He is aware that he is able to put his charges on an account and can pay the balance off over time.         Identified Barriers: no PCP, no transportation, unhealthy housing conditions, lack of  support in the community from friends/family other than his brother, no income. ? Compliance with medical follow up and medication regime, foley catheter - risk for infection/complications.         PCP (Name, office location, phone number): No PCP  Patient Education: explained the services at [facility] including pharmacy assistance, social work, financial counseling. Explained to him about the importance meeting with the financial counselor to apply for the [employer]  and the [employer]. He stated that he is aware of the need to obtain the orange card in order to get a referral to urology.  Also discussed the option of applying for [employer] but there is a cost of $1.50 for the rides  and he has no income. He said that he needs to apply for medicaid and disability but he is not sure that he will qualify.             Arranged services:        Services communicated to [employer], LCSW. She stated that she would also provide information to the patient about the [facility] and provide him with a bus pass. She said that she has already provided him with information about the [employer] to possibly address the sanitation issues in the home   This morning, message left for [employer], RN CM that this CM would meet with the patient  today to discuss follow at the [facility],    Call received from Dr Yisroel Ramming inquiring about the status of the discharge plan as the patient is ready to be discharged today. Informed him that this CM will meet with the patient this afternoon and he requested a call back after meeting with the patient. Pager # [phone].  After meeting with the patient, this CM spoke to Dr Conception Oms, who was carrying the beeper for Dr Nila Nephew. Informed her that an appointment has been scheduled for 02/02/17 @ 1400 at the [facility]. The patient also can have his medications filled at [facility] but the [facility] may be closed by the time the patient is discharged and he will need to pick up his medications tomorrow.

## 2017-01-26 ENCOUNTER — Telehealth: Payer: Self-pay

## 2017-01-26 NOTE — Telephone Encounter (Signed)
Transitional Care Clinic Post-discharge Follow-Up Phone Call:  Date of Discharge:  01/25/2017 Principal Discharge Diagnosis(es):  Urinary retention, acute cystitis, HTN, DM -type 2 with peripheral neuropathy.  Post-discharge Communication: (Clearly document all attempts clearly and date contact made) call placed to the patient Call Completed: Yes                   With Whom: Patient Interpreter Needed: no     Please check all that apply:  X  Patient is knowledgeable of his/her condition(s) and/or treatment. X  Patient is caring for self at home.  ? Patient is receiving assist at home from family and/or caregiver. Family and/or caregiver is knowledgeable of patient's condition(s) and/or treatment. ? Patient is receiving home health services. If so, name of agency.     Medication Reconciliation:  ? Medication list reviewed with patient. X  Patient obtained all discharge medications. If not, why? He has not picked up any of his medications. This CM encouraged him to pick up his medications tomorrow and stressed the importance of taking them as prescribed.  He said that he would try to get to the [facility] pharmacy tomorrow. He said that he had a couple of pills left from prior to his hospitalization but he could not tell this CM what pills he had.  He would answer questions and then refer to Bible verses and would ask this CM to write them down to refer to later. He needed frequent re-direction back to the topic of his medications Reminded him that one of the medications is an antibiotic that is very important to take. This CM also explained some of his medications have been discontinued and again he spoke about the Bible verses but did say that he would try to pick up his medications tomorrow.    Activities of Daily Living:  X  Independent - he said that his foley is draining clear urine and again needed re-direction back to answering questions. He said that today he is feeling a " little better"  and has been sawing branches in the yard ? Needs assist (describe; ? home DME used) ? [facility] (describe, ? home DME used)   Community resources in place for patient:  X  None  ? Home Health/Home DME ? Assisted Living ? Support Group          Patient Education: Explained to him the importance of coming to his appointment next week. Also explained that he will need to meet with the financial counselor to apply for the [facility] which is important in order to obtain a referral to urology. He stated that he understood.         Questions/Concerns discussed:  reminded him of his appointment next week with Dr Venetia Night and informed him that a cab can be arranged to transport him to the clinic if needed.

## 2017-01-28 ENCOUNTER — Telehealth: Payer: Self-pay

## 2017-01-28 NOTE — Telephone Encounter (Signed)
Call received from the patient.  He said that he picked up all of his medications at [facility] and he has some questions, he is not sure that he is taking them correctly.   This CM reviewed his medication with him thoroughly and explained the use of each medication that he picked up  and how /when to take them and spelled the first 3-4 letters of each medication as he requested.    Explained that the antibiotic that is to be taken twice a day should be taken - 1 pill in the morning and 1 pill in the evening,not 2 pills in the morning as he had questioned.Marland Kitchen He stated that he understood and the information was very helpful. He said that he is not used to taking medication and didn't have any medications at home from prior to his hospitalization  He was not given a prescription for seroquel at discharge and he could not confirm that he had any at home.   This CM informed him that he would be receiving a message from [facility] with urology and he said that he received the message. Instructed him to call Dawn back and he said that he would.   He also confirmed his appointment at [facility] next week - 02/02/17 2 1400.    Message sent to Dr Venetia Night with update on medication noting the patient could not confirm that he has seroquel.

## 2017-01-28 NOTE — Telephone Encounter (Signed)
Call placed to [facility] #  [phone] to inquire if the patient will need an [facility] Card prior to being seen in the resident clinic. He is currently uninsured and does not have an [employer] yet. Voicemail message left for Marcelino Duster with call back requested to # [phone] or [phone].

## 2017-01-28 NOTE — Telephone Encounter (Signed)
Call received from [facility] with [facility]. She stated that they will see the patient in the resident clinic and he will not be charged for the visit but he will be charged for any testing that is done. She noted that if he is approved for the [employer], he would be reimbursed for the charges.  She scheduled the patient for an appointment for 02/11/17 @ 1400 in the Resident Clinic and then transferred the call to [facility] with billing/insurance.   This CM spoke to [facility] who stated that [facility] is not a [facility] provider and only work with the [employer] through [facility].  Explained that the patient can meet with the [facility] Financial Counselor to apply for the [employer],.  Dawn  said that she would call the patient to discuss the billing /[employer] Card process for their practice.   Message received from [facility], who stated that she left a message for the patient to return her call. # [phone].

## 2017-01-28 NOTE — Telephone Encounter (Signed)
This CM spoke to Venora Maples, [facility]  - [facility] who confirmed that the patient picked up his medications yesterday.  Attempted to contact the patient to inquire if he has any questions about his medications.  Call placed to # [phone] and a HIPAA compliant voicemail message left requesting a call back to # [phone] or [phone].

## 2017-02-01 ENCOUNTER — Ambulatory Visit: Payer: Self-pay | Attending: Internal Medicine

## 2017-02-01 ENCOUNTER — Telehealth: Payer: Self-pay

## 2017-02-01 NOTE — Telephone Encounter (Signed)
As per Diane Boyd, [facility] Financial Counsleor, the patient met with her to discuss applying for the Orange Card. She noted that he just needs to obtain a notarized letter from his brother who owns the house.  Diane also reminded him of his appointment at [facility] tomorrow @1400 and provided him with a bus pass to use tomorrow.  

## 2017-02-02 ENCOUNTER — Telehealth: Payer: Self-pay

## 2017-02-02 ENCOUNTER — Encounter: Payer: Self-pay | Admitting: Family Medicine

## 2017-02-02 ENCOUNTER — Ambulatory Visit: Payer: Self-pay | Attending: Family Medicine | Admitting: Family Medicine

## 2017-02-02 VITALS — BP 133/86 | HR 87 | Temp 97.7°F | Ht 71.0 in | Wt 171.0 lb

## 2017-02-02 DIAGNOSIS — F320 Major depressive disorder, single episode, mild: Secondary | ICD-10-CM

## 2017-02-02 DIAGNOSIS — Z79899 Other long term (current) drug therapy: Secondary | ICD-10-CM | POA: Insufficient documentation

## 2017-02-02 DIAGNOSIS — E119 Type 2 diabetes mellitus without complications: Secondary | ICD-10-CM | POA: Insufficient documentation

## 2017-02-02 DIAGNOSIS — K1379 Other lesions of oral mucosa: Secondary | ICD-10-CM

## 2017-02-02 DIAGNOSIS — F32A Depression, unspecified: Secondary | ICD-10-CM | POA: Insufficient documentation

## 2017-02-02 DIAGNOSIS — E870 Hyperosmolality and hypernatremia: Secondary | ICD-10-CM | POA: Insufficient documentation

## 2017-02-02 DIAGNOSIS — E0800 Diabetes mellitus due to underlying condition with hyperosmolarity without nonketotic hyperglycemic-hyperosmolar coma (NKHHC): Secondary | ICD-10-CM

## 2017-02-02 DIAGNOSIS — G4700 Insomnia, unspecified: Secondary | ICD-10-CM | POA: Insufficient documentation

## 2017-02-02 DIAGNOSIS — G4709 Other insomnia: Secondary | ICD-10-CM

## 2017-02-02 DIAGNOSIS — F329 Major depressive disorder, single episode, unspecified: Secondary | ICD-10-CM | POA: Insufficient documentation

## 2017-02-02 DIAGNOSIS — R339 Retention of urine, unspecified: Secondary | ICD-10-CM

## 2017-02-02 DIAGNOSIS — R51 Headache: Secondary | ICD-10-CM | POA: Insufficient documentation

## 2017-02-02 DIAGNOSIS — I10 Essential (primary) hypertension: Secondary | ICD-10-CM

## 2017-02-02 LAB — GLUCOSE, POCT (MANUAL RESULT ENTRY): POC GLUCOSE: 161 mg/dL — AB (ref 70–99)

## 2017-02-02 MED ORDER — GLUCOSE BLOOD VI STRP: ORAL_STRIP | 6 refills | Status: DC

## 2017-02-02 MED ORDER — TRAZODONE HCL 100 MG PO TABS: 100.0000 mg | ORAL_TABLET | Freq: Every evening | ORAL | 3 refills | Status: DC | PRN

## 2017-02-02 MED ORDER — FLUOXETINE HCL 20 MG PO TABS: 20.0000 mg | ORAL_TABLET | Freq: Every day | ORAL | 3 refills | Status: DC

## 2017-02-02 MED ORDER — LIDOCAINE VISCOUS 2 % MT SOLN: 10.0000 mL | OROMUCOSAL | 0 refills | Status: DC | PRN

## 2017-02-02 MED ORDER — TRUE METRIX METER DEVI: 1.0000 | Freq: Every day | 0 refills | Status: DC

## 2017-02-02 MED ORDER — TRUEPLUS LANCETS 28G MISC: 1.0000 | Freq: Every day | 12 refills | Status: DC

## 2017-02-02 MED ORDER — METFORMIN HCL 500 MG PO TABS: 500.0000 mg | ORAL_TABLET | Freq: Two times a day (BID) | ORAL | 3 refills | Status: DC

## 2017-02-02 MED FILL — ?TRAZODONE 100 MG TABLET: 100 MG | 30 days supply | Qty: 30 | Fill #0

## 2017-02-02 MED FILL — TRUEplus LANCETS 28G MISC: 25 days supply | Qty: 100 | Fill #0

## 2017-02-02 MED FILL — !TRUE METRIX BLOOD GLUCOSE: 365 days supply | Qty: 1 | Fill #0

## 2017-02-02 MED FILL — TRUE METRIX TEST STRIP: 30 days supply | Qty: 100 | Fill #0

## 2017-02-02 MED FILL — ?FLUOXETINE HCL 20MG TABLET: 20 | 30 days supply | Qty: 30 | Fill #0

## 2017-02-02 NOTE — Progress Notes (Signed)
Doesn't take "anything" for his diabetes

## 2017-02-02 NOTE — Telephone Encounter (Signed)
Met with the patient when he was in the clinic today for his TCC appointment. Reminded him that he needs to obtain a letter from his brother regarding his housing situation that will need to be notarized in order to complete the application for his Orange Card. Diane Boyd, [facility] Financial Counselor also met with him to explain to him the importance of obtaining this letter. Th notarization can be done at [facility] if he brother brings the letter to the clinic.  The patient stated that he understood and would try to have his brother come to the clinic.   This CM provided the patient with his Urology appointment date/time and address for the clinic.  He was also encouraged to return the call to Dawn ( billing)  with urology. He stated that he has her call back #.  No foley eg bags were available in the clinic.  He will continue to use the bedside drainage bag.  Dr Amao has ordered a glucometer for the patient. He said that he does not know how to use it but he is not interested in learning at this point. Explained to him that an appointment can be scheduled with the [facility] for glucometer instructions.  

## 2017-02-02 NOTE — Progress Notes (Addendum)
Transitional care clinic  Date of telephone encounter: 01/26/17  Hospitalization dates: 01/22/17 through 01/25/17 Subjective:  Patient ID: Mark Graham, male    DOB: 11/29/1955  Age: 61 y.o. MRN: [id number]  CC: Diabetes; Hypertension; Urinary Retention; feet cramping; and Headache   HPI Mark Graham is a 61 year old male with a history of hypertension, type 2 diabetes mellitus (A1c 7.2, not on medications), homelessness who presented to the ED with urinary retention and acute cystitis, found to have 1 L of urine on bladder scan.  He was catheterized and commenced on Flomax and finasteride. Also placed on ciprofloxacin which was transitioned to Keflex;  urine culture was positive for Escherichia coli. Seen by urology and recommendation was to keep catheter in place.  He presents today with his catheter in place and has an upcoming appointment with urology on 02/11/17; he is eager to have his catheter taken out. With regards to his diabetes mellitus he does not take any medication and does not check his blood sugars either. Complains of his poor living situation as his brother's apartment is infested with termites.  He does have insomnia which he attributes to constantly hearing noises outside his brother's apartment and trains constantly going by; he is requesting a medication for sleep and would also like to be placed on an antidepressant. Denies suicidal ideation. Complains of mouth pain and soreness whenever he chews which is not relieved by gargling with Listerine.  Past Medical History:  Diagnosis Date  . Diabetes mellitus without complication (HCC)   . Hypertension   . Tobacco abuse     History reviewed. No pertinent surgical history.  No Known Allergies   Outpatient Medications Prior to Visit  Medication Sig Dispense Refill  . amLODipine (NORVASC) 5 MG tablet Take 1 tablet (5 mg total) by mouth daily. 60 tablet 0  . finasteride (PROSCAR) 5 MG tablet Take 1 tablet (5 mg total)  by mouth daily. 120 tablet 0  . tamsulosin (FLOMAX) 0.4 MG CAPS capsule Take 2 capsules (0.8 mg total) by mouth daily. 120 capsule 0  . QUEtiapine (SEROQUEL) 25 MG tablet Take 1 tablet (25 mg total) by mouth at bedtime. 60 tablet 0  . hydrocortisone (ANUSOL-HC) 2.5 % rectal cream Place rectally 2 (two) times daily as needed for hemorrhoids or itching. (Patient not taking: Reported on 02/02/2017) 28.35 g 0   No facility-administered medications prior to visit.     ROS Review of Systems  Constitutional: Negative for activity change and appetite change.  HENT: Negative for sinus pressure and sore throat.   Eyes: Negative for visual disturbance.  Respiratory: Negative for cough, chest tightness and shortness of breath.   Cardiovascular: Negative for chest pain and leg swelling.  Gastrointestinal: Negative for abdominal distention, abdominal pain, constipation and diarrhea.  Endocrine: Negative.   Genitourinary: Negative for dysuria.  Musculoskeletal: Negative for joint swelling and myalgias.  Skin: Negative for rash.  Allergic/Immunologic: Negative.   Neurological: Negative for weakness, light-headedness and numbness.  Psychiatric/Behavioral: Positive for sleep disturbance. Negative for dysphoric mood and suicidal ideas.    Objective:  BP 133/86 (BP Location: Right Arm, Patient Position: Sitting, Cuff Size: Small)   Pulse 87   Temp 97.7 F (36.5 C) (Oral)   Ht  (1.803 m)   Wt 171 lb (77.6 kg)   SpO2 97%   BMI 23.85 kg/m   BP/Weight 02/02/2017 01/25/2017 01/05/2017  Systolic BP 133 164 147  Diastolic BP 86 91 89  Wt. (Lbs) 171 163  172  BMI 23.85 22.73 23.99      Physical Exam  Constitutional: He is oriented to person, place, and time. He appears well-developed and well-nourished.  HENT:  Edentulous  Cardiovascular: Normal rate, normal heart sounds and intact distal pulses.   No murmur heard. Pulmonary/Chest: Effort normal and breath sounds normal. He has no wheezes. He  has no rales. He exhibits no tenderness.  Abdominal: Soft. Bowel sounds are normal. He exhibits no distension and no mass. There is no tenderness.  Genitourinary:  Genitourinary Comments: Foley catheter in place  Musculoskeletal: Normal range of motion.  Neurological: He is alert and oriented to person, place, and time.  Skin: Skin is warm and dry.     Lab Results  Component Value Date   HGBA1C 7.2 (H) 12/13/2016    Assessment & Plan:   1. Diabetes mellitus due to underlying condition with hyperosmolarity without coma, without long-term current use of insulin (HCC) Controlled with A1c of 7.2 Commence metformin Diabetic diet - Glucose (CBG) - metFORMIN (GLUCOPHAGE) 500 MG tablet; Take 1 tablet (500 mg total) by mouth 2 (two) times daily with a meal.  Dispense: 60 tablet; Refill: 3 - glucose blood (TRUE METRIX BLOOD GLUCOSE TEST) test strip; Use daily before breakfast  Dispense: 30 each; Refill: 6 - TRUEPLUS LANCETS 28G MISC; 1 each by Does not apply route daily.  Dispense: 30 each; Refill: 12 - Blood Glucose Monitoring Suppl (TRUE METRIX METER) DEVI; 1 each by Does not apply route daily.  Dispense: 1 Device; Refill: 0  2. Hypertension, unspecified type Controlled Continue antihypertensives Low-sodium diet  3. Mouth pain Could be secondary to gingivitis - lidocaine (XYLOCAINE) 2 % solution; Use as directed 10 mLs in the mouth or throat as needed for mouth pain.  Dispense: 100 mL; Refill: 0  4. Mild single current episode of major depressive disorder (HCC) - FLUoxetine (PROZAC) 20 MG tablet; Take 1 tablet (20 mg total) by mouth daily.  Dispense: 30 tablet; Refill: 3  5. Other insomnia - traZODone (DESYREL) 100 MG tablet; Take 1 tablet (100 mg total) by mouth at bedtime as needed for sleep.  Dispense: 30 tablet; Refill: 3  6. Urinary retention Foley catheter in place Keep appointment with neurology on 02/11/17  Meds ordered this encounter  Medications  . traZODone (DESYREL)  100 MG tablet    Sig: Take 1 tablet (100 mg total) by mouth at bedtime as needed for sleep.    Dispense:  30 tablet    Refill:  3  . FLUoxetine (PROZAC) 20 MG tablet    Sig: Take 1 tablet (20 mg total) by mouth daily.    Dispense:  30 tablet    Refill:  3  . metFORMIN (GLUCOPHAGE) 500 MG tablet    Sig: Take 1 tablet (500 mg total) by mouth 2 (two) times daily with a meal.    Dispense:  60 tablet    Refill:  3  . lidocaine (XYLOCAINE) 2 % solution    Sig: Use as directed 10 mLs in the mouth or throat as needed for mouth pain.    Dispense:  100 mL    Refill:  0  . glucose blood (TRUE METRIX BLOOD GLUCOSE TEST) test strip    Sig: Use daily before breakfast    Dispense:  30 each    Refill:  6  . TRUEPLUS LANCETS 28G MISC    Sig: 1 each by Does not apply route daily.    Dispense:  30 each  Refill:  12  . Blood Glucose Monitoring Suppl (TRUE METRIX METER) DEVI    Sig: 1 each by Does not apply route daily.    Dispense:  1 Device    Refill:  0    Follow-up: Return in about 2 weeks (around 02/16/2017) for TCC - follow up on diabetes mellitus.   Jaclyn Shaggy MD

## 2017-02-02 NOTE — Patient Instructions (Signed)
Diabetes Mellitus and Food It is important for you to manage your blood sugar (glucose) level. Your blood glucose level can be greatly affected by what you eat. Eating healthier foods in the appropriate amounts throughout the day at about the same time each day will help you control your blood glucose level. It can also help slow or prevent worsening of your diabetes mellitus. Healthy eating may even help you improve the level of your blood pressure and reach or maintain a healthy weight. General recommendations for healthful eating and cooking habits include:  Eating meals and snacks regularly. Avoid going long periods of time without eating to lose weight.  Eating a diet that consists mainly of plant-based foods, such as fruits, vegetables, nuts, legumes, and whole grains.  Using low-heat cooking methods, such as baking, instead of high-heat cooking methods, such as deep frying.  Work with your dietitian to make sure you understand how to use the Nutrition Facts information on food labels. How can food affect me? Carbohydrates Carbohydrates affect your blood glucose level more than any other type of food. Your dietitian will help you determine how many carbohydrates to eat at each meal and teach you how to count carbohydrates. Counting carbohydrates is important to keep your blood glucose at a healthy level, especially if you are using insulin or taking certain medicines for diabetes mellitus. Alcohol Alcohol can cause sudden decreases in blood glucose (hypoglycemia), especially if you use insulin or take certain medicines for diabetes mellitus. Hypoglycemia can be a life-threatening condition. Symptoms of hypoglycemia (sleepiness, dizziness, and disorientation) are similar to symptoms of having too much alcohol. If your health care provider has given you approval to drink alcohol, do so in moderation and use the following guidelines:  Women should not have more than one drink per day, and men  should not have more than two drinks per day. One drink is equal to: ? 12 oz of beer. ? 5 oz of wine. ? 1 oz of hard liquor.  Do not drink on an empty stomach.  Keep yourself hydrated. Have water, diet soda, or unsweetened iced tea.  Regular soda, juice, and other mixers might contain a lot of carbohydrates and should be counted.  What foods are not recommended? As you make food choices, it is important to remember that all foods are not the same. Some foods have fewer nutrients per serving than other foods, even though they might have the same number of calories or carbohydrates. It is difficult to get your body what it needs when you eat foods with fewer nutrients. Examples of foods that you should avoid that are high in calories and carbohydrates but low in nutrients include:  Trans fats (most processed foods list trans fats on the Nutrition Facts label).  Regular soda.  Juice.  Candy.  Sweets, such as cake, pie, doughnuts, and cookies.  Fried foods.  What foods can I eat? Eat nutrient-rich foods, which will nourish your body and keep you healthy. The food you should eat also will depend on several factors, including:  The calories you need.  The medicines you take.  Your weight.  Your blood glucose level.  Your blood pressure level.  Your cholesterol level.  You should eat a variety of foods, including:  Protein. ? Lean cuts of meat. ? Proteins low in saturated fats, such as fish, egg whites, and beans. Avoid processed meats.  Fruits and vegetables. ? Fruits and vegetables that may help control blood glucose levels, such as apples,   mangoes, and yams.  Dairy products. ? Choose fat-free or low-fat dairy products, such as milk, yogurt, and cheese.  Grains, bread, pasta, and rice. ? Choose whole grain products, such as multigrain bread, whole oats, and brown rice. These foods may help control blood pressure.  Fats. ? Foods containing healthful fats, such as  nuts, avocado, olive oil, canola oil, and fish.  Does everyone with diabetes mellitus have the same meal plan? Because every person with diabetes mellitus is different, there is not one meal plan that works for everyone. It is very important that you meet with a dietitian who will help you create a meal plan that is just right for you. This information is not intended to replace advice given to you by your health care provider. Make sure you discuss any questions you have with your health care provider. Document Released: 07/09/2005 Document Revised: 03/19/2016 Document Reviewed: 09/08/2013 Elsevier Interactive Patient Education  2017 Elsevier Inc.  

## 2017-02-05 ENCOUNTER — Telehealth: Payer: Self-pay

## 2017-02-05 NOTE — Telephone Encounter (Signed)
Call placed to [facility] with [facility] who confirmed that the patient has not yet returned her call about billing for the urology services.  Call placed to the patient and stressed the  Importance of calling Dawn with [facility]. He said that he would call her after hanging up with this CM. He was able to state her call back # [phone]. This CM reminded him that she was at [phone]. The patient also said that he has tried to contact his brother in [place] to obtain the notarized letter of residency.    He said he has all of his medications and is taking them as ordered  He also said that he has a glucometer but will need instruction and he is agreeable to receiving instruction at a later time.  He stated that he is feeling fine and had no questions/concerns to report.

## 2017-02-08 ENCOUNTER — Telehealth: Payer: Self-pay

## 2017-02-08 NOTE — Telephone Encounter (Signed)
Call placed to the patient to inquire if he has spoken to [facility] at [facility]. He said that he did not speak to her but he received call and was told his appointment was changed to 02/25/17. He also reported that he received a notarized letter from his brother confirming his residency and he will try to bring it to the [facility] tomorrow. No problems/questions reported at this time.   Call placed to [facility] # [phone] and spoke to [facility] who confirmed that the appointment in the resident clinic was changed to 02/25/17 @ 1345. She was not sure why it needed to be changed.

## 2017-02-10 ENCOUNTER — Telehealth: Payer: Self-pay

## 2017-02-10 NOTE — Telephone Encounter (Signed)
Call placed to [facility] with [facility] # # [phone] to inquire if the patient has contacted her regarding billing for his upcoming visit/services. Call back # left  # [phone].

## 2017-02-15 ENCOUNTER — Telehealth: Payer: Self-pay

## 2017-02-15 NOTE — Telephone Encounter (Signed)
Attempted to contact the patient to inquire about his status and to remind him of his appointment tomorrow at the [facility] @ 1400. Call placed to # [phone] (M) and a HIPAA compliant voicemail message was left requesting a call back to # [phone].

## 2017-02-16 ENCOUNTER — Ambulatory Visit: Payer: Self-pay | Attending: Family Medicine | Admitting: Family Medicine

## 2017-02-16 ENCOUNTER — Encounter: Payer: Self-pay | Admitting: Family Medicine

## 2017-02-16 ENCOUNTER — Telehealth: Payer: Self-pay

## 2017-02-16 VITALS — BP 127/73 | HR 91 | Temp 97.6°F | Ht 71.0 in | Wt 164.0 lb

## 2017-02-16 DIAGNOSIS — I10 Essential (primary) hypertension: Secondary | ICD-10-CM | POA: Insufficient documentation

## 2017-02-16 DIAGNOSIS — E119 Type 2 diabetes mellitus without complications: Secondary | ICD-10-CM | POA: Insufficient documentation

## 2017-02-16 DIAGNOSIS — E0800 Diabetes mellitus due to underlying condition with hyperosmolarity without nonketotic hyperglycemic-hyperosmolar coma (NKHHC): Secondary | ICD-10-CM

## 2017-02-16 DIAGNOSIS — G4709 Other insomnia: Secondary | ICD-10-CM

## 2017-02-16 DIAGNOSIS — F320 Major depressive disorder, single episode, mild: Secondary | ICD-10-CM

## 2017-02-16 DIAGNOSIS — G4700 Insomnia, unspecified: Secondary | ICD-10-CM | POA: Insufficient documentation

## 2017-02-16 DIAGNOSIS — R339 Retention of urine, unspecified: Secondary | ICD-10-CM | POA: Insufficient documentation

## 2017-02-16 DIAGNOSIS — N3001 Acute cystitis with hematuria: Secondary | ICD-10-CM | POA: Insufficient documentation

## 2017-02-16 DIAGNOSIS — Z590 Homelessness: Secondary | ICD-10-CM | POA: Insufficient documentation

## 2017-02-16 DIAGNOSIS — F329 Major depressive disorder, single episode, unspecified: Secondary | ICD-10-CM | POA: Insufficient documentation

## 2017-02-16 DIAGNOSIS — Z720 Tobacco use: Secondary | ICD-10-CM | POA: Insufficient documentation

## 2017-02-16 DIAGNOSIS — E870 Hyperosmolality and hypernatremia: Secondary | ICD-10-CM | POA: Insufficient documentation

## 2017-02-16 LAB — GLUCOSE, POCT (MANUAL RESULT ENTRY): POC Glucose: 302 mg/dl — AB (ref 70–99)

## 2017-02-16 MED FILL — ?METFORMIN HCL 500MG TABLET: 500 | 30 days supply | Qty: 60 | Fill #0

## 2017-02-16 NOTE — Telephone Encounter (Signed)
Attempted to contact the patient to confirm his appointment for today and to inquire if he has transportation to the clinic.  Call placed to # [phone] (M) and a HIPAA compliant voicemail message was left requesting a call back to # [phone]

## 2017-02-16 NOTE — Patient Instructions (Signed)
Diabetes Mellitus and Food It is important for you to manage your blood sugar (glucose) level. Your blood glucose level can be greatly affected by what you eat. Eating healthier foods in the appropriate amounts throughout the day at about the same time each day will help you control your blood glucose level. It can also help slow or prevent worsening of your diabetes mellitus. Healthy eating may even help you improve the level of your blood pressure and reach or maintain a healthy weight. General recommendations for healthful eating and cooking habits include:  Eating meals and snacks regularly. Avoid going long periods of time without eating to lose weight.  Eating a diet that consists mainly of plant-based foods, such as fruits, vegetables, nuts, legumes, and whole grains.  Using low-heat cooking methods, such as baking, instead of high-heat cooking methods, such as deep frying.  Work with your dietitian to make sure you understand how to use the Nutrition Facts information on food labels. How can food affect me? Carbohydrates Carbohydrates affect your blood glucose level more than any other type of food. Your dietitian will help you determine how many carbohydrates to eat at each meal and teach you how to count carbohydrates. Counting carbohydrates is important to keep your blood glucose at a healthy level, especially if you are using insulin or taking certain medicines for diabetes mellitus. Alcohol Alcohol can cause sudden decreases in blood glucose (hypoglycemia), especially if you use insulin or take certain medicines for diabetes mellitus. Hypoglycemia can be a life-threatening condition. Symptoms of hypoglycemia (sleepiness, dizziness, and disorientation) are similar to symptoms of having too much alcohol. If your health care provider has given you approval to drink alcohol, do so in moderation and use the following guidelines:  Women should not have more than one drink per day, and men  should not have more than two drinks per day. One drink is equal to: ? 12 oz of beer. ? 5 oz of wine. ? 1 oz of hard liquor.  Do not drink on an empty stomach.  Keep yourself hydrated. Have water, diet soda, or unsweetened iced tea.  Regular soda, juice, and other mixers might contain a lot of carbohydrates and should be counted.  What foods are not recommended? As you make food choices, it is important to remember that all foods are not the same. Some foods have fewer nutrients per serving than other foods, even though they might have the same number of calories or carbohydrates. It is difficult to get your body what it needs when you eat foods with fewer nutrients. Examples of foods that you should avoid that are high in calories and carbohydrates but low in nutrients include:  Trans fats (most processed foods list trans fats on the Nutrition Facts label).  Regular soda.  Juice.  Candy.  Sweets, such as cake, pie, doughnuts, and cookies.  Fried foods.  What foods can I eat? Eat nutrient-rich foods, which will nourish your body and keep you healthy. The food you should eat also will depend on several factors, including:  The calories you need.  The medicines you take.  Your weight.  Your blood glucose level.  Your blood pressure level.  Your cholesterol level.  You should eat a variety of foods, including:  Protein. ? Lean cuts of meat. ? Proteins low in saturated fats, such as fish, egg whites, and beans. Avoid processed meats.  Fruits and vegetables. ? Fruits and vegetables that may help control blood glucose levels, such as apples,   mangoes, and yams.  Dairy products. ? Choose fat-free or low-fat dairy products, such as milk, yogurt, and cheese.  Grains, bread, pasta, and rice. ? Choose whole grain products, such as multigrain bread, whole oats, and brown rice. These foods may help control blood pressure.  Fats. ? Foods containing healthful fats, such as  nuts, avocado, olive oil, canola oil, and fish.  Does everyone with diabetes mellitus have the same meal plan? Because every person with diabetes mellitus is different, there is not one meal plan that works for everyone. It is very important that you meet with a dietitian who will help you create a meal plan that is just right for you. This information is not intended to replace advice given to you by your health care provider. Make sure you discuss any questions you have with your health care provider. Document Released: 07/09/2005 Document Revised: 03/19/2016 Document Reviewed: 09/08/2013 Elsevier Interactive Patient Education  2017 Elsevier Inc.  

## 2017-02-16 NOTE — Progress Notes (Signed)
Transitional care clinic  Date of telephone encounter: 01/26/17  Hospitalization dates: 01/22/17 through 01/25/17   Subjective:    Patient ID: Mark Graham, male    DOB: 03-07-1956, 61 y.o.   MRN: [id number]  HPI 61 year old male with a history of hypertension, type 2 diabetes mellitus (A1c 7.2) homelessness who was recently hospitalized for urinary retention and acute cystitis, found to have 1 L of urine on bladder scan.  He was catheterized and commenced on Flomax and finasteride. Also placed on ciprofloxacin which was transitioned to Keflex;  urine culture was positive for Escherichia coli. Seen by urology and recommendation was to keep catheter in place.  He presents today with his catheter in place ; his appointment with urology was rescheduled until first week in May. He is anxious to have his catheter taken out; he endorses 2 episodes of hematuria which have resolved  With regards to his diabetes mellitus he is unsure if he has been taking metformin and does not check his blood sugars either. Complains of his poor living situation as his brother's apartment is infested with termites.  He does have insomnia which he attributes to constantly hearing noises outside his brother's apartment and trains constantly going by; he was placed on trazodone and Prozac but does not have his medications with him and so component cannot be ascertained. Denies suicidal ideation.  Past Medical History:  Diagnosis Date  . Diabetes mellitus without complication (HCC)   . Hypertension   . Tobacco abuse     History reviewed. No pertinent surgical history.  No Known Allergies    Review of Systems Constitutional: Negative for activity change and appetite change.  HENT: Negative for sinus pressure and sore throat.   Eyes: Negative for visual disturbance.  Respiratory: Negative for cough, chest tightness and shortness of breath.   Cardiovascular: Negative for chest pain and leg swelling.    Gastrointestinal: Negative for abdominal distention, abdominal pain, constipation and diarrhea.  Endocrine: Negative.   Genitourinary: Negative for dysuria.  Musculoskeletal: Negative for joint swelling and myalgias.  Skin: Negative for rash.  Allergic/Immunologic: Negative.   Neurological: Negative for weakness, light-headedness and numbness.  Psychiatric/Behavioral: Positive for sleep disturbance. Negative for dysphoric mood and suicidal ideas.     Objective: Vitals:   02/16/17 1400  BP: 127/73  Pulse: 91  Temp: 97.6 F (36.4 C)  TempSrc: Oral  SpO2: 95%  Weight: 164 lb (74.4 kg)  Height:  (1.803 m)      Physical Exam Constitutional: He is oriented to person, place, and time. He appears well-developed and well-nourished.  HENT:  Edentulous  Cardiovascular: Normal rate, normal heart sounds and intact distal pulses.   No murmur heard. Pulmonary/Chest: Effort normal and breath sounds normal. He has no wheezes. He has no rales. He exhibits no tenderness.  Abdominal: Soft. Bowel sounds are normal. He exhibits no distension and no mass. There is no tenderness.  Genitourinary:  Genitourinary Comments: Foley catheter in place  Musculoskeletal: Normal range of motion.  Neurological: He is alert and oriented to person, place, and time.  Skin: Skin is warm and dry.          Assessment & Plan:  1. Diabetes mellitus due to underlying condition with hyperosmolarity without coma, without long-term current use of insulin (HCC) A1c of 7.2 Blood sugar is 302 ; probably not taking metformin which I have advised him to pick up from the pharmacy today  Diabetic diet - Glucose (CBG) - metFORMIN (GLUCOPHAGE) 500  MG tablet; Take 1 tablet (500 mg total) by mouth 2 (two) times daily with a meal.  Dispense: 60 tablet; Refill: 3 - glucose blood (TRUE METRIX BLOOD GLUCOSE TEST) test strip; Use daily before breakfast  Dispense: 30 each; Refill: 6 - TRUEPLUS LANCETS 28G MISC; 1 each by  Does not apply route daily.  Dispense: 30 each; Refill: 12 - Blood Glucose Monitoring Suppl (TRUE METRIX METER) DEVI; 1 each by Does not apply route daily.  Dispense: 1 Device; Refill: 0  2. Hypertension, unspecified type Controlled Continue antihypertensives Low-sodium diet   3. Mild single current episode of major depressive disorder (HCC) - FLUoxetine (PROZAC) 20 MG tablet; Take 1 tablet (20 mg total) by mouth daily.  Dispense: 30 tablet; Refill: 3  4. Other insomnia - traZODone (DESYREL) 100 MG tablet; Take 1 tablet (100 mg total) by mouth at bedtime as needed for sleep.  Dispense: 30 tablet; Refill: 3  5. Urinary retention Foley catheter in place Keep appointment with Urology

## 2017-02-17 LAB — CMP14+EGFR
ALT: 12 IU/L (ref 0–44)
AST: 14 IU/L (ref 0–40)
Albumin/Globulin Ratio: 1.4 (ref 1.2–2.2)
Albumin: 3.9 g/dL (ref 3.6–4.8)
Alkaline Phosphatase: 104 IU/L (ref 39–117)
BUN/Creatinine Ratio: 9 — ABNORMAL LOW (ref 10–24)
BUN: 8 mg/dL (ref 8–27)
Bilirubin Total: <0.2 mg/dL (ref 0.0–1.2)
CALCIUM: 9.4 mg/dL (ref 8.6–10.2)
CO2: 24 mmol/L (ref 18–29)
CREATININE: 0.87 mg/dL (ref 0.76–1.27)
Chloride: 98 mmol/L (ref 96–106)
GFR, EST AFRICAN AMERICAN: 108 mL/min/{1.73_m2} (ref >59)
GFR, EST NON AFRICAN AMERICAN: 94 mL/min/{1.73_m2} (ref >59)
GLUCOSE: 255 mg/dL — AB (ref 65–99)
Globulin, Total: 2.7 g/dL (ref 1.5–4.5)
POTASSIUM: 4.3 mmol/L (ref 3.5–5.2)
Sodium: 137 mmol/L (ref 134–144)
TOTAL PROTEIN: 6.6 g/dL (ref 6.0–8.5)

## 2017-02-22 ENCOUNTER — Telehealth: Payer: Self-pay | Admitting: *Deleted

## 2017-02-22 NOTE — Telephone Encounter (Signed)
Left message on voicemail to return call.

## 2017-02-23 ENCOUNTER — Telehealth: Payer: Self-pay

## 2017-02-23 NOTE — Telephone Encounter (Signed)
-----   Message from Jaclyn Shaggy, MD sent at 02/17/2017  1:37 PM EDT ----- Blood work revealed elevated glucose. Advised to comply with metformin.

## 2017-02-23 NOTE — Telephone Encounter (Signed)
Writer called and LVM for patient regarding his lab results.  Patient encouraged to call back with questions.

## 2017-02-23 NOTE — Telephone Encounter (Signed)
Call received from the patient requesting bus pass for his urology appointment on  02/25/17.  Informed him that he can pick up the bus pass at the [facility] tomorrow.   He stated that he now has an orange card and also informed him that he can  fill out an application for bus passes through [facility] when he is at the clinic.

## 2017-02-26 ENCOUNTER — Telehealth: Payer: Self-pay

## 2017-02-26 NOTE — Telephone Encounter (Signed)
Call placed to the patient to check on his status and discuss the outcome of his urology appointment.  Call placed to # [phone] (M) and a HIPAA compliant voicemail message was left requesting a call back to # [phone].

## 2017-03-03 MED FILL — ?AMLODIPINE BESYLATE 5 MG T: 5 | 30 days supply | Qty: 30 | Fill #1

## 2017-03-03 MED FILL — TAMSULOSIN HCL 0.4 MG CAP: 0.4 | 30 days supply | Qty: 60 | Fill #1

## 2017-03-03 MED FILL — ?FINASTERIDE 5 MG TABLET: 5 | 30 days supply | Qty: 30 | Fill #1

## 2017-03-16 ENCOUNTER — Telehealth: Payer: Self-pay

## 2017-03-16 NOTE — Telephone Encounter (Signed)
Call placed to the patient to check on him and to remind him of his appointment at [facility] on 03/19/16@ 1345.  He said that he would be at this appointment.  He also said that [facility].  Provided him with the number to call for the bus passes # [phone] and he said that he wrote it down.   He said the urologist took the foley out and he did not mention having another urology appointment. He was frequently off track and started to talk about Bible passages and needed frequent re-direction in the conversation.  No other problems/questions reported.

## 2017-03-19 ENCOUNTER — Encounter: Payer: Self-pay | Admitting: Family Medicine

## 2017-03-19 ENCOUNTER — Ambulatory Visit: Payer: Self-pay | Attending: Family Medicine | Admitting: Family Medicine

## 2017-03-19 VITALS — BP 129/79 | HR 93 | Temp 97.3°F | Resp 18 | Ht 71.0 in | Wt 162.0 lb

## 2017-03-19 DIAGNOSIS — Z7984 Long term (current) use of oral hypoglycemic drugs: Secondary | ICD-10-CM | POA: Insufficient documentation

## 2017-03-19 DIAGNOSIS — E870 Hyperosmolality and hypernatremia: Secondary | ICD-10-CM | POA: Insufficient documentation

## 2017-03-19 DIAGNOSIS — N3000 Acute cystitis without hematuria: Secondary | ICD-10-CM | POA: Insufficient documentation

## 2017-03-19 DIAGNOSIS — R339 Retention of urine, unspecified: Secondary | ICD-10-CM | POA: Insufficient documentation

## 2017-03-19 DIAGNOSIS — G4709 Other insomnia: Secondary | ICD-10-CM

## 2017-03-19 DIAGNOSIS — E0800 Diabetes mellitus due to underlying condition with hyperosmolarity without nonketotic hyperglycemic-hyperosmolar coma (NKHHC): Secondary | ICD-10-CM

## 2017-03-19 DIAGNOSIS — I10 Essential (primary) hypertension: Secondary | ICD-10-CM | POA: Insufficient documentation

## 2017-03-19 DIAGNOSIS — F320 Major depressive disorder, single episode, mild: Secondary | ICD-10-CM

## 2017-03-19 DIAGNOSIS — E089 Diabetes mellitus due to underlying condition without complications: Secondary | ICD-10-CM | POA: Insufficient documentation

## 2017-03-19 DIAGNOSIS — Z590 Homelessness: Secondary | ICD-10-CM | POA: Insufficient documentation

## 2017-03-19 DIAGNOSIS — G4700 Insomnia, unspecified: Secondary | ICD-10-CM | POA: Insufficient documentation

## 2017-03-19 DIAGNOSIS — F329 Major depressive disorder, single episode, unspecified: Secondary | ICD-10-CM | POA: Insufficient documentation

## 2017-03-19 LAB — GLUCOSE, POCT (MANUAL RESULT ENTRY): POC Glucose: 98 mg/dl (ref 70–99)

## 2017-03-19 MED ORDER — ATORVASTATIN CALCIUM 20 MG PO TABS: 20.0000 mg | ORAL_TABLET | Freq: Every day | ORAL | 3 refills | Status: DC

## 2017-03-19 NOTE — Progress Notes (Addendum)
Subjective:    Patient ID: Mark Graham, male    DOB: May 28, 1956, 61 y.o.   MRN: [id number]  HPI 61 year old male with a history of hypertension, type 2 diabetes mellitus (A1c 7.2) homelessness hospitalized last month for urinary retention and acute cystitis.He was catheterized and commenced on Flomax and finasteride and treated with antibiotics for Escherichia coli cystitis.. Foley catheter was recently discontinued by urology and he denies urinary retention or abdominal pain but missed his follow-up appointment.  He has been compliant with his metformin and denies hypoglycemia however he has not been checking his sugars.  He does have insomnia which he attributes to constantly hearing noises outside his brother's apartment and trains constantly going by; he was placed on trazodone for insomnia and Prozac for depression and reports minimal improvement in insomnia as the noises from his neighborhood are overwhelming.   Denies suicidal ideation.   Past Medical History:  Diagnosis Date  . Diabetes mellitus without complication (HCC)   . Hypertension   . Tobacco abuse     No past surgical history on file.  No Known Allergies  Current Outpatient Prescriptions on File Prior to Visit  Medication Sig Dispense Refill  . amLODipine (NORVASC) 5 MG tablet Take 1 tablet (5 mg total) by mouth daily. 60 tablet 0  . Blood Glucose Monitoring Suppl (TRUE METRIX METER) DEVI 1 each by Does not apply route daily. 1 Device 0  . finasteride (PROSCAR) 5 MG tablet Take 1 tablet (5 mg total) by mouth daily. 120 tablet 0  . glucose blood (TRUE METRIX BLOOD GLUCOSE TEST) test strip Use daily before breakfast 30 each 6  . hydrocortisone (ANUSOL-HC) 2.5 % rectal cream Place rectally 2 (two) times daily as needed for hemorrhoids or itching. 28.35 g 0  . metFORMIN (GLUCOPHAGE) 500 MG tablet Take 1 tablet (500 mg total) by mouth 2 (two) times daily with a meal. 60 tablet 3  . tamsulosin (FLOMAX) 0.4 MG CAPS  capsule Take 2 capsules (0.8 mg total) by mouth daily. 120 capsule 0  . TRUEPLUS LANCETS 28G MISC 1 each by Does not apply route daily. 30 each 12  . FLUoxetine (PROZAC) 20 MG tablet Take 1 tablet (20 mg total) by mouth daily. (Patient not taking: Reported on 02/16/2017) 30 tablet 3  . lidocaine (XYLOCAINE) 2 % solution Use as directed 10 mLs in the mouth or throat as needed for mouth pain. (Patient not taking: Reported on 02/16/2017) 100 mL 0  . traZODone (DESYREL) 100 MG tablet Take 1 tablet (100 mg total) by mouth at bedtime as needed for sleep. (Patient not taking: Reported on 02/16/2017) 30 tablet 3   No current facility-administered medications on file prior to visit.     Review of Systems  Constitutional: Negative for activity change and appetite change.  HENT: Negative for sinus pressure and sore throat.   Eyes: Negative for visual disturbance.  Respiratory: Negative for cough, chest tightness and shortness of breath.   Cardiovascular: Negative for chest pain and leg swelling.  Gastrointestinal: Negative for abdominal distention, abdominal pain, constipation and diarrhea.  Endocrine: Negative.   Genitourinary: Negative for dysuria.  Musculoskeletal: Negative for joint swelling and myalgias.  Skin: Negative for rash.  Allergic/Immunologic: Negative.   Neurological: Negative for weakness, light-headedness and numbness.  Psychiatric/Behavioral: Negative for dysphoric mood and suicidal ideas.       Objective: Vitals:   03/19/17 1405  BP: 129/79  Pulse: 93  Resp: 18  Temp: 97.3 F (36.3 C)  TempSrc: Oral  SpO2: 97%  Weight: 162 lb (73.5 kg)  Height: 5\' 11"  (1.803 m)      Physical Exam  Constitutional: He is oriented to person, place, and time. He appears well-developed and well-nourished.  Cardiovascular: Normal rate, normal heart sounds and intact distal pulses.   No murmur heard. Pulmonary/Chest: Effort normal and breath sounds normal. He has no wheezes. He has no rales.  He exhibits no tenderness.  Abdominal: Soft. Bowel sounds are normal. He exhibits no distension and no mass. There is no tenderness.  Musculoskeletal: Normal range of motion.  Neurological: He is alert and oriented to person, place, and time.  Skin: Skin is warm and dry.  Psychiatric: He has a normal mood and affect.          Assessment & Plan:  1. Diabetes mellitus due to underlying condition with hyperosmolarity without coma, without long-term current use of insulin (HCC) A1c of 7.2 Scheduled for education on diabetic diet with the clinical pharmacist. Diabetic diet Commenced on statin due to added CV benefit - Glucose (CBG) - metFORMIN (GLUCOPHAGE) 500 MG tablet; Take 1 tablet (500 mg total) by mouth 2 (two) times daily with a meal.  Dispense: 60 tablet; Refill: 3 - glucose blood (TRUE METRIX BLOOD GLUCOSE TEST) test strip; Use daily before breakfast  Dispense: 30 each; Refill: 6 - TRUEPLUS LANCETS 28G MISC; 1 each by Does not apply route daily.  Dispense: 30 each; Refill: 12 - Blood Glucose Monitoring Suppl (TRUE METRIX METER) DEVI; 1 each by Does not apply route daily.  Dispense: 1 Device; Refill: 0  2. Hypertension, unspecified type Controlled Continue antihypertensives Low-sodium diet   3. Mild single current episode of major depressive disorder (HCC) - FLUoxetine (PROZAC) 20 MG tablet; Take 1 tablet (20 mg total) by mouth daily.  Dispense: 30 tablet; Refill: 3  4. Other insomnia Improved on trazodone - traZODone (DESYREL) 100 MG tablet; Take 1 tablet (100 mg total) by mouth at bedtime as needed for sleep.  Dispense: 30 tablet; Refill: 3  5. Urinary retention Foley has been discontinued Patient advised to schedule follow-up with urology

## 2017-03-19 NOTE — Addendum Note (Signed)
Addended by: Jaclyn ShaggyAMAO, Keith Cancio on: 03/19/2017 03:30 PM   Modules accepted: Orders

## 2017-03-24 ENCOUNTER — Ambulatory Visit: Payer: Self-pay | Admitting: Pharmacist

## 2017-03-24 MED FILL — ?FLUOXETINE HCL 20MG TABLET: 20 | 30 days supply | Qty: 30 | Fill #1

## 2017-03-24 MED FILL — ?TRAZODONE 100 MG TABLET: 100 | 30 days supply | Qty: 30 | Fill #1

## 2017-03-25 ENCOUNTER — Telehealth: Payer: Self-pay

## 2017-03-25 NOTE — Telephone Encounter (Signed)
Call received from the patient. He stated that he thinks [facility]. Informed him that he missed an appointment with the pharmacist for diabetic education. He said that he would like to reschedule.  He also requested the phone # for [facility] because he needs to inquire about the bus passes.   Provided him with the # for [phone].  Informed him that this CM would call him back to schedule the appointment for DM teaching.   Attempted to contact the patient. Call placed to # [phone] (M) and the phone stopped ringing after multiple rings, unable to leave a message.

## 2017-03-26 ENCOUNTER — Telehealth: Payer: Self-pay

## 2017-03-26 NOTE — Telephone Encounter (Signed)
Attempted to contact the patient to schedule an appointment for diabetic teaching. Call placed to # [phone] (M) and a HIPAA compliant voicemail message was left requesting a call back to # [phone]

## 2017-03-26 NOTE — Telephone Encounter (Signed)
Call placed to the patient and scheduled an appointment with Mark FishStacey Graham, [facility] for diabetic teaching. On 03/31/17 @ 1500.  Instructed him to bring his glucometer with him to the appointment.

## 2017-03-29 ENCOUNTER — Telehealth: Payer: Self-pay

## 2017-03-29 NOTE — Telephone Encounter (Signed)
Met with the patient when he was in the clinic today and reminded him of his appointment on 03/31/17 @ 1500 with Theda Sers, [facility] for DM education and glucometer teaching.  Also reminded him to call  [facility] transportation about obtaining bus passes.

## 2017-03-31 ENCOUNTER — Ambulatory Visit: Payer: Self-pay | Attending: Family Medicine | Admitting: Pharmacist

## 2017-03-31 DIAGNOSIS — Z79899 Other long term (current) drug therapy: Secondary | ICD-10-CM | POA: Insufficient documentation

## 2017-03-31 DIAGNOSIS — E119 Type 2 diabetes mellitus without complications: Secondary | ICD-10-CM | POA: Insufficient documentation

## 2017-03-31 NOTE — Patient Instructions (Signed)

## 2017-03-31 NOTE — Progress Notes (Signed)
    S:     No chief complaint on file.   Patient arrives in good spirits.  Presents for diabetes blood glucose meter education at the request of Dr. Venetia NightAmao. Patient was referred on 03/19/17.  Patient was last seen by Primary Care Provider on 03/19/17.   Patient reports adherence with medications.  Current diabetes medications include: metformin 500 mg BID  Patient denies hypoglycemic events.  Patient didn't bring meter with him.  O:  Physical Exam   ROS   Lab Results  Component Value Date   HGBA1C 7.2 (H) 12/13/2016   There were no vitals filed for this visit.   A/P: Diabetes longstanding currently uncontrolled with A1c of 7.2. Patient denies hypoglycemic events and is able to verbalize appropriate hypoglycemia management plan. Patient reports adherence with medication. Control is suboptimal due to dietary indiscretion.  Provided diabetes meter education with meter in clinic. Patient was not able to self test as he didn't have her personal meter. Patient was able to verbalize how to use meter. He will try to use it at home and if he is unable to use it, he will return with meter. Also encouraged patient to drink more water.  Next A1C anticipated at next visit.    Written patient instructions provided.  Total time in face to face counseling 25 minutes.   Follow up in Pharmacist Clinic Visit PRN.   Patient seen with Dutch QuintKaley Whitesell, PharmD Candidate and Louis MatteJay Worthington, PharmD Candidate.

## 2017-04-05 ENCOUNTER — Telehealth: Payer: Self-pay

## 2017-04-05 NOTE — Telephone Encounter (Signed)
Call received from the patient. He stated that he would like to speak to the SW to discuss employment opportunities and requested a call back from the SW this week.  Message routed to Jenel LucksJasmine Lewis, LCSW

## 2017-04-06 ENCOUNTER — Telehealth (INDEPENDENT_AMBULATORY_CARE_PROVIDER_SITE_OTHER): Payer: Self-pay | Admitting: Family Medicine

## 2017-04-06 ENCOUNTER — Telehealth: Payer: Self-pay | Admitting: Family Medicine

## 2017-04-06 NOTE — Telephone Encounter (Signed)
Calls placed to [facility] [phone]), [facility]ational [facility] [phone] and [employer] [place] [phone]) to get more information regarding their supported employment program and criteria.   Spoke with Okey DupreKetina Headen from [facility] and she informed me patient can benefit from their [employer]. Referral form was faxed over. It takes about a week for determination and for patient to be contacted after form and CCA has been filled and faxed back to them.  Vocational Rehabilitation has a supported employment program available to patient. A referral needs to be placed by the patient or staff member. It needs to include patient's name, DOB, address, phone, last 4 digit of SS, age, disability or social security benefits, food stamps, and medicare/medicaid/insurance information. Patient will then have to attend their orientation, fill out forms and meet with counselor before placement (for employment) is placed. Orientations take place on Mondays starting in July and are 45 minutes long. Contacted [employer] no longer has the supported employment program.  Information will be forwarded to Johnson & JohnsonLCSW as well as referral form from [employer]

## 2017-04-06 NOTE — Telephone Encounter (Signed)
Error

## 2017-04-07 ENCOUNTER — Telehealth: Payer: Self-pay | Admitting: Licensed Clinical Social Worker

## 2017-04-07 NOTE — Telephone Encounter (Signed)
LCSWA contacted pt via telephone. LCSWA introduced self and explained role at [facility]. LCSWA disclosed that she received a consult from RN CM Robyne PeersJane Brazeau to discuss employment resources.   Pt reported financial strain and disclosed transportation barriers. LCSWA provided pt with resources nearby pt's residence to assist with individual career counseling and occupational skill training. Pt was informed of [employer] and was provided community resources for food insecurity. No additional concerns noted.

## 2017-04-19 ENCOUNTER — Other Ambulatory Visit: Payer: Self-pay | Admitting: Family Medicine

## 2017-04-19 MED FILL — ?METFORMIN HCL 500MG TABLET: 500 | 30 days supply | Qty: 60 | Fill #1

## 2017-04-19 MED FILL — FINASTERIDE 5 MG TABLET: 5 | 30 days supply | Qty: 30 | Fill #2

## 2017-04-26 MED FILL — ?TRAZODONE 100 MG TABLET: 100 | 30 days supply | Qty: 30 | Fill #2

## 2017-04-26 MED FILL — ?FLUOXETINE HCL 20MG TABLET: 20 | 30 days supply | Qty: 30 | Fill #2

## 2017-06-02 ENCOUNTER — Telehealth: Payer: Self-pay

## 2017-06-02 MED FILL — TRUE METRIX TEST STRIP: 30 days supply | Qty: 100 | Fill #1

## 2017-06-02 NOTE — Telephone Encounter (Signed)
Call received from the patient stating that he received a letter about " finances" and would like to meet with Fay Recordsiane Boyd, [facility] Financial Counselor,when he is at the clinic tomorrow to pick up medication.  Informed him that this CM would notify Ms Leavy CellaBoyd.

## 2017-06-03 ENCOUNTER — Telehealth: Payer: Self-pay

## 2017-06-03 NOTE — Telephone Encounter (Signed)
Mark Graham - the patient called requesting you call him # [phone].  thanks

## 2017-06-09 ENCOUNTER — Other Ambulatory Visit: Payer: Self-pay | Admitting: Family Medicine

## 2017-06-09 DIAGNOSIS — F320 Major depressive disorder, single episode, mild: Secondary | ICD-10-CM

## 2017-06-09 DIAGNOSIS — G4709 Other insomnia: Secondary | ICD-10-CM

## 2017-06-09 MED FILL — ?METFORMIN HCL 500MG TABLET: 500 | 30 days supply | Qty: 60 | Fill #2

## 2017-06-09 MED FILL — traZODone HCL 100 MG TABS: 100 | 30 days supply | Qty: 30 | Fill #3

## 2017-06-09 MED FILL — ?FLUOXETINE HCL 20MG TABLET: 20 | 30 days supply | Qty: 30 | Fill #3

## 2017-06-09 MED FILL — FINASTERIDE 5 MG TABLET: 5 | 30 days supply | Qty: 30 | Fill #3

## 2017-06-18 ENCOUNTER — Ambulatory Visit: Payer: Self-pay | Admitting: Family Medicine

## 2017-08-13 ENCOUNTER — Other Ambulatory Visit: Payer: Self-pay | Admitting: Family Medicine

## 2017-08-13 DIAGNOSIS — F320 Major depressive disorder, single episode, mild: Secondary | ICD-10-CM

## 2017-08-13 DIAGNOSIS — G4709 Other insomnia: Secondary | ICD-10-CM

## 2017-11-24 ENCOUNTER — Encounter (HOSPITAL_COMMUNITY): Payer: Self-pay

## 2017-11-24 ENCOUNTER — Inpatient Hospital Stay (HOSPITAL_COMMUNITY)
Admission: EM | Admit: 2017-11-24 | Discharge: 2017-12-01 | DRG: 240 | Disposition: A | Discharge status: Skilled Nursing Facility | Payer: Self-pay | Attending: Oncology | Admitting: Oncology

## 2017-11-24 ENCOUNTER — Emergency Department (HOSPITAL_COMMUNITY): Payer: Self-pay

## 2017-11-24 ENCOUNTER — Other Ambulatory Visit: Payer: Self-pay

## 2017-11-24 DIAGNOSIS — F309 Manic episode, unspecified: Secondary | ICD-10-CM | POA: Diagnosis present

## 2017-11-24 DIAGNOSIS — M869 Osteomyelitis, unspecified: Secondary | ICD-10-CM | POA: Diagnosis present

## 2017-11-24 DIAGNOSIS — Z8611 Personal history of tuberculosis: Secondary | ICD-10-CM

## 2017-11-24 DIAGNOSIS — F17210 Nicotine dependence, cigarettes, uncomplicated: Secondary | ICD-10-CM | POA: Diagnosis present

## 2017-11-24 DIAGNOSIS — X31XXXA Exposure to excessive natural cold, initial encounter: Secondary | ICD-10-CM

## 2017-11-24 DIAGNOSIS — I10 Essential (primary) hypertension: Secondary | ICD-10-CM | POA: Diagnosis present

## 2017-11-24 DIAGNOSIS — T383X6A Underdosing of insulin and oral hypoglycemic [antidiabetic] drugs, initial encounter: Secondary | ICD-10-CM | POA: Diagnosis present

## 2017-11-24 DIAGNOSIS — R402413 Glasgow coma scale score 13-15, at hospital admission: Secondary | ICD-10-CM | POA: Diagnosis present

## 2017-11-24 DIAGNOSIS — G629 Polyneuropathy, unspecified: Secondary | ICD-10-CM | POA: Diagnosis present

## 2017-11-24 DIAGNOSIS — R5082 Postprocedural fever: Secondary | ICD-10-CM | POA: Diagnosis present

## 2017-11-24 DIAGNOSIS — L0390 Cellulitis, unspecified: Secondary | ICD-10-CM

## 2017-11-24 DIAGNOSIS — Z79899 Other long term (current) drug therapy: Secondary | ICD-10-CM

## 2017-11-24 DIAGNOSIS — E0800 Diabetes mellitus due to underlying condition with hyperosmolarity without nonketotic hyperglycemic-hyperosmolar coma (NKHHC): Secondary | ICD-10-CM

## 2017-11-24 DIAGNOSIS — E1152 Type 2 diabetes mellitus with diabetic peripheral angiopathy with gangrene: Principal | ICD-10-CM | POA: Diagnosis present

## 2017-11-24 DIAGNOSIS — I96 Gangrene, not elsewhere classified: Secondary | ICD-10-CM

## 2017-11-24 DIAGNOSIS — F1910 Other psychoactive substance abuse, uncomplicated: Secondary | ICD-10-CM | POA: Diagnosis present

## 2017-11-24 DIAGNOSIS — L03116 Cellulitis of left lower limb: Secondary | ICD-10-CM | POA: Diagnosis present

## 2017-11-24 DIAGNOSIS — E1169 Type 2 diabetes mellitus with other specified complication: Secondary | ICD-10-CM | POA: Diagnosis present

## 2017-11-24 DIAGNOSIS — T3390XA Superficial frostbite of unspecified sites, initial encounter: Secondary | ICD-10-CM | POA: Diagnosis present

## 2017-11-24 DIAGNOSIS — Z89432 Acquired absence of left foot: Secondary | ICD-10-CM

## 2017-11-24 DIAGNOSIS — T33822A Superficial frostbite of left foot, initial encounter: Secondary | ICD-10-CM | POA: Diagnosis present

## 2017-11-24 DIAGNOSIS — N400 Enlarged prostate without lower urinary tract symptoms: Secondary | ICD-10-CM | POA: Diagnosis present

## 2017-11-24 DIAGNOSIS — Z23 Encounter for immunization: Secondary | ICD-10-CM

## 2017-11-24 DIAGNOSIS — Z591 Inadequate housing: Secondary | ICD-10-CM

## 2017-11-24 DIAGNOSIS — Z7984 Long term (current) use of oral hypoglycemic drugs: Secondary | ICD-10-CM

## 2017-11-24 DIAGNOSIS — F5104 Psychophysiologic insomnia: Secondary | ICD-10-CM | POA: Diagnosis present

## 2017-11-24 HISTORY — DX: Unspecified osteoarthritis, unspecified site: M19.90

## 2017-11-24 HISTORY — DX: Cellulitis of left lower limb: L03.116

## 2017-11-24 HISTORY — DX: Pure hypercholesterolemia, unspecified: E78.00

## 2017-11-24 HISTORY — DX: Respiratory tuberculosis unspecified: A15.9

## 2017-11-24 HISTORY — DX: Type 2 diabetes mellitus without complications: E11.9

## 2017-11-24 HISTORY — DX: Osteomyelitis, unspecified: M86.9

## 2017-11-24 LAB — COMPREHENSIVE METABOLIC PANEL
ALT: 10 U/L — AB (ref 17–63)
ANION GAP: 10 (ref 5–15)
AST: 17 U/L (ref 15–41)
Albumin: 3.2 g/dL — ABNORMAL LOW (ref 3.5–5.0)
Alkaline Phosphatase: 129 U/L — ABNORMAL HIGH (ref 38–126)
BUN: 9 mg/dL (ref 6–20)
CHLORIDE: 100 mmol/L — AB (ref 101–111)
CO2: 26 mmol/L (ref 22–32)
CREATININE: 1.05 mg/dL (ref 0.61–1.24)
Calcium: 9 mg/dL (ref 8.9–10.3)
Glucose, Bld: 185 mg/dL — ABNORMAL HIGH (ref 65–99)
Potassium: 4.7 mmol/L (ref 3.5–5.1)
SODIUM: 136 mmol/L (ref 135–145)
Total Bilirubin: 0.4 mg/dL (ref 0.3–1.2)
Total Protein: 7.1 g/dL (ref 6.5–8.1)

## 2017-11-24 LAB — CBC WITH DIFFERENTIAL/PLATELET
BASOS PCT: 1 %
Basophils Absolute: 0.1 10*3/uL (ref 0.0–0.1)
EOS ABS: 0.1 10*3/uL (ref 0.0–0.7)
Eosinophils Relative: 1 %
HEMATOCRIT: 39.7 % (ref 39.0–52.0)
HEMOGLOBIN: 13.1 g/dL (ref 13.0–17.0)
Lymphocytes Relative: 18 %
Lymphs Abs: 1.8 10*3/uL (ref 0.7–4.0)
MCH: 32.2 pg (ref 26.0–34.0)
MCHC: 33 g/dL (ref 30.0–36.0)
MCV: 97.5 fL (ref 78.0–100.0)
Monocytes Absolute: 0.8 10*3/uL (ref 0.1–1.0)
Monocytes Relative: 8 %
NEUTROS ABS: 7.4 10*3/uL (ref 1.7–7.7)
NEUTROS PCT: 72 %
Platelets: 390 10*3/uL (ref 150–400)
RBC: 4.07 MIL/uL — AB (ref 4.22–5.81)
RDW: 14.5 % (ref 11.5–15.5)
WBC: 10.1 10*3/uL (ref 4.0–10.5)

## 2017-11-24 LAB — MRSA PCR SCREENING: MRSA by PCR: NEGATIVE

## 2017-11-24 LAB — I-STAT CG4 LACTIC ACID, ED: Lactic Acid, Venous: 1.82 mmol/L (ref 0.5–1.9)

## 2017-11-24 LAB — GLUCOSE, CAPILLARY
Glucose-Capillary: 134 mg/dL — ABNORMAL HIGH (ref 65–99)
Glucose-Capillary: 123 mg/dL — ABNORMAL HIGH (ref 65–99)

## 2017-11-24 MED ORDER — CHLORHEXIDINE GLUCONATE 4 % EX LIQD
60.0000 mL | Freq: Once | CUTANEOUS | Status: AC
  Administered 2017-11-25: 4 via TOPICAL
  Filled 2017-11-24: qty 60

## 2017-11-24 MED ORDER — ACETAMINOPHEN 325 MG PO TABS
650.0000 mg | ORAL_TABLET | Freq: Four times a day (QID) | ORAL | Status: DC | PRN
  Administered 2017-11-26 – 2017-11-30 (×5): 650 mg via ORAL
  Filled 2017-11-24 (×5): qty 2

## 2017-11-24 MED ORDER — POVIDONE-IODINE 10 % EX SWAB
2.0000 "application " | Freq: Once | CUTANEOUS | Status: AC
  Administered 2017-11-25: 2 via TOPICAL

## 2017-11-24 MED ORDER — PIPERACILLIN-TAZOBACTAM 3.375 G IVPB 30 MIN
3.3750 g | Freq: Once | INTRAVENOUS | Status: DC
  Administered 2017-11-24: 3.375 g via INTRAVENOUS
  Filled 2017-11-24: qty 50

## 2017-11-24 MED ORDER — INSULIN ASPART 100 UNIT/ML ~~LOC~~ SOLN
0.0000 [IU] | Freq: Three times a day (TID) | SUBCUTANEOUS | Status: DC
  Administered 2017-11-24: 2 [IU] via SUBCUTANEOUS

## 2017-11-24 MED ORDER — NICOTINE 14 MG/24HR TD PT24
14.0000 mg | MEDICATED_PATCH | Freq: Every day | TRANSDERMAL | Status: DC
  Administered 2017-11-24 – 2017-12-01 (×8): 14 mg via TRANSDERMAL
  Filled 2017-11-24 (×8): qty 1

## 2017-11-24 MED ORDER — CEFAZOLIN SODIUM-DEXTROSE 1-4 GM/50ML-% IV SOLN
1.0000 g | Freq: Three times a day (TID) | INTRAVENOUS | Status: DC
  Administered 2017-11-24 – 2017-11-26 (×5): 1 g via INTRAVENOUS
  Filled 2017-11-24 (×7): qty 50

## 2017-11-24 MED ORDER — CEFAZOLIN SODIUM-DEXTROSE 2-4 GM/100ML-% IV SOLN
2.0000 g | INTRAVENOUS | Status: AC
  Administered 2017-11-25: 2 g via INTRAVENOUS
  Filled 2017-11-24 (×2): qty 100

## 2017-11-24 MED ORDER — INFLUENZA VAC SPLIT QUAD 0.5 ML IM SUSY
0.5000 mL | PREFILLED_SYRINGE | INTRAMUSCULAR | Status: AC
  Administered 2017-11-26: 0.5 mL via INTRAMUSCULAR
  Filled 2017-11-24: qty 0.5

## 2017-11-24 MED ORDER — LISINOPRIL 10 MG PO TABS
10.0000 mg | ORAL_TABLET | Freq: Every day | ORAL | Status: DC
  Administered 2017-11-24 – 2017-12-01 (×7): 10 mg via ORAL
  Filled 2017-11-24 (×8): qty 1

## 2017-11-24 NOTE — Consult Note (Signed)
Reason for Consult:Left foot infection Referring Physician: Raliegh Ip Lael Graham is an 62 y.o. male.  HPI: Mark Graham was out in the snow about six weeks ago. When he took off his boots the skin peeled off several of his toes, especially on the left. The great toe turned black a few days later. They've been getting progressively worse since then. He did not seek care due to lack of insurance. Over the last several days the foot has gotten swollen and red and he came to the ED for evaluation. X-rays showed osteo and orthopedic surgery was consulted.  Past Medical History:  Diagnosis Date  . Diabetes mellitus without complication ([place])   . Hypertension   . Tobacco abuse     History reviewed. No pertinent surgical history.  Family History  Problem Relation Age of Onset  . Cancer Father   . Cancer Sister   . Diabetes Mellitus II Unknown        States 6 out of 28 of his siblings     Social History:  reports that he has been smoking cigarettes.  He has been smoking about 0.25 packs per day. He uses smokeless tobacco. He reports that he drinks about 7.2 oz of alcohol per week. He reports that he uses drugs. Drug: Marijuana.  Allergies: No Known Allergies  Medications: I have reviewed the patient's current medications.  Results for orders placed or performed during the hospital encounter of 11/24/17 (from the past 48 hour(s))  Comprehensive metabolic panel     Status: Abnormal   Collection Time: 11/24/17  9:27 AM  Result Value Ref Range   Sodium 136 135 - 145 mmol/L   Potassium 4.7 3.5 - 5.1 mmol/L   Chloride 100 (L) 101 - 111 mmol/L   CO2 26 22 - 32 mmol/L   Glucose, Bld 185 (H) 65 - 99 mg/dL   BUN 9 6 - 20 mg/dL   Creatinine, Ser 1.05 0.61 - 1.24 mg/dL   Calcium 9.0 8.9 - 10.3 mg/dL   Total Protein 7.1 6.5 - 8.1 g/dL   Albumin 3.2 (L) 3.5 - 5.0 g/dL   AST 17 15 - 41 U/L   ALT 10 (L) 17 - 63 U/L   Alkaline Phosphatase 129 (H) 38 - 126 U/L   Total Bilirubin 0.4 0.3 - 1.2 mg/dL    GFR calc non Af Amer >60 >60 mL/min   GFR calc Af Amer >60 >60 mL/min    Comment: (NOTE) The eGFR has been calculated using the CKD EPI equation. This calculation has not been validated in all clinical situations. eGFR's persistently <60 mL/min signify possible Chronic Kidney Disease.    Anion gap 10 5 - 15  CBC with Differential     Status: Abnormal   Collection Time: 11/24/17  9:27 AM  Result Value Ref Range   WBC 10.1 4.0 - 10.5 K/uL   RBC 4.07 (L) 4.22 - 5.81 MIL/uL   Hemoglobin 13.1 13.0 - 17.0 g/dL   HCT 39.7 39.0 - 52.0 %   MCV 97.5 78.0 - 100.0 fL   MCH 32.2 26.0 - 34.0 pg   MCHC 33.0 30.0 - 36.0 g/dL   RDW 14.5 11.5 - 15.5 %   Platelets 390 150 - 400 K/uL   Neutrophils Relative % 72 %   Neutro Abs 7.4 1.7 - 7.7 K/uL   Lymphocytes Relative 18 %   Lymphs Abs 1.8 0.7 - 4.0 K/uL   Monocytes Relative 8 %   Monocytes Absolute  0.8 0.1 - 1.0 K/uL   Eosinophils Relative 1 %   Eosinophils Absolute 0.1 0.0 - 0.7 K/uL   Basophils Relative 1 %   Basophils Absolute 0.1 0.0 - 0.1 K/uL  I-Stat CG4 Lactic Acid, ED     Status: None   Collection Time: 11/24/17  9:40 AM  Result Value Ref Range   Lactic Acid, Venous 1.82 0.5 - 1.9 mmol/L    Dg Foot Complete Left  Result Date: 11/24/2017 CLINICAL DATA:  Frostbite, necrosis of toes EXAM: LEFT FOOT - COMPLETE 3+ VIEW COMPARISON:  None. FINDINGS: Soft tissue necrosis involving the distal aspect of the 1st through 5th digits. Additional soft tissue gas along the 1st proximal phalanx. Cortical lucency/destruction involving the 1st distal tuft, reflecting osteomyelitis. Involvement of the 2nd through 5th distal phalanges is suspected. Mild diffuse soft tissue swelling. IMPRESSION: Osteomyelitis involving the 1st distal tuft. Additional soft tissue necrosis involving the 1st proximal phalanx. Soft tissue necrosis involving the distal aspect of the 2nd through 5th digits, underlying osseous involvement suspected. Electronically Signed   By: Julian Hy M.D.   On: 11/24/2017 11:35   Dg Foot Complete Right  Result Date: 11/24/2017 CLINICAL DATA:  Open wounds across distal plantar foot, possible frostbite EXAM: RIGHT FOOT COMPLETE - 3+ VIEW COMPARISON:  None. FINDINGS: No fracture or dislocation is seen. No radiographic findings to suggest acute osteomyelitis. The joint spaces are preserved. Visualized soft tissues are grossly unremarkable. IMPRESSION: Negative. Electronically Signed   By: Julian Hy M.D.   On: 11/24/2017 11:32    Review of Systems  Constitutional: Negative for weight loss.  HENT: Negative for ear discharge, ear pain, hearing loss and tinnitus.   Eyes: Negative for blurred vision, double vision, photophobia and pain.  Respiratory: Negative for cough, sputum production and shortness of breath.   Cardiovascular: Negative for chest pain.  Gastrointestinal: Negative for abdominal pain, nausea and vomiting.  Genitourinary: Negative for dysuria, flank pain, frequency and urgency.  Musculoskeletal: Positive for joint pain (Left foot). Negative for back pain, falls, myalgias and neck pain.  Neurological: Negative for dizziness, tingling, sensory change, focal weakness, loss of consciousness and headaches.  Endo/Heme/Allergies: Does not bruise/bleed easily.  Psychiatric/Behavioral: Negative for depression, memory loss and substance abuse. The patient is not nervous/anxious.    Blood pressure (!) 163/99, pulse 89, temperature 98.5 F (36.9 C), temperature source Oral, resp. rate 18, SpO2 99 %. Physical Exam  Constitutional: He appears well-developed and well-nourished. No distress.  HENT:  Head: Normocephalic and atraumatic.  Eyes: Conjunctivae are normal. Right eye exhibits no discharge. Left eye exhibits no discharge. No scleral icterus.  Neck: Normal range of motion.  Cardiovascular: Normal rate and regular rhythm.  Respiratory: Effort normal. No respiratory distress.  Musculoskeletal:  RLE No traumatic  wounds, ecchymosis, or rash  Nontender  No knee or ankle effusion  Knee stable to varus/ valgus and anterior/posterior stress  Sens DPN, SPN, TN paresthetic  Motor EHL, ext, flex, evers 5/5  DP 2+, PT 2+, No significant edema  LLE No ecchymosis, all 5 toes show necrosis, some have autoamputated, malodorous, erythema to midfoot with 3+ edema  Nontender  No knee or ankle effusion  Knee stable to varus/ valgus and anterior/posterior stress  Sens DPN, SPN, TN paresthetic  Motor EHL, ext, flex, evers 5/5  DP 2+, PT 2+, No significant edema  Neurological: He is alert.  Skin: Skin is warm and dry. He is not diaphoretic.  Psychiatric: He has a normal mood and  affect. His behavior is normal.      Assessment/Plan: Left foot frostbite complicated by DM with phalangeal necrosis, osteomyelitis -- He needs transmetatarsal amputation. Dr. Doreatha Martin to evaluate later today. Will plan on amputation tomorrow morning, NPO after midnight. 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 Will put him at risk for further injury.  Untreated DM, HTN, tobacco abuse -- per primary service    Lisette Abu, PA-C Orthopedic Surgery [phone] 11/24/2017, 1:27 PM

## 2017-11-24 NOTE — Progress Notes (Signed)
Mark Graham is a 62 y.o. male patient admitted from ED awake, alert - oriented  X 4 - no acute distress noted.  VSS - Blood pressure (!) 158/84, pulse 83, temperature 98.8 F (37.1 C), temperature source Oral, resp. rate 16, height 5\' 11"  (1.803 m), weight 65.4 kg (144 lb 2.9 oz), SpO2 95 %.    IV in place, occlusive dsg intact without redness.  Orientation to room, and floor completed with information packet given to patient/family.  Patient declined safety video at this time.  Admission INP armband ID verified with patient/family, and in place.   SR up x 2, fall assessment complete, with patient and family able to verbalize understanding of risk associated with falls, and verbalized understanding to call nsg before up out of bed.  Call light within reach, patient able to voice, and demonstrate understanding.  Skin, clean-dry- intact without evidence of bruising, or skin tears.   No evidence of skin break down noted on exam.     Will cont to eval and treat per MD orders.  Eligah EastErin M Gael Londo, RN 11/24/2017 6:07 PM

## 2017-11-24 NOTE — ED Notes (Signed)
Patient transported to X-ray 

## 2017-11-24 NOTE — ED Provider Notes (Signed)
[name] EMERGENCY DEPARTMENT Provider Note   CSN: [id number] Arrival date & time: 11/24/17  4098     History   Chief Complaint Chief Complaint  Patient presents with  . frost bite x 6 weeks    HPI Mark Graham is a 62 y.o. male.  HPI Patient presents the emergency department complaining of redness pain and discharge coming from his left foot.  He states that he thinks he may have received frostbite to his left toe 6 weeks ago and since then he is developed worsening necrosis of the distal aspects of his left foot.  He has had redness and pain as well as a burning sensation.  Pain is mild in severity.  No fevers.  No other complaints.   Past Medical History:  Diagnosis Date  . Diabetes mellitus without complication (HCC)   . Hypertension   . Tobacco abuse     Patient Active Problem List   Diagnosis Date Noted  . Frostbite 11/24/2017  . Cellulitis 11/24/2017  . Depression 02/02/2017  . Insomnia 02/02/2017  . Protein-calorie malnutrition, severe 01/24/2017  . Urinary tract infection without hematuria   . Psychosis (HCC) 12/13/2016  . Weight loss 12/13/2016  . Abnormal ECG 12/13/2016  . Iron overload 12/13/2016  . Elevated bilirubin 12/13/2016  . PSA elevation 12/13/2016  . Diabetes mellitus (HCC) 12/12/2016  . Tobacco abuse 10/21/2015  . Hyperglycemia 10/21/2015  . Urinary frequency 10/21/2015  . RUQ abdominal pain 10/21/2015  . Hypertension 03/03/2014  . Hyponatremia 03/03/2014  . Chest pain 03/03/2014  . Leukocytosis 03/03/2014  . Urinary retention 03/03/2014    History reviewed. No pertinent surgical history.     Home Medications    Prior to Admission medications   Medication Sig Start Date End Date Taking? Authorizing Provider  amLODipine (NORVASC) 5 MG tablet Take 1 tablet (5 mg total) by mouth daily. Patient not taking: Reported on 11/24/2017 01/25/17   Wendee Beavers, DO  atorvastatin (LIPITOR) 20 MG tablet Take 1 tablet (20  mg total) by mouth daily. Patient not taking: Reported on 11/24/2017 03/19/17   Hoy Register, MD  Blood Glucose Monitoring Suppl (TRUE METRIX METER) DEVI 1 each by Does not apply route daily. 02/02/17   Hoy Register, MD  finasteride (PROSCAR) 5 MG tablet Take 1 tablet (5 mg total) by mouth daily. Patient not taking: Reported on 11/24/2017 01/25/17   Wendee Beavers, DO  FLUoxetine (PROZAC) 20 MG tablet TAKE 1 TABLET BY MOUTH DAILY. Patient not taking: Reported on 11/24/2017 08/16/17   Hoy Register, MD  glucose blood (TRUE METRIX BLOOD GLUCOSE TEST) test strip Use daily before breakfast 02/02/17   Hoy Register, MD  hydrocortisone (ANUSOL-HC) 2.5 % rectal cream Place rectally 2 (two) times daily as needed for hemorrhoids or itching. Patient not taking: Reported on 11/24/2017 01/25/17   Wendee Beavers, DO  lidocaine (XYLOCAINE) 2 % solution Use as directed 10 mLs in the mouth or throat as needed for mouth pain. Patient not taking: Reported on 02/16/2017 02/02/17   Hoy Register, MD  metFORMIN (GLUCOPHAGE) 500 MG tablet Take 1 tablet (500 mg total) by mouth 2 (two) times daily with a meal. Patient not taking: Reported on 11/24/2017 02/02/17   Hoy Register, MD  tamsulosin (FLOMAX) 0.4 MG CAPS capsule Take 2 capsules (0.8 mg total) by mouth daily. Patient not taking: Reported on 11/24/2017 01/25/17   Wendee Beavers, DO  traZODone (DESYREL) 100 MG tablet TAKE 1 TABLET BY MOUTH AT BEDTIME  AS NEEDED FOR SLEEP. Patient not taking: Reported on 11/24/2017 08/16/17   Hoy RegisterNewlin, Enobong, MD  TRUEPLUS LANCETS 28G MISC 1 each by Does not apply route daily. 02/02/17   Hoy RegisterNewlin, Enobong, MD    Family History Family History  Problem Relation Age of Onset  . Cancer Father   . Cancer Sister   . Diabetes Mellitus II Unknown        States 6 out of 13 of his siblings     Social History Social History   Tobacco Use  . Smoking status: Current Every Day Smoker    Packs/day: 0.25    Types: Cigarettes  .  Smokeless tobacco: Current User  Substance Use Topics  . Alcohol use: Yes    Alcohol/week: 7.2 oz    Types: 12 Cans of beer per week    Comment: last drink was 01/28/17  . Drug use: Yes    Types: Marijuana    Comment: 17 years since he "smoked a joint"     Allergies   Patient has no known allergies.   Review of Systems Review of Systems  All other systems reviewed and are negative.    Physical Exam Updated Vital Signs BP 137/78   Pulse 88   Temp 98.5 F (36.9 C) (Oral)   Resp 17   SpO2 93%   Physical Exam  Constitutional: He is oriented to person, place, and time. He appears well-developed and well-nourished.  HENT:  Head: Normocephalic.  Eyes: EOM are normal.  Neck: Normal range of motion.  Pulmonary/Chest: Effort normal.  Abdominal: He exhibits no distension.  Musculoskeletal:  PT and DP pulse in left foot.  Erythema of left foot and distal 1/3 of left leg with associated swelling. Foul smell. Necrotic toes on left foot  Neurological: He is alert and oriented to person, place, and time.  Psychiatric: He has a normal mood and affect.  Nursing note and vitals reviewed.    ED Treatments / Results  Labs (all labs ordered are listed, but only abnormal results are displayed) Labs Reviewed  COMPREHENSIVE METABOLIC PANEL - Abnormal; Notable for the following components:      Result Value   Chloride 100 (*)    Glucose, Bld 185 (*)    Albumin 3.2 (*)    ALT 10 (*)    Alkaline Phosphatase 129 (*)    All other components within normal limits  CBC WITH DIFFERENTIAL/PLATELET - Abnormal; Notable for the following components:   RBC 4.07 (*)    All other components within normal limits  CULTURE, BLOOD (ROUTINE X 2)  CULTURE, BLOOD (ROUTINE X 2)  BASIC METABOLIC PANEL  HIV ANTIBODY (ROUTINE TESTING)  I-STAT CG4 LACTIC ACID, ED    EKG  EKG Interpretation None       Radiology Dg Foot Complete Left  Result Date: 11/24/2017 CLINICAL DATA:  Frostbite, necrosis  of toes EXAM: LEFT FOOT - COMPLETE 3+ VIEW COMPARISON:  None. FINDINGS: Soft tissue necrosis involving the distal aspect of the 1st through 5th digits. Additional soft tissue gas along the 1st proximal phalanx. Cortical lucency/destruction involving the 1st distal tuft, reflecting osteomyelitis. Involvement of the 2nd through 5th distal phalanges is suspected. Mild diffuse soft tissue swelling. IMPRESSION: Osteomyelitis involving the 1st distal tuft. Additional soft tissue necrosis involving the 1st proximal phalanx. Soft tissue necrosis involving the distal aspect of the 2nd through 5th digits, underlying osseous involvement suspected. Electronically Signed   By: Charline BillsSriyesh  Krishnan M.D.   On: 11/24/2017 11:35  Dg Foot Complete Right  Result Date: 11/24/2017 CLINICAL DATA:  Open wounds across distal plantar foot, possible frostbite EXAM: RIGHT FOOT COMPLETE - 3+ VIEW COMPARISON:  None. FINDINGS: No fracture or dislocation is seen. No radiographic findings to suggest acute osteomyelitis. The joint spaces are preserved. Visualized soft tissues are grossly unremarkable. IMPRESSION: Negative. Electronically Signed   By: Charline Bills M.D.   On: 11/24/2017 11:32    Procedures Procedures (including critical care time)  Medications Ordered in ED Medications  insulin aspart (novoLOG) injection 0-15 Units (not administered)  nicotine (NICODERM CQ - dosed in mg/24 hours) patch 14 mg (14 mg Transdermal Patch Applied 11/24/17 1528)  ceFAZolin (ANCEF) IVPB 1 g/50 mL premix (0 g Intravenous Stopped 11/24/17 1614)  lisinopril (PRINIVIL,ZESTRIL) tablet 10 mg (10 mg Oral Given 11/24/17 1544)     Initial Impression / Assessment and Plan / ED Course  I have reviewed the triage vital signs and the nursing notes.  Pertinent labs & imaging results that were available during my care of the patient were reviewed by me and considered in my medical decision making (see chart for details).     Necrotic left toes and  left foot with associated erythema and cellulitis to the distal third of the left tibia.  Orthopedic consultation.  Hospitalist admission.  IV antibiotics.  X-ray concerning for osteomyelitis of the first metatarsal.  I personally reviewed the x-rays.  Final Clinical Impressions(s) / ED Diagnoses   Final diagnoses:  Cellulitis of left foot    ED Discharge Orders    None       Azalia Bilis, MD 11/24/17 1714

## 2017-11-24 NOTE — ED Notes (Signed)
Carb modified lunch tray ordered 

## 2017-11-24 NOTE — H&P (View-Only) (Signed)
Reason for Consult:Left foot infection Referring Physician: K Campos  Mark Graham is an 62 y.o. male.  HPI: Baby was out in the snow about six weeks ago. When he took off his boots the skin peeled off several of his toes, especially on the left. The great toe turned black a few days later. They've been getting progressively worse since then. He did not seek care due to lack of insurance. Over the last several days the foot has gotten swollen and red and he came to the ED for evaluation. X-rays showed osteo and orthopedic surgery was consulted.  Past Medical History:  Diagnosis Date  . Diabetes mellitus without complication (HCC)   . Hypertension   . Tobacco abuse     History reviewed. No pertinent surgical history.  Family History  Problem Relation Age of Onset  . Cancer Father   . Cancer Sister   . Diabetes Mellitus II Unknown        States 6 out of 13 of his siblings     Social History:  reports that he has been smoking cigarettes.  He has been smoking about 0.25 packs per day. He uses smokeless tobacco. He reports that he drinks about 7.2 oz of alcohol per week. He reports that he uses drugs. Drug: Marijuana.  Allergies: No Known Allergies  Medications: I have reviewed the patient's current medications.  Results for orders placed or performed during the hospital encounter of 11/24/17 (from the past 48 hour(s))  Comprehensive metabolic panel     Status: Abnormal   Collection Time: 11/24/17  9:27 AM  Result Value Ref Range   Sodium 136 135 - 145 mmol/L   Potassium 4.7 3.5 - 5.1 mmol/L   Chloride 100 (L) 101 - 111 mmol/L   CO2 26 22 - 32 mmol/L   Glucose, Bld 185 (H) 65 - 99 mg/dL   BUN 9 6 - 20 mg/dL   Creatinine, Ser 1.05 0.61 - 1.24 mg/dL   Calcium 9.0 8.9 - 10.3 mg/dL   Total Protein 7.1 6.5 - 8.1 g/dL   Albumin 3.2 (L) 3.5 - 5.0 g/dL   AST 17 15 - 41 U/L   ALT 10 (L) 17 - 63 U/L   Alkaline Phosphatase 129 (H) 38 - 126 U/L   Total Bilirubin 0.4 0.3 - 1.2 mg/dL    GFR calc non Af Amer >60 >60 mL/min   GFR calc Af Amer >60 >60 mL/min    Comment: (NOTE) The eGFR has been calculated using the CKD EPI equation. This calculation has not been validated in all clinical situations. eGFR's persistently <60 mL/min signify possible Chronic Kidney Disease.    Anion gap 10 5 - 15  CBC with Differential     Status: Abnormal   Collection Time: 11/24/17  9:27 AM  Result Value Ref Range   WBC 10.1 4.0 - 10.5 K/uL   RBC 4.07 (L) 4.22 - 5.81 MIL/uL   Hemoglobin 13.1 13.0 - 17.0 g/dL   HCT 39.7 39.0 - 52.0 %   MCV 97.5 78.0 - 100.0 fL   MCH 32.2 26.0 - 34.0 pg   MCHC 33.0 30.0 - 36.0 g/dL   RDW 14.5 11.5 - 15.5 %   Platelets 390 150 - 400 K/uL   Neutrophils Relative % 72 %   Neutro Abs 7.4 1.7 - 7.7 K/uL   Lymphocytes Relative 18 %   Lymphs Abs 1.8 0.7 - 4.0 K/uL   Monocytes Relative 8 %   Monocytes Absolute   0.8 0.1 - 1.0 K/uL   Eosinophils Relative 1 %   Eosinophils Absolute 0.1 0.0 - 0.7 K/uL   Basophils Relative 1 %   Basophils Absolute 0.1 0.0 - 0.1 K/uL  I-Stat CG4 Lactic Acid, ED     Status: None   Collection Time: 11/24/17  9:40 AM  Result Value Ref Range   Lactic Acid, Venous 1.82 0.5 - 1.9 mmol/L    Dg Foot Complete Left  Result Date: 11/24/2017 CLINICAL DATA:  Frostbite, necrosis of toes EXAM: LEFT FOOT - COMPLETE 3+ VIEW COMPARISON:  None. FINDINGS: Soft tissue necrosis involving the distal aspect of the 1st through 5th digits. Additional soft tissue gas along the 1st proximal phalanx. Cortical lucency/destruction involving the 1st distal tuft, reflecting osteomyelitis. Involvement of the 2nd through 5th distal phalanges is suspected. Mild diffuse soft tissue swelling. IMPRESSION: Osteomyelitis involving the 1st distal tuft. Additional soft tissue necrosis involving the 1st proximal phalanx. Soft tissue necrosis involving the distal aspect of the 2nd through 5th digits, underlying osseous involvement suspected. Electronically Signed   By: Sriyesh   Krishnan M.D.   On: 11/24/2017 11:35   Dg Foot Complete Right  Result Date: 11/24/2017 CLINICAL DATA:  Open wounds across distal plantar foot, possible frostbite EXAM: RIGHT FOOT COMPLETE - 3+ VIEW COMPARISON:  None. FINDINGS: No fracture or dislocation is seen. No radiographic findings to suggest acute osteomyelitis. The joint spaces are preserved. Visualized soft tissues are grossly unremarkable. IMPRESSION: Negative. Electronically Signed   By: Sriyesh  Krishnan M.D.   On: 11/24/2017 11:32    Review of Systems  Constitutional: Negative for weight loss.  HENT: Negative for ear discharge, ear pain, hearing loss and tinnitus.   Eyes: Negative for blurred vision, double vision, photophobia and pain.  Respiratory: Negative for cough, sputum production and shortness of breath.   Cardiovascular: Negative for chest pain.  Gastrointestinal: Negative for abdominal pain, nausea and vomiting.  Genitourinary: Negative for dysuria, flank pain, frequency and urgency.  Musculoskeletal: Positive for joint pain (Left foot). Negative for back pain, falls, myalgias and neck pain.  Neurological: Negative for dizziness, tingling, sensory change, focal weakness, loss of consciousness and headaches.  Endo/Heme/Allergies: Does not bruise/bleed easily.  Psychiatric/Behavioral: Negative for depression, memory loss and substance abuse. The patient is not nervous/anxious.    Blood pressure (!) 163/99, pulse 89, temperature 98.5 F (36.9 C), temperature source Oral, resp. rate 18, SpO2 99 %. Physical Exam  Constitutional: He appears well-developed and well-nourished. No distress.  HENT:  Head: Normocephalic and atraumatic.  Eyes: Conjunctivae are normal. Right eye exhibits no discharge. Left eye exhibits no discharge. No scleral icterus.  Neck: Normal range of motion.  Cardiovascular: Normal rate and regular rhythm.  Respiratory: Effort normal. No respiratory distress.  Musculoskeletal:  RLE No traumatic  wounds, ecchymosis, or rash  Nontender  No knee or ankle effusion  Knee stable to varus/ valgus and anterior/posterior stress  Sens DPN, SPN, TN paresthetic  Motor EHL, ext, flex, evers 5/5  DP 2+, PT 2+, No significant edema  LLE No ecchymosis, all 5 toes show necrosis, some have autoamputated, malodorous, erythema to midfoot with 3+ edema  Nontender  No knee or ankle effusion  Knee stable to varus/ valgus and anterior/posterior stress  Sens DPN, SPN, TN paresthetic  Motor EHL, ext, flex, evers 5/5  DP 2+, PT 2+, No significant edema  Neurological: He is alert.  Skin: Skin is warm and dry. He is not diaphoretic.  Psychiatric: He has a normal mood and   affect. His behavior is normal.      Assessment/Plan: Left foot frostbite complicated by DM with phalangeal necrosis, osteomyelitis -- He needs transmetatarsal amputation. Dr. Haddix to evaluate later today. Will plan on amputation tomorrow morning, NPO after midnight. 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 Will put him at risk for further injury.  Untreated DM, HTN, tobacco abuse -- per primary service    Hiyab Nhem J. Kobi Aller, PA-C Orthopedic Surgery [phone] 11/24/2017, 1:27 PM  

## 2017-11-24 NOTE — ED Triage Notes (Signed)
Patient complains of frost bite to bilateral feet after snow 6 weeks ago. Foot swelling and redness. Necrosis to left great toe with skin peeling and redness

## 2017-11-24 NOTE — H&P (Signed)
Date: 11/24/2017               Patient Name:  Mark Graham MRN: [id number]  DOB: [birth year] Age / Sex: 62 y.o., male   PCP: Mark Register, MD         Medical Service: Internal Medicine Teaching Service         Attending Physician: Dr. Earl Lagos, MD    First Contact: Dr. Evelene Graham Pager: [phone]  Second Contact: Dr. Obie Graham Pager: [phone]       After Hours (After 5p/  First Contact Pager: [phone]  weekends / holidays): Second Contact Pager: [phone]   Chief Complaint: Toes falling off   History of Present Illness:  Mark Graham is a 62 yo male with history of well-controlled T2DM, HTN, tobacco abuse who presents to the ED after losing the tip of his toes on the L foot. Patient states this started 6 weeks ago after he went out in the snow. He was wearing closed-toe shoes at the time that got soaked with water. He did not changed his shoes until hours later. The next day he noticed his L foot was very red. During the past 6 weeks, the toes in his L foot have become black and some of the nails have fallen off. He has also noticed a strong odor. He endorses worsening left lower extremity edema. He is able to ambulate but limited by pain. Denies fever, chills, dizziness, HA, chest pain, shortness of breath, cough, abdominal pain, N/V, urinary symptoms, and changes in bowel movements.   ED course:  Patient was afebrile and hemodynamically stable on arrival to the ED. BP  192/114. CBC and CMP unremarkable. Renal function normal with Cr 1. He received one dose of IV zosyn.   Review of Systems: A complete ROS was negative except as per HPI.   Meds: Patient does not take any medications at home.    Allergies: Allergies as of 11/24/2017  . (No Known Allergies)   Past Medical History:  Diagnosis Date  . Diabetes mellitus without complication (HCC)   . Hypertension   . Tobacco abuse     Family History:  Family History  Problem Relation Age of Onset  . Cancer Father   .  Cancer Sister   . Diabetes Mellitus II Unknown        States 6 out of 13 of his siblings     Social History:  Patient smokes 1ppd for the past 30 years, sometimes smokes more. He has been smoking for 46 years. He also reports drinking 1-2 six packs per week. Denies history of alcohol withdrawal. Denies drug use.    Physical Exam: Blood pressure (!) 160/87, pulse 97, temperature 98.5 F (36.9 C), temperature source Oral, resp. rate 15, SpO2 96 %.  Physical Exam  Constitutional: He is oriented to person, place, and time. He appears well-developed and well-nourished. No distress.  HENT:  Head: Normocephalic and atraumatic.  Mouth/Throat: No oropharyngeal exudate.  Eyes: Pupils are equal, round, and reactive to light. No scleral icterus.  Neck: Normal range of motion. Neck supple. No JVD present.  Cardiovascular: Normal rate, regular rhythm and normal heart sounds. Exam reveals no gallop and no friction rub.  No murmur heard. Pulses:      Dorsalis pedis pulses are 2+ on the right side, and 0 on the left side.  Pulmonary/Chest: Effort normal and breath sounds normal. No respiratory distress. He has no wheezes. He has no rales.  Abdominal:  Soft. Bowel sounds are normal. He exhibits no distension. There is no tenderness.  Musculoskeletal: Normal range of motion.  Neurological: He is alert and oriented to person, place, and time. No cranial nerve deficit.  Skin: Skin is warm. He is not diaphoretic. There is erythema.  L foot markedly swollen and erythematous. L first toe necrotic with small amount of purulent drainage. Auto-amputation of 2nd to 5th toes. Very malodorous.     EKG: none obtained   CXR: none obtained   L foot XR: Osteomyelitis involving the 1st distal tuft. Additional soft tissue necrosis involving the 1st proximal phalanx. Soft tissue necrosis involving the distal aspect of the 2nd through 5th digits, underlying osseous involvement suspected.  R foot XR: FINDINGS: No  fracture or dislocation is seen. No radiographic findings to suggest acute osteomyelitis. The joint spaces are preserved. Visualized soft tissues are grossly unremarkable.   Assessment & Plan by Problem:  # Osteomyelitis of L hallux with overlying non purulent cellulitis in dorsum of L foot: Patient presented with L hallux necrosis and autoamputation of L 2nd-5th toes. His L foot XR showed osteomyelitis of 1st distal tuft. He was seen by ortho while in the ED who recommended transmetatarsal amputation. R foot Xr  unremarkable. He was alert and oriented, afebrile and hemodynamically stable when seen. Blood cx ordered, but low suspicion for systemic infection at this time. Unfortunately, he received antibiotics prior to collection of blood cx.  - Ortho consult, appreciate recommendations. Scheduled for  L transmetatarsal amputation tomorrow - S/p IV Zosyn x1 in the ED  - Ancef x 7 days for cellulitis after blood cx are drawn  - Follow up blood cx   # HTN: On amlodipine 5 mg at home, but has not taken medications for the past 6 months or more. sBP 160-190s on admission. Will add ACEi given history of T2DM.  - Start lisinopril 10 mg QD   # T2DM: A1c 7.2 11/2016. On metformin 500 mg BID at home, but states he has not taken any medications for the past 6 months or more.  - SSI-S - CBG monitoring   # Tobacco abuse: Current smoker, 1ppd+ - Nicotine patch 14 mg QD   # BPH:  - Continue home Flomax 0.4 mg QD   # Depression # Insomnia  - Continue home Prozac 20 mg QD  - Continue home trazodone 100 mg QHS   F: none  E: monitoring N: CM diet --> NPO at MN  VTE ppx: SCDs  Code status: Full code, not confirmed on admission   Dispo: Admit patient to Inpatient with expected length of stay greater than 2 midnights.  SignedBurna Cash: Graham, [name], MD 11/24/2017, 3:02 PM  Pager: [phone]

## 2017-11-24 NOTE — Anesthesia Preprocedure Evaluation (Addendum)
Anesthesia Evaluation  Patient identified by MRN, date of birth, ID band Patient awake    Reviewed: Allergy & Precautions, NPO status , Patient's Chart, lab work & pertinent test results  History of Anesthesia Complications Negative for: history of anesthetic complications  Airway Mallampati: I       Dental  (+) Edentulous Upper, Edentulous Lower   Pulmonary neg pulmonary ROS, Current Smoker,    breath sounds clear to auscultation       Cardiovascular hypertension (does not take meds), (-) angina Rhythm:Regular Rate:Normal  12/18 ECHO: EF 60-65%, valves OK   Neuro/Psych Depression negative neurological ROS     GI/Hepatic negative GI ROS, (+)     substance abuse  marijuana use,   Endo/Other  diabetes (glu 111), Oral Hypoglycemic Agents  Renal/GU Renal disease     Musculoskeletal   Abdominal   Peds  Hematology   Anesthesia Other Findings   Reproductive/Obstetrics                            Anesthesia Physical Anesthesia Plan  ASA: III  Anesthesia Plan: Regional   Post-op Pain Management:    Induction:   PONV Risk Score and Plan: 0 and Ondansetron  Airway Management Planned: Natural Airway and Simple Face Mask  Additional Equipment:   Intra-op Plan:   Post-operative Plan:   Informed Consent: I have reviewed the patients History and Physical, chart, labs and discussed the procedure including the risks, benefits and alternatives for the proposed anesthesia with the patient or authorized representative who has indicated his/her understanding and acceptance.     Plan Discussed with: CRNA and Surgeon  Anesthesia Plan Comments: (Plan routine monitors, ankle block)        Anesthesia Quick Evaluation

## 2017-11-25 ENCOUNTER — Inpatient Hospital Stay (HOSPITAL_COMMUNITY): Payer: Self-pay

## 2017-11-25 ENCOUNTER — Inpatient Hospital Stay (HOSPITAL_COMMUNITY): Payer: Self-pay | Admitting: Anesthesiology

## 2017-11-25 ENCOUNTER — Encounter (HOSPITAL_COMMUNITY): Payer: Self-pay

## 2017-11-25 ENCOUNTER — Inpatient Hospital Stay (HOSPITAL_COMMUNITY): Admission: RE | Admit: 2017-11-25 | Payer: Self-pay | Source: Ambulatory Visit | Admitting: Student

## 2017-11-25 ENCOUNTER — Encounter (HOSPITAL_COMMUNITY)
Admission: EM | Disposition: A | Discharge status: Skilled Nursing Facility | Payer: Self-pay | Source: Home / Self Care | Attending: Internal Medicine

## 2017-11-25 DIAGNOSIS — E1169 Type 2 diabetes mellitus with other specified complication: Secondary | ICD-10-CM

## 2017-11-25 DIAGNOSIS — Z79899 Other long term (current) drug therapy: Secondary | ICD-10-CM

## 2017-11-25 DIAGNOSIS — M86172 Other acute osteomyelitis, left ankle and foot: Secondary | ICD-10-CM

## 2017-11-25 DIAGNOSIS — I10 Essential (primary) hypertension: Secondary | ICD-10-CM

## 2017-11-25 DIAGNOSIS — N400 Enlarged prostate without lower urinary tract symptoms: Secondary | ICD-10-CM

## 2017-11-25 DIAGNOSIS — G4700 Insomnia, unspecified: Secondary | ICD-10-CM

## 2017-11-25 HISTORY — PX: AMPUTATION: SHX166

## 2017-11-25 LAB — BASIC METABOLIC PANEL
Anion gap: 9 (ref 5–15)
BUN: 5 mg/dL — AB (ref 6–20)
CALCIUM: 8.8 mg/dL — AB (ref 8.9–10.3)
CO2: 27 mmol/L (ref 22–32)
CREATININE: 0.96 mg/dL (ref 0.61–1.24)
Chloride: 104 mmol/L (ref 101–111)
GFR calc Af Amer: >60 mL/min (ref >60)
GLUCOSE: 131 mg/dL — AB (ref 65–99)
Potassium: 3.7 mmol/L (ref 3.5–5.1)
SODIUM: 140 mmol/L (ref 135–145)

## 2017-11-25 LAB — GLUCOSE, CAPILLARY
GLUCOSE-CAPILLARY: 114 mg/dL — AB (ref 65–99)
GLUCOSE-CAPILLARY: 134 mg/dL — AB (ref 65–99)
Glucose-Capillary: 119 mg/dL — ABNORMAL HIGH (ref 65–99)
Glucose-Capillary: 111 mg/dL — ABNORMAL HIGH (ref 65–99)
Glucose-Capillary: 109 mg/dL — ABNORMAL HIGH (ref 65–99)
Glucose-Capillary: 170 mg/dL — ABNORMAL HIGH (ref 65–99)

## 2017-11-25 LAB — HIV ANTIBODY (ROUTINE TESTING W REFLEX): HIV Screen 4th Generation wRfx: NONREACTIVE

## 2017-11-25 LAB — HEMOGLOBIN A1C
Hgb A1c MFr Bld: 7.2 % — ABNORMAL HIGH (ref 4.8–5.6)
Mean Plasma Glucose: 159.94 mg/dL

## 2017-11-25 SURGERY — AMPUTATION, FOOT, PARTIAL
Anesthesia: Regional | Site: Foot | Laterality: Left

## 2017-11-25 MED ORDER — MEPERIDINE HCL 25 MG/ML IJ SOLN: 6.2500 mg | INTRAMUSCULAR | Status: DC | PRN

## 2017-11-25 MED ORDER — MIDAZOLAM HCL 2 MG/2ML IJ SOLN
INTRAMUSCULAR | Status: AC
  Filled 2017-11-25: qty 2

## 2017-11-25 MED ORDER — NALOXONE HCL 0.4 MG/ML IJ SOLN: 0.4000 mg | INTRAMUSCULAR | Status: DC | PRN

## 2017-11-25 MED ORDER — LACTATED RINGERS IV SOLN
INTRAVENOUS | Status: DC
  Administered 2017-11-25: 10:00:00 via INTRAVENOUS

## 2017-11-25 MED ORDER — MIDAZOLAM HCL 2 MG/2ML IJ SOLN: 0.5000 mg | Freq: Once | INTRAMUSCULAR | Status: DC | PRN

## 2017-11-25 MED ORDER — PROPOFOL 10 MG/ML IV BOLUS
INTRAVENOUS | Status: AC
  Filled 2017-11-25: qty 20

## 2017-11-25 MED ORDER — LACTATED RINGERS IV SOLN
INTRAVENOUS | Status: DC | PRN
  Administered 2017-11-25: 11:00:00 via INTRAVENOUS

## 2017-11-25 MED ORDER — INSULIN ASPART 100 UNIT/ML ~~LOC~~ SOLN
0.0000 [IU] | Freq: Three times a day (TID) | SUBCUTANEOUS | Status: DC
  Administered 2017-11-25: 2 [IU] via SUBCUTANEOUS
  Administered 2017-11-26: 1 [IU] via SUBCUTANEOUS
  Administered 2017-11-26: 2 [IU] via SUBCUTANEOUS
  Administered 2017-11-27 – 2017-11-28 (×2): 1 [IU] via SUBCUTANEOUS
  Administered 2017-12-01: 2 [IU] via SUBCUTANEOUS

## 2017-11-25 MED ORDER — HYDROMORPHONE HCL 1 MG/ML IJ SOLN: 0.2500 mg | INTRAMUSCULAR | Status: DC | PRN

## 2017-11-25 MED ORDER — MIDAZOLAM HCL 2 MG/2ML IJ SOLN
1.0000 mg | INTRAMUSCULAR | Status: DC | PRN
  Administered 2017-11-25: 1 mg via INTRAVENOUS

## 2017-11-25 MED ORDER — PROPOFOL 500 MG/50ML IV EMUL
INTRAVENOUS | Status: DC | PRN
  Administered 2017-11-25: 50 ug/kg/min via INTRAVENOUS

## 2017-11-25 MED ORDER — HYDROMORPHONE HCL 1 MG/ML IJ SOLN
0.5000 mg | INTRAMUSCULAR | Status: DC | PRN
  Administered 2017-11-25: 0.5 mg via INTRAVENOUS
  Filled 2017-11-25 (×2): qty 1

## 2017-11-25 MED ORDER — FENTANYL CITRATE (PF) 250 MCG/5ML IJ SOLN
INTRAMUSCULAR | Status: AC
  Filled 2017-11-25: qty 5

## 2017-11-25 MED ORDER — FENTANYL CITRATE (PF) 100 MCG/2ML IJ SOLN
50.0000 ug | INTRAMUSCULAR | Status: DC | PRN
  Administered 2017-11-25: 100 ug via INTRAVENOUS

## 2017-11-25 MED ORDER — BUPIVACAINE HCL (PF) 0.5 % IJ SOLN
INTRAMUSCULAR | Status: DC | PRN
  Administered 2017-11-25: 40 mL

## 2017-11-25 MED ORDER — TOBRAMYCIN SULFATE 1.2 G IJ SOLR
INTRAMUSCULAR | Status: AC
  Filled 2017-11-25: qty 1.2

## 2017-11-25 MED ORDER — HYDROMORPHONE HCL 1 MG/ML IJ SOLN
1.0000 mg | INTRAMUSCULAR | Status: DC | PRN
  Administered 2017-11-25 – 2017-11-26 (×3): 1 mg via INTRAVENOUS
  Filled 2017-11-25 (×3): qty 1

## 2017-11-25 MED ORDER — BACITRACIN 500 UNIT/GM EX OINT
TOPICAL_OINTMENT | CUTANEOUS | Status: DC | PRN
  Administered 2017-11-25: 1 via TOPICAL

## 2017-11-25 MED ORDER — PROMETHAZINE HCL 25 MG/ML IJ SOLN: 6.2500 mg | INTRAMUSCULAR | Status: DC | PRN

## 2017-11-25 MED ORDER — 0.9 % SODIUM CHLORIDE (POUR BTL) OPTIME
TOPICAL | Status: DC | PRN
  Administered 2017-11-25: 1000 mL

## 2017-11-25 MED ORDER — OXYCODONE HCL 5 MG PO TABS
5.0000 mg | ORAL_TABLET | Freq: Four times a day (QID) | ORAL | Status: DC | PRN
  Administered 2017-11-25: 5 mg via ORAL
  Filled 2017-11-25: qty 1

## 2017-11-25 MED ORDER — CEFAZOLIN SODIUM-DEXTROSE 2-4 GM/100ML-% IV SOLN: 2.0000 g | Freq: Three times a day (TID) | INTRAVENOUS | Status: DC

## 2017-11-25 MED ORDER — HYDROMORPHONE HCL 1 MG/ML IJ SOLN
1.0000 mg | INTRAMUSCULAR | Status: DC | PRN
Start: 2017-11-25 — End: 2017-11-25

## 2017-11-25 MED ORDER — OXYCODONE HCL 5 MG PO TABS
5.0000 mg | ORAL_TABLET | ORAL | Status: DC | PRN
  Administered 2017-11-25 – 2017-11-26 (×2): 5 mg via ORAL
  Filled 2017-11-25 (×2): qty 1

## 2017-11-25 MED ORDER — VANCOMYCIN HCL 1000 MG IV SOLR
INTRAVENOUS | Status: AC
  Filled 2017-11-25: qty 1000

## 2017-11-25 MED ORDER — BACITRACIN ZINC 500 UNIT/GM EX OINT
TOPICAL_OINTMENT | CUTANEOUS | Status: AC
  Filled 2017-11-25: qty 28.35

## 2017-11-25 SURGICAL SUPPLY — 55 items
BANDAGE ACE 4X5 VEL STRL LF (GAUZE/BANDAGES/DRESSINGS) ×3 IMPLANT
BLADE OSCILLATING/SAGITTAL (BLADE) ×2
BLADE SW THK.38XMED NAR THN (BLADE) ×1 IMPLANT
BNDG COHESIVE 4X5 TAN STRL (GAUZE/BANDAGES/DRESSINGS) ×3 IMPLANT
BNDG ESMARK 4X9 LF (GAUZE/BANDAGES/DRESSINGS) ×3 IMPLANT
BNDG GAUZE ELAST 4 BULKY (GAUZE/BANDAGES/DRESSINGS) ×6 IMPLANT
BRUSH SCRUB SURG 4.25 DISP (MISCELLANEOUS) ×3 IMPLANT
CHLORAPREP W/TINT 26ML (MISCELLANEOUS) ×6 IMPLANT
COVER MAYO STAND STRL (DRAPES) ×3 IMPLANT
COVER SURGICAL LIGHT HANDLE (MISCELLANEOUS) ×3 IMPLANT
CUFF TOURNIQUET SINGLE 18IN (TOURNIQUET CUFF) ×3 IMPLANT
DRAPE ORTHO SPLIT 77X108 STRL (DRAPES) ×2
DRAPE SURG 17X23 STRL (DRAPES) ×3 IMPLANT
DRAPE SURG ORHT 6 SPLT 77X108 (DRAPES) ×1 IMPLANT
DRAPE U-SHAPE 47X51 STRL (DRAPES) ×3 IMPLANT
DRSG ADAPTIC 3X8 NADH LF (GAUZE/BANDAGES/DRESSINGS) ×3 IMPLANT
ELECT REM PT RETURN 9FT ADLT (ELECTROSURGICAL) ×3
ELECTRODE REM PT RTRN 9FT ADLT (ELECTROSURGICAL) ×1 IMPLANT
EVACUATOR 1/8 PVC DRAIN (DRAIN) IMPLANT
GAUZE SPONGE 4X4 12PLY STRL (GAUZE/BANDAGES/DRESSINGS) ×3 IMPLANT
GLOVE BIO SURGEON STRL SZ7.5 (GLOVE) ×15 IMPLANT
GLOVE BIOGEL PI IND STRL 6 (GLOVE) ×1 IMPLANT
GLOVE BIOGEL PI IND STRL 6.5 (GLOVE) ×2 IMPLANT
GLOVE BIOGEL PI IND STRL 7.5 (GLOVE) ×1 IMPLANT
GLOVE BIOGEL PI INDICATOR 6 (GLOVE) ×2
GLOVE BIOGEL PI INDICATOR 6.5 (GLOVE) ×4
GLOVE BIOGEL PI INDICATOR 7.5 (GLOVE) ×2
GOWN STRL REUS W/ TWL LRG LVL3 (GOWN DISPOSABLE) ×3 IMPLANT
GOWN STRL REUS W/TWL LRG LVL3 (GOWN DISPOSABLE) ×6
HANDPIECE INTERPULSE COAX TIP (DISPOSABLE)
KIT BASIN OR (CUSTOM PROCEDURE TRAY) ×3 IMPLANT
KIT ROOM TURNOVER OR (KITS) ×3 IMPLANT
MANIFOLD NEPTUNE II (INSTRUMENTS) ×3 IMPLANT
NS IRRIG 1000ML POUR BTL (IV SOLUTION) ×3 IMPLANT
PACK ORTHO EXTREMITY (CUSTOM PROCEDURE TRAY) ×3 IMPLANT
PAD ABD 8X10 STRL (GAUZE/BANDAGES/DRESSINGS) ×3 IMPLANT
PAD ARMBOARD 7.5X6 YLW CONV (MISCELLANEOUS) ×6 IMPLANT
PAD CAST 4YDX4 CTTN HI CHSV (CAST SUPPLIES) ×1 IMPLANT
PADDING CAST COTTON 4X4 STRL (CAST SUPPLIES) ×2
PADDING CAST COTTON 6X4 STRL (CAST SUPPLIES) ×3 IMPLANT
SET HNDPC FAN SPRY TIP SCT (DISPOSABLE) IMPLANT
SPONGE LAP 18X18 X RAY DECT (DISPOSABLE) IMPLANT
SUT ETHILON 2 0 FS 18 (SUTURE) ×6 IMPLANT
SUT ETHILON 3 0 PS 1 (SUTURE) ×9 IMPLANT
SUT MON AB 2-0 CT1 36 (SUTURE) ×6 IMPLANT
SUT PDS AB 0 CT 36 (SUTURE) IMPLANT
SUT SILK 2 0 REEL (SUTURE) ×3 IMPLANT
SWAB CULTURE ESWAB REG 1ML (MISCELLANEOUS) IMPLANT
TOWEL OR 17X24 6PK STRL BLUE (TOWEL DISPOSABLE) ×3 IMPLANT
TOWEL OR 17X26 10 PK STRL BLUE (TOWEL DISPOSABLE) ×3 IMPLANT
TUBE CONNECTING 12'X1/4 (SUCTIONS) ×1
TUBE CONNECTING 12X1/4 (SUCTIONS) ×2 IMPLANT
UNDERPAD 30X30 (UNDERPADS AND DIAPERS) ×3 IMPLANT
WATER STERILE IRR 1000ML POUR (IV SOLUTION) ×3 IMPLANT
YANKAUER SUCT BULB TIP NO VENT (SUCTIONS) ×3 IMPLANT

## 2017-11-25 NOTE — Op Note (Signed)
OrthopaedicSurgeryOperativeNote (ZOX:[id number]) Date of Surgery: 11/25/2017  Admit Date: 11/24/2017   Diagnoses: Pre-Op Diagnoses: Left 1st-5th gangrenous toes  Post-Op Diagnosis: Same  Procedures: CPT 28805-Left transmetatarsal amputation  Surgeons: Primary: Roby Lofts, MD   Location:[facility] OR ROOM 08   AnesthesiaGeneral   Antibiotics:Ancef 2g preop   Tourniquettime: Total Tourniquet Time Documented: Thigh (Left) - 24 minutes Total: Thigh (Left) - 24 minutes  EstimatedBloodLoss:51mL  Complications: None  Specimens: ID Type Source Tests Collected by Time Destination  1 : LEFT FORE FOOT Amputation Foot, Left SURGICAL PATHOLOGY Roby Lofts, MD 11/25/2017 1142    Implants: none  IndicationsforSurgery: This is an individual who is 62 years old and was in the snow 6 weeks ago at which point he developed frostbite.  His great toe became necrotic and is smaller toe followeded.  He noticed a foul odor as well as increased pain and he presents emergency room where x-rays showed osteomyelitis.  I discussed with him that I did not feel that there is any way to salvage his toes.  In light of the ongoing infection in the zone of erythema I felt that proceeding with a transmetatarsal amputation would be most appropriate.  Risks and benefits were discussed with the patient including risk of bleeding requiring blood transfusion, hematoma formation, nerve and blood vessel injury, neuroma formation, need for higher amputation site, continued infection, wound healing problems, need for prosthesis, in light of all these wrist the patient wished to proceed.  Risks and benefits were extensively discussed as noted above and the patient and their family agreed to proceed with surgery and consent was obtained.  Operative Findings: Necrotic 1st-5th toes with successful transmetatarsal amputation  Procedure: The patient was identified in the preoperative holding area. Consent was  confirmed with the patient and their family and all questions were answered. The operative extremity was marked after confirmation with the patient. he was then brought back to the operating room by our anesthesia colleagues.  He was carefully transferred over to a regular or table.  Here he was placed under sedation as he had an ankle block placed in the preop holding area.  The operative extremity was then prepped and draped in usual sterile fashion. A preoperative timeout was performed to verify the patient, the procedure, and the extremity. A sterile tourniquet was placed to his left calf.  The leg was exsanguinated and the tourniquet was elevated.  I made a incision over the dorsal foot and plantar foot.  I stayed away from any compromised skin due to his infection.  He had erythema that was tracking up the distal aspect of the metatarsals.  I carried the incision through skin and subcutaneous tissue.  Identified the appropriate nerves placed on tension and cut them with a 15 blade so they retract into the soft tissues.  I did the same with her is extensor and flexor tendons.  The intrinsic muscles were kept in place as a covering for the amputation sites.  I then proceeded to identify the metatarsals.  In the proximal portion of the metatarsal I then proceeded to use a oscillating saw to complete the amputation through the first to fifth metatarsals.  I then completed the amputation and sent the distal forefoot to pathology.  I then deflated the tourniquet and obtained adequate hemostasis.  The deep neurovascular bundle was tied off with nylon suture.  I then performed a layered closure consisting of 2-0 Monocryl and 3-0 nylon.  The suture line was anterior and dorsal.  A sterile dressing consisting of bacitracin ointment, Adaptic, 4 x 4's and ABD pad with a Ace wrap was placed.  Patient was then awoken from his sedation and taken to the PACU in stable condition.  The patient may be weightbearing as  tolerated through the heel and a postop shoe.  Post Op Plan/Instructions: He will receive postoperative Ancef.  He no longer needs to be on broad-spectrum antibiotics from my standpoint as his source of infection has been eradicated.  DVT prophylaxis will be at the discretion of the primary team.  I will plan to see him back in approximately 2 weeks for suture removal.  I was present and performed the entire surgery.  Truitt MerleKevin Tisa Weisel, MD [facility]

## 2017-11-25 NOTE — Progress Notes (Signed)
Pt complaining of pain 8/10 unrelieved by 0.5mg  dilaudid IV.  Paged provider S. LaCroce, who ordered 1mg  dilaudid IV and 5mg  oxycodone IR.  Will continue to monitor.

## 2017-11-25 NOTE — Evaluation (Signed)
Physical Therapy Evaluation Patient Details Name: Mark Graham MRN: [id number] DOB: 05/12/1956 Today's Date: 11/25/2017   History of Present Illness  Pt is a 62 y.o. male s/p L transmetatarsal amputation. PMHx: DM, HTN.  Clinical Impression  Pt presented supine in bed with HOB elevated, awake and willing to participate in therapy session. Prior to admission, pt reported that he was independent with all functional mobility and ADLs. Pt lives in a single level house with four steps to enter with no hand rails. Pt reported that he has no family or friends that can assist him upon d/c. Pt tolerated mobility well with no reports of pain or dizziness. Pt ambulated a short distance within his room with RW and min guard for safety. Pt would continue to benefit from skilled physical therapy services at this time while admitted and after d/c to address the below listed limitations in order to improve overall safety and independence with functional mobility.     Follow Up Recommendations Home health PT;Supervision - Intermittent    Equipment Recommendations  Rolling walker with 5" wheels    Recommendations for Other Services       Precautions / Restrictions Precautions Precautions: Fall Restrictions Weight Bearing Restrictions: Yes LLE Weight Bearing: Weight bearing as tolerated(through heel only)      Mobility  Bed Mobility Overal bed mobility: Needs Assistance Bed Mobility: Supine to Sit     Supine to sit: Supervision     General bed mobility comments: supervision for safety  Transfers Overall transfer level: Needs assistance Equipment used: Rolling walker (2 wheeled) Transfers: Sit to/from Stand Sit to Stand: Min guard         General transfer comment: increased time, cueing for safe hand placement with RW, min guard for safety  Ambulation/Gait Ambulation/Gait assistance: Min guard Ambulation Distance (Feet): 40 Feet Assistive device: Rolling walker (2 wheeled) Gait  Pattern/deviations: Step-to pattern;Step-through pattern;Decreased step length - right;Decreased stance time - left;Decreased stride length Gait velocity: decreased Gait velocity interpretation: Below normal speed for age/gender General Gait Details: no instability or LOB with use of RW, pt able to tolerate WBAT through L heel  Stairs            Wheelchair Mobility    Modified Rankin (Stroke Patients Only)       Balance Overall balance assessment: Needs assistance Sitting-balance support: Feet supported;No upper extremity supported Sitting balance-Leahy Scale: Good     Standing balance support: Bilateral upper extremity supported Standing balance-Leahy Scale: Poor Standing balance comment: RW for support                             Pertinent Vitals/Pain Pain Assessment: No/denies pain    Home Living Family/patient expects to be discharged to:: Private residence Living Arrangements: Alone Available Help at Discharge: Other (Comment)(None) Type of Home: House Home Access: Stairs to enter Entrance Stairs-Rails: None Entrance Stairs-Number of Steps: 4 Home Layout: One level Home Equipment: None      Prior Function Level of Independence: Independent               Hand Dominance   Dominant Hand: Right    Extremity/Trunk Assessment   Upper Extremity Assessment Upper Extremity Assessment: Defer to OT evaluation    Lower Extremity Assessment Lower Extremity Assessment: Overall WFL for tasks assessed;LLE deficits/detail LLE Deficits / Details: ACE wrap around foot    Cervical / Trunk Assessment Cervical / Trunk Assessment: Normal  Communication  Communication: No difficulties  Cognition Arousal/Alertness: Awake/alert Behavior During Therapy: Flat affect Overall Cognitive Status: Within Functional Limits for tasks assessed                                        General Comments      Exercises     Assessment/Plan     PT Assessment Patient needs continued PT services  PT Problem List Decreased strength;Decreased activity tolerance;Decreased balance;Decreased mobility;Decreased coordination;Decreased knowledge of use of DME;Decreased safety awareness       PT Treatment Interventions DME instruction;Gait training;Stair training;Functional mobility training;Therapeutic activities;Therapeutic exercise;Balance training;Neuromuscular re-education;Patient/family education    PT Goals (Current goals can be found in the Care Plan section)  Acute Rehab PT Goals Patient Stated Goal: return home PT Goal Formulation: With patient Time For Goal Achievement: 12/09/17 Potential to Achieve Goals: Good    Frequency Min 5X/week   Barriers to discharge Decreased caregiver support      Co-evaluation PT/OT/SLP Co-Evaluation/Treatment: Yes Reason for Co-Treatment: To address functional/ADL transfers;For patient/therapist safety PT goals addressed during session: Mobility/safety with mobility;Balance;Proper use of DME;Strengthening/ROM OT goals addressed during session: ADL's and self-care       AM-PAC PT "6 Clicks" Daily Activity  Outcome Measure Difficulty turning over in bed (including adjusting bedclothes, sheets and blankets)?: None Difficulty moving from lying on back to sitting on the side of the bed? : None Difficulty sitting down on and standing up from a chair with arms (e.g., wheelchair, bedside commode, etc,.)?: Unable Help needed moving to and from a bed to chair (including a wheelchair)?: None Help needed walking in hospital room?: A Little Help needed climbing 3-5 steps with a railing? : A Little 6 Click Score: 19    End of Session Equipment Utilized During Treatment: Gait belt Activity Tolerance: Patient tolerated treatment well Patient left: in chair;with call bell/phone within reach;with chair alarm set Nurse Communication: Mobility status PT Visit Diagnosis: Other abnormalities of gait and  mobility (R26.89)    Time: 9604-5409 PT Time Calculation (min) (ACUTE ONLY): 17 min   Charges:   PT Evaluation $PT Eval Low Complexity: 1 Low     PT G Codes:        [place], PT, DPT [phone]   Mark Graham 11/25/2017, 5:18 PM

## 2017-11-25 NOTE — Evaluation (Signed)
Occupational Therapy Evaluation Patient Details Name: Mark Graham MRN: [id number] DOB: May 15, 1956 Today's Date: 11/25/2017    History of Present Illness Pt is a 62 y.o. male s/p L transmetatarsal amputation. PMHx: DM, HTN.   Clinical Impression   Pt reports he was independent with ADL PTA. Currently pt overall min guard for functional mobility and ADL. Pt able to maintain LLE WBAT through heel only during functional tasks. Pt planning to d/c home alone. Pt would benefit from continued skilled OT to address established goals.    Follow Up Recommendations  No OT follow up;Supervision - Intermittent    Equipment Recommendations  3 in 1 bedside commode    Recommendations for Other Services       Precautions / Restrictions Precautions Precautions: Fall Restrictions Weight Bearing Restrictions: Yes LLE Weight Bearing: Weight bearing as tolerated(through heel only)      Mobility Bed Mobility Overal bed mobility: Needs Assistance Bed Mobility: Supine to Sit     Supine to sit: Supervision     General bed mobility comments: for safety. No c/o dizziness  Transfers Overall transfer level: Needs assistance Equipment used: Rolling walker (2 wheeled) Transfers: Sit to/from Stand Sit to Stand: Min guard         General transfer comment: for safety. Cues for hand plavement with RW. Pt able to maintain WBAT through heel LLE    Balance Overall balance assessment: Needs assistance Sitting-balance support: Feet supported;No upper extremity supported Sitting balance-Leahy Scale: Good     Standing balance support: Bilateral upper extremity supported Standing balance-Leahy Scale: Poor Standing balance comment: RW for support                           ADL either performed or assessed with clinical judgement   ADL Overall ADL's : Needs assistance/impaired Eating/Feeding: Independent;Sitting   Grooming: Set up;Supervision/safety;Sitting   Upper Body Bathing: Set  up;Supervision/ safety;Sitting   Lower Body Bathing: Min guard;Sit to/from stand   Upper Body Dressing : Set up;Supervision/safety;Sitting   Lower Body Dressing: Min guard;Sit to/from stand Lower Body Dressing Details (indicate cue type and reason): Pt able to don sock to R foot Toilet Transfer: Min guard;Ambulation;BSC;RW Toilet Transfer Details (indicate cue type and reason): Simulated by sit to stand from EOB with functional mobility in room         Functional mobility during ADLs: Min guard;Rolling walker General ADL Comments: Pt able to maintain LLE WBAT through heel only during all activities     Vision         Perception     Praxis      Pertinent Vitals/Pain Pain Assessment: No/denies pain     Hand Dominance Right   Extremity/Trunk Assessment Upper Extremity Assessment Upper Extremity Assessment: Overall WFL for tasks assessed   Lower Extremity Assessment Lower Extremity Assessment: Defer to PT evaluation   Cervical / Trunk Assessment Cervical / Trunk Assessment: Normal   Communication Communication Communication: No difficulties   Cognition Arousal/Alertness: Awake/alert Behavior During Therapy: Flat affect Overall Cognitive Status: Within Functional Limits for tasks assessed                                     General Comments       Exercises     Shoulder Instructions      Home Living Family/patient expects to be discharged to:: Private residence Living Arrangements: Alone  Available Help at Discharge: Other (Comment)(None) Type of Home: House Home Access: Stairs to enter Entergy CorporationEntrance Stairs-Number of Steps: 4 Entrance Stairs-Rails: None Home Layout: One level     Bathroom Shower/Tub: Chief Strategy OfficerTub/shower unit   Bathroom Toilet: Standard     Home Equipment: None          Prior Functioning/Environment Level of Independence: Independent                 OT Problem List: Impaired balance (sitting and/or standing);Decreased  knowledge of use of DME or AE;Decreased knowledge of precautions      OT Treatment/Interventions: Self-care/ADL training;DME and/or AE instruction;Therapeutic activities;Patient/family education;Balance training    OT Goals(Current goals can be found in the care plan section) Acute Rehab OT Goals Patient Stated Goal: return home OT Goal Formulation: With patient Time For Goal Achievement: 12/09/17 Potential to Achieve Goals: Good ADL Goals Pt Will Perform Grooming: with supervision;standing Pt Will Perform Lower Body Bathing: with supervision;sit to/from stand Pt Will Perform Lower Body Dressing: with supervision;sit to/from stand Pt Will Transfer to Toilet: with supervision;bedside commode;ambulating Pt Will Perform Tub/Shower Transfer: Tub transfer;with supervision;ambulating;3 in 1;rolling walker  OT Frequency: Min 2X/week   Barriers to D/C: Decreased caregiver support  pt lives alone       Co-evaluation PT/OT/SLP Co-Evaluation/Treatment: Yes Reason for Co-Treatment: For patient/therapist safety   OT goals addressed during session: ADL's and self-care      AM-PAC PT "6 Clicks" Daily Activity     Outcome Measure Help from another person eating meals?: None Help from another person taking care of personal grooming?: A Little Help from another person toileting, which includes using toliet, bedpan, or urinal?: A Little Help from another person bathing (including washing, rinsing, drying)?: A Little Help from another person to put on and taking off regular upper body clothing?: None Help from another person to put on and taking off regular lower body clothing?: A Little 6 Click Score: 20   End of Session Equipment Utilized During Treatment: Gait belt;Rolling walker Nurse Communication: Mobility status;Weight bearing status  Activity Tolerance: Patient tolerated treatment well Patient left: in chair;with call bell/phone within reach;with chair alarm set  OT Visit Diagnosis:  Other abnormalities of gait and mobility (R26.89)                Time: [id number] OT Time Calculation (min): 17 min Charges:  OT General Charges $OT Visit: 1 Visit OT Evaluation $OT Eval Moderate Complexity: 1 Mod G-Codes:     Calvina Liptak A. Brett Albinooffey, M.S., OTR/L Pager: [phone]  Gaye AlkenBailey A Barett Whidbee 11/25/2017, 4:55 PM

## 2017-11-25 NOTE — Anesthesia Procedure Notes (Signed)
Anesthesia Regional Block: Ankle block   Pre-Anesthetic Checklist: ,, timeout performed, Correct Patient, Correct Site, Correct Laterality, Correct Procedure, Correct Position, site marked, Risks and benefits discussed,  Surgical consent,  Pre-op evaluation,  At surgeon's request and post-op pain management  Laterality: Left and Lower  Prep: chloraprep       Needles:   Needle Type: Quincke     Needle Length: 4cm  Needle Gauge: 25     Additional Needles:   (perineural infiltration)  Narrative:  Start time: 11/25/2017 10:35 AM End time: 11/25/2017 10:42 AM Injection made incrementally with aspirations every 5 mL.  Performed by: Personally  Anesthesiologist: Jairo BenJackson, Haydan Mansouri, MD  Additional Notes: Pt identified in Holding room.  Monitors applied. Working IV access confirmed. Sterile prep L ankle.  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  Total 40cc 0.5% Bupivacaine incrementally after negative test dose.  Patient asymptomatic, VSS, no heme aspirated, tolerated well.  Sandford Craze Kolsen Choe, MD

## 2017-11-25 NOTE — Progress Notes (Signed)
   Subjective:  No acute events overnight.  Patient complained of numbness in both left and right feet. Denies pain. Discussed with patient he will go for surgery today. Patient verbalized understanding and is in agreement with plan. All questions answered.   Objective:  Vital signs in last 24 hours: Vitals:   11/24/17 1700 11/24/17 1735 11/24/17 2109 11/25/17 0450  BP: 137/78 (!) 158/84 135/77 134/81  Pulse: 88 83 80 87  Resp: 17 16 18 18   Temp:  98.8 F (37.1 C) 98.1 F (36.7 C) 98.8 F (37.1 C)  TempSrc:  Oral Oral Oral  SpO2: 93% 95% 97% 95%  Weight:  144 lb 2.9 oz (65.4 kg)    Height:  5\' 11"  (1.803 m)     Physical Exam  Constitutional: He is oriented to person, place, and time. He appears well-developed and well-nourished. No distress.  Cardiovascular: Normal rate, regular rhythm, normal heart sounds and intact distal pulses. Exam reveals no gallop and no friction rub.  No murmur heard. Pulmonary/Chest: Breath sounds normal. No respiratory distress. He has no wheezes. He has no rales.  Abdominal: Soft. Bowel sounds are normal. He exhibits no distension.  Neurological: He is alert and oriented to person, place, and time.  Skin: He is not diaphoretic.  All toes on L foot necrotic. Swelling and erythema markedly improved from yesterday. Foul odor.     Assessment/Plan:  Principal Problem:   Frostbite Active Problems:   Cellulitis  # Osteomyelitis of L hallux with overlying non purulent cellulitis in dorsum of L foot: Patient presented with L hallux necrosis and autoamputation of L 2nd-5th toes. His L foot XR showed osteomyelitis of 1st distal tuft. This morning he reports numbness in bilateral LE but no pain. L foot erythema limited to fL foot today (extending up to shins yesterday). Blood cx pending. Unfortunately, he received antibiotics prior to collection. VSS and hemodynamically stable. Plan for L transmetatarsal amputation today. - Ortho following, appreciate  recommendations. Plan for L metatarsal amputation today  - S/p IV Zosyn x1 in the ED  - Ancef x 7 days for cellulitis after blood cx are drawn (1/30-)  - Pain control: Oxy 5 mg q6h PRN + IV Dilaudid 0.5 mg q3h PRN.  - Follow up blood cx   # HTN: On amlodipine 5 mg at home, but has not taken medications for the past 6 months or more. sBP 160-190s on admission.Lisinopril addedd yesterday with improvement in BP. SBP 130-140s. Will plan to increase to 20 tomorrow if remains hypertensive.  - Continue lisinopril 10 mg QD. Plan to increase tomorrow  - Will likely need CM consult prior to discharge to help obtain medications   # T2DM: A1c 7.2 11/2016. On metformin 500 mg BID at home, but states he has not taken any medications for the past 6 months or more.  A1c 7.2 - SSI-S - CBG monitoring   # Tobacco abuse: Current smoker, 1ppd+ - Nicotine patch 14 mg QD   # BPH:  - Continue home Flomax 0.4 mg QD   # Depression # Insomnia  - Continue home Prozac 20 mg QD  - Continue home trazodone 100 mg QHS    Dispo: Anticipated discharge in approximately 2-3 day(s).   Burna CashSantos-Sanchez, Clete Kuch, MD 11/25/2017, 6:38 AM Pager: [phone]

## 2017-11-25 NOTE — Progress Notes (Signed)
Inpatient Diabetes Program Recommendations  AACE/ADA: New Consensus Statement on Inpatient Glycemic Control (2015)  Target Ranges:  Prepandial:   less than 140 mg/dL      Peak postprandial:   less than 180 mg/dL (1-2 hours)      Critically ill patients:  140 - 180 mg/dL   Lab Results  Component Value Date   GLUCAP 114 (H) 11/25/2017   HGBA1C 7.2 (H) 11/25/2017   Review of Glycemic Control  Diabetes history: DM 2 Outpatient Diabetes medications: Metformin 500 mg BID Current orders for Inpatient glycemic control: Novolog Moderate Correction 0-15 units tid  DM consult for pending surgery for patient. Patient's glucose trends prior to admission at goal with A1c of 7.2% on 11/25/17. Glucose levels 114-185 since admission. Watch on current insulin regimen and will follow glucose trends this admission.  Thanks,  Christena DeemShannon Urie Loughner RN, MSN, [facility] Inpatient Diabetes Coordinator Team Pager [phone] (8a-5p)

## 2017-11-25 NOTE — Anesthesia Postprocedure Evaluation (Signed)
Anesthesia Post Note  Patient: Mark Graham  Procedure(s) Performed: TRANSMETATARSAL AMPUTATION (Left Foot)     Patient location during evaluation: PACU Anesthesia Type: Regional and MAC Level of consciousness: awake and alert, patient cooperative and oriented Pain management: pain level controlled Vital Signs Assessment: post-procedure vital signs reviewed and stable Respiratory status: spontaneous breathing, nonlabored ventilation and respiratory function stable Cardiovascular status: blood pressure returned to baseline and stable Postop Assessment: no apparent nausea or vomiting Anesthetic complications: no    Last Vitals:  Vitals:   11/25/17 1300 11/25/17 1328  BP: 134/80 123/81  Pulse: 72 69  Resp: 16 16  Temp: 36.7 C 36.6 C  SpO2: 96% 97%    Last Pain:  Vitals:   11/25/17 1701  TempSrc:   PainSc: 5                  Arnella Pralle,E. Anubis Fundora

## 2017-11-25 NOTE — Interval H&P Note (Signed)
History and Physical Interval Note:  11/25/2017 10:47 AM  Mark Graham  has presented today for surgery, with the diagnosis of left foot osteomylitis  The various methods of treatment have been discussed with the patient and family. After consideration of risks, benefits and other options for treatment, the patient has consented to  Procedure(s): AMPUTATION FOOT (Left) as a surgical intervention .  The patient's history has been reviewed, patient examined, no change in status, stable for surgery.  I have reviewed the patient's chart and labs.  Questions were answered to the patient's satisfaction.     Jayleon Mcfarlane, Gillie MannersKevin P

## 2017-11-25 NOTE — Transfer of Care (Signed)
Immediate Anesthesia Transfer of Care Note  Patient: Mark Graham  Procedure(s) Performed: TRANSMETATARSAL AMPUTATION (Left Foot)  Patient Location: PACU  Anesthesia Type:MAC  Level of Consciousness: awake, alert  and oriented  Airway & Oxygen Therapy: Patient Spontanous Breathing and Patient connected to nasal cannula oxygen  Post-op Assessment: Report given to RN and Post -op Vital signs reviewed and stable  Post vital signs: Reviewed and stable  Last Vitals:  Vitals:   11/25/17 1035 11/25/17 1040  BP: (!) 142/78   Pulse: 77 84  Resp: 17 (!) 9  Temp:    SpO2: 97% 92%    Last Pain:  Vitals:   11/25/17 0450  TempSrc: Oral  PainSc:          Complications: No apparent anesthesia complications

## 2017-11-26 ENCOUNTER — Encounter (HOSPITAL_COMMUNITY): Payer: Self-pay | Admitting: Student

## 2017-11-26 ENCOUNTER — Inpatient Hospital Stay (HOSPITAL_COMMUNITY): Payer: Self-pay

## 2017-11-26 DIAGNOSIS — E119 Type 2 diabetes mellitus without complications: Secondary | ICD-10-CM

## 2017-11-26 DIAGNOSIS — Z89432 Acquired absence of left foot: Secondary | ICD-10-CM

## 2017-11-26 DIAGNOSIS — Z9114 Patient's other noncompliance with medication regimen: Secondary | ICD-10-CM

## 2017-11-26 DIAGNOSIS — F329 Major depressive disorder, single episode, unspecified: Secondary | ICD-10-CM

## 2017-11-26 DIAGNOSIS — Z720 Tobacco use: Secondary | ICD-10-CM

## 2017-11-26 LAB — CBC
HEMATOCRIT: 34.3 % — AB (ref 39.0–52.0)
Hemoglobin: 11.2 g/dL — ABNORMAL LOW (ref 13.0–17.0)
MCH: 31.5 pg (ref 26.0–34.0)
MCHC: 32.7 g/dL (ref 30.0–36.0)
MCV: 96.6 fL (ref 78.0–100.0)
Platelets: 338 10*3/uL (ref 150–400)
RBC: 3.55 MIL/uL — AB (ref 4.22–5.81)
RDW: 14.4 % (ref 11.5–15.5)
WBC: 9 10*3/uL (ref 4.0–10.5)

## 2017-11-26 LAB — BASIC METABOLIC PANEL
Anion gap: 8 (ref 5–15)
BUN: 9 mg/dL (ref 6–20)
CHLORIDE: 101 mmol/L (ref 101–111)
CO2: 26 mmol/L (ref 22–32)
Calcium: 8.4 mg/dL — ABNORMAL LOW (ref 8.9–10.3)
Creatinine, Ser: 1.08 mg/dL (ref 0.61–1.24)
GFR calc Af Amer: >60 mL/min (ref >60)
GLUCOSE: 115 mg/dL — AB (ref 65–99)
POTASSIUM: 3.8 mmol/L (ref 3.5–5.1)
Sodium: 135 mmol/L (ref 135–145)

## 2017-11-26 LAB — GLUCOSE, CAPILLARY
Glucose-Capillary: 102 mg/dL — ABNORMAL HIGH (ref 65–99)
Glucose-Capillary: 165 mg/dL — ABNORMAL HIGH (ref 65–99)
Glucose-Capillary: 121 mg/dL — ABNORMAL HIGH (ref 65–99)
Glucose-Capillary: 96 mg/dL (ref 65–99)

## 2017-11-26 MED ORDER — ENOXAPARIN SODIUM 40 MG/0.4ML ~~LOC~~ SOLN
40.0000 mg | SUBCUTANEOUS | Status: DC
  Administered 2017-11-26 – 2017-11-30 (×5): 40 mg via SUBCUTANEOUS
  Filled 2017-11-26 (×5): qty 0.4

## 2017-11-26 MED ORDER — HYDROMORPHONE HCL 1 MG/ML IJ SOLN
1.0000 mg | INTRAMUSCULAR | Status: DC | PRN
  Administered 2017-11-26 – 2017-11-27 (×5): 1 mg via INTRAVENOUS
  Filled 2017-11-26 (×5): qty 1

## 2017-11-26 MED ORDER — OXYCODONE HCL 5 MG PO TABS
5.0000 mg | ORAL_TABLET | ORAL | Status: DC | PRN
  Administered 2017-11-26 – 2017-11-29 (×12): 5 mg via ORAL
  Filled 2017-11-26 (×12): qty 1

## 2017-11-26 NOTE — Progress Notes (Signed)
CSW received consult for SNF placement. CSW to ask CSW AD to review case.  Osborne Cascoadia Dajai Wahlert LCSW [phone]

## 2017-11-26 NOTE — Progress Notes (Signed)
Occupational Therapy Treatment Patient Details Name: Mark Graham MRN: [id number] DOB: 10-29-1955 Today's Date: 11/26/2017    History of present illness Pt is a 62 y.o. male s/p L transmetatarsal amputation. PMHx: DM, HTN.   OT comments  Mobility limited by pain, but able to complete ADL with supervision to min guard assist.  Follow Up Recommendations  No OT follow up;Supervision - Intermittent    Equipment Recommendations  3 in 1 bedside commode    Recommendations for Other Services      Precautions / Restrictions Precautions Precautions: Fall Restrictions Weight Bearing Restrictions: Yes LLE Weight Bearing: Weight bearing as tolerated(in post op shoe)       Mobility Bed Mobility Overal bed mobility: Modified Independent Bed Mobility: Supine to Sit     Supine to sit: Supervision     General bed mobility comments: HOB up, no help  Transfers Overall transfer level: Needs assistance Equipment used: Rolling walker (2 wheeled) Transfers: Sit to/from Stand Sit to Stand: Supervision         General transfer comment: cues for hand placement, supervision for safety    Balance Overall balance assessment: Needs assistance   Sitting balance-Leahy Scale: Good     Standing balance support: Bilateral upper extremity supported Standing balance-Leahy Scale: Fair Standing balance comment: can release walker in static standing at sink                           ADL either performed or assessed with clinical judgement   ADL Overall ADL's : Needs assistance/impaired     Grooming: Wash/dry hands;Standing;Supervision/safety           Upper Body Dressing : Set up;Supervision/safety;Sitting   Lower Body Dressing: Supervision/safety;Sit to/from stand Lower Body Dressing Details (indicate cue type and reason): Pt able to don sock to R foot and post op shoe to L Toilet Transfer: Min guard;Ambulation;BSC;RW   Toileting- Clothing Manipulation and Hygiene:  Supervision/safety;Sit to/from stand       Functional mobility during ADLs: Min guard;Rolling walker General ADL Comments: Pt placing weight through hands and R foot primarily.     Vision       Perception     Praxis      Cognition Arousal/Alertness: Awake/alert Behavior During Therapy: Flat affect Overall Cognitive Status: Within Functional Limits for tasks assessed                                          Exercises     Shoulder Instructions       General Comments      Pertinent Vitals/ Pain       Pain Assessment: Faces Pain Score: 8  Faces Pain Scale: Hurts whole lot Pain Location: L foot Pain Descriptors / Indicators: Throbbing;Grimacing;Moaning;Guarding Pain Intervention(s): Monitored during session;Repositioned  Home Living                                          Prior Functioning/Environment              Frequency  Min 2X/week        Progress Toward Goals  OT Goals(current goals can now be found in the care plan section)  Progress towards OT goals: Progressing toward goals  Acute Rehab OT  Goals Patient Stated Goal: return home OT Goal Formulation: With patient Time For Goal Achievement: 12/09/17 Potential to Achieve Goals: Good  Plan Discharge plan remains appropriate    Co-evaluation                 AM-PAC PT "6 Clicks" Daily Activity     Outcome Measure   Help from another person eating meals?: None Help from another person taking care of personal grooming?: A Little Help from another person toileting, which includes using toliet, bedpan, or urinal?: A Little Help from another person bathing (including washing, rinsing, drying)?: A Little Help from another person to put on and taking off regular upper body clothing?: None Help from another person to put on and taking off regular lower body clothing?: A Little 6 Click Score: 20    End of Session Equipment Utilized During Treatment: Gait  belt;Rolling walker  OT Visit Diagnosis: Other abnormalities of gait and mobility (R26.89)   Activity Tolerance Patient limited by pain   Patient Left in bed;with call bell/phone within reach   Nurse Communication          Time: [id number] OT Time Calculation (min): 30 min  Charges: OT General Charges $OT Visit: 1 Visit OT Treatments $Self Care/Home Management : 23-37 mins  11/26/2017 Martie RoundJulie Iriana Artley, OTR/L Pager: [phone] Evern BioMayberry, Jetaime Pinnix Lynn 11/26/2017, 4:08 PM

## 2017-11-26 NOTE — Progress Notes (Signed)
Physical Therapy Treatment Patient Details Name: Mark Graham MRN: [id number] DOB: Nov 02, 1955 Today's Date: 11/26/2017    History of Present Illness Pt is a 62 y.o. male s/p L transmetatarsal amputation. PMHx: DM, HTN.    PT Comments    Pt needed encouragement to participate today due to the pain in his left foot.  Emphasis on gait training and plan also for stair training, but pt too painful and deferred stairs.    Follow Up Recommendations  Home health PT;Supervision - Intermittent     Equipment Recommendations  Rolling walker with 5" wheels    Recommendations for Other Services       Precautions / Restrictions Precautions Precautions: Fall Restrictions LLE Weight Bearing: Weight bearing as tolerated    Mobility  Bed Mobility Overal bed mobility: Needs Assistance Bed Mobility: Supine to Sit     Supine to sit: Supervision     General bed mobility comments: supervision for safety  Transfers Overall transfer level: Needs assistance Equipment used: Rolling walker (2 wheeled) Transfers: Sit to/from Stand Sit to Stand: Min guard         General transfer comment: increased time and effort, but no need for assist  Ambulation/Gait Ambulation/Gait assistance: Min guard Ambulation Distance (Feet): 45 Feet Assistive device: Rolling walker (2 wheeled) Gait Pattern/deviations: Step-to pattern Gait velocity: decreased Gait velocity interpretation: Below normal speed for age/gender General Gait Details: pt not able to put weight onto the heel today, but able to " swing to " on the R LE smoothly.   Stairs            Wheelchair Mobility    Modified Rankin (Stroke Patients Only)       Balance Overall balance assessment: Needs assistance   Sitting balance-Leahy Scale: Good     Standing balance support: Bilateral upper extremity supported Standing balance-Leahy Scale: Poor Standing balance comment: RW for support                             Cognition Arousal/Alertness: Awake/alert Behavior During Therapy: Flat affect Overall Cognitive Status: Within Functional Limits for tasks assessed                                        Exercises      General Comments        Pertinent Vitals/Pain Pain Assessment: 0-10 Pain Score: 8  Pain Location: L foot Pain Descriptors / Indicators: Throbbing;Grimacing Pain Intervention(s): Monitored during session    Home Living                      Prior Function            PT Goals (current goals can now be found in the care plan section) Acute Rehab PT Goals Patient Stated Goal: return home PT Goal Formulation: With patient Time For Goal Achievement: 12/09/17 Potential to Achieve Goals: Good Progress towards PT goals: Progressing toward goals    Frequency    Min 5X/week      PT Plan Current plan remains appropriate    Co-evaluation              AM-PAC PT "6 Clicks" Daily Activity  Outcome Measure  Difficulty turning over in bed (including adjusting bedclothes, sheets and blankets)?: None Difficulty moving from lying on back to sitting on the side  of the bed? : None Difficulty sitting down on and standing up from a chair with arms (e.g., wheelchair, bedside commode, etc,.)?: A Little Help needed moving to and from a bed to chair (including a wheelchair)?: None Help needed walking in hospital room?: A Little Help needed climbing 3-5 steps with a railing? : A Little 6 Click Score: 21    End of Session   Activity Tolerance: Patient tolerated treatment well Patient left: in bed;with call bell/phone within reach Nurse Communication: Mobility status PT Visit Diagnosis: Other abnormalities of gait and mobility (R26.89)     Time: [id number] PT Time Calculation (min) (ACUTE ONLY): 28 min  Charges:  $Gait Training: 8-22 mins $Therapeutic Activity: 8-22 mins                    G Codes:       11/26/2017  Mark BingKen Phelix Graham,  PT [phone]  (pager)   Mark GumKenneth V Harlan Graham 11/26/2017, 2:15 PM

## 2017-11-26 NOTE — Progress Notes (Signed)
Orthopaedic Trauma Progress Note  S: Patient complaining of pain in his leg.  States that he feels that the wrap is too tight.  O:  Vitals:   11/26/17 0436 11/26/17 1009  BP: 123/89 133/76  Pulse: (!) 115 (!) 105  Resp: 18 18  Temp: 99.3 F (37.4 C) 98.4 F (36.9 C)  SpO2: 94% 94%   General: No acute distress, awake alert and oriented x3 cooperative and pleasant Left lower extremity: Ace wrap that was in place was loosened.  Otherwise clean dry and intact.  Notable tenderness along the incision line  Labs:  CBC    Component Value Date/Time   WBC 9.0 11/26/2017 0230   RBC 3.55 (L) 11/26/2017 0230   HGB 11.2 (L) 11/26/2017 0230   HCT 34.3 (L) 11/26/2017 0230   PLT 338 11/26/2017 0230   MCV 96.6 11/26/2017 0230   MCH 31.5 11/26/2017 0230   MCHC 32.7 11/26/2017 0230   RDW 14.4 11/26/2017 0230   LYMPHSABS 1.8 11/24/2017 0927   MONOABS 0.8 11/24/2017 0927   EOSABS 0.1 11/24/2017 0927   BASOSABS 0.1 11/24/2017 [id number]    A/P: 62 year old male with left gangrenous necrotic toes status post transmetatarsal amputation  -Plan for weightbearing as tolerated through the heel in a postop shoe will order today. -Will change dressing tomorrow- -if wound appears good will recommend discharged home with outpatient follow-up in approximately 2 weeks.  Roby LoftsKevin P. Romon Devereux, MD [facility] [phone] (phone)

## 2017-11-26 NOTE — Progress Notes (Signed)
Subjective:  [name] was seen lying in bed comfortably this morning. He endorses throbbing in his left lower extremity and he feels that his right foot is cold. He states that he thinks the bandage is wrapped too tight on his LLE. Loosened by Ortho this AM. He denies fevers, chills, chest pain, or shortness of breath. He states that he lives on [address] and when asked about whether he would be able to have home health services come to his house, he stated that he doesn't want them to go to his house because it's a mess.  Objective:  Vital signs in last 24 hours: Vitals:   11/25/17 1328 11/25/17 2256 11/26/17 0436 11/26/17 1009  BP: 123/81 (!) 172/98 123/89 133/76  Pulse: 69 (!) 110 (!) 115 (!) 105  Resp: 16 18 18 18   Temp: 97.8 F (36.6 C) 98.1 F (36.7 C) 99.3 F (37.4 C) 98.4 F (36.9 C)  TempSrc: Oral   Oral  SpO2: 97% 95% 94% 94%  Weight:      Height:       GEN: Well-appearing, well-nourished. Alert and oriented. No acute distress.  RESP: Clear to auscultation bilaterally. No wheezes, rales, or rhonchi. No increased work of breathing. CV: Normal rate and regular rhythm. No murmurs, gallops, or rubs. No LE edema. EXT: No edema. Warm. L foot bandaged and ACE wrap is clean, dry, and intact. No TTP of BLE. RLE with 2+ DP pulse, feels warm and without evidence of lesions or ulcers. NEURO: Cranial nerves II-XII grossly intact. Able to lift all four extremities against gravity. No apparent audiovisual hallucinations. Speech fluent and appropriate. PSYCH: Patient is calm and pleasant. Appropriate affect. Well-groomed; speech is appropriate and on-subject.  Assessment/Plan:  Principal Problem:   S/P transmetatarsal amputation of foot, left (HCC) Active Problems:   Frostbite   Cellulitis  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 Found to have gangrene  of all toes on left foot with osteomyelitis of 1st metatarsal on x-ray with overlying cellulitis secondary to frostbite from being in the snow 6 weeks ago, now s/p L transmetatarsal amputation 1/31.  Osteomyelitis of L halluxwith overlying non purulent cellulitis in dorsum of L foot, s/p L transmetatarsal amputation 1/31 and 2 days of cephalexin. Reports throbbing in his LLE, most likely post-op pain. BCx pending (received antibiotics prior to collection). Stable vital signs and afebrile. Normal WBC at 9.0. Received 1 dose of cephalexin post-op. PT recommending HH PT, no OT follow-up needed, however we will need to clarify with the patient regarding his ability to receive home health services. - Ortho following, appreciate recommendations - Dressing change tomorrow - Discontinue cephalexin, given source control has been achieved - F/u BCx - Oxy IR 5mg  q4h PRN + IV Dilaudid 1mg  q4h PRN with hold parameters for pain - Narcan PRN - Start Lovenox 40mg  daily for DVT PPx at 6pm on 2/1 - Continue PT/OT eval - F/u with Ortho outpatient in ~2 weeks  HTN Home regimen includes amlodipine 5mg  daily, but he has not taken medications for the past 6 months or more. BP normal at 123/89 after addition of lisinopril. -Continue lisinopril 10mg  qday, can consider increasing to 20mg  daily if BP elevated - Will likely need CM consult prior to discharge to help obtain medications  T2DM A1c 7.2 in 11/2016. On metformin 500mg  BID at home,  but states he has not taken any medications for the past 6 months or more. BG 115-165. - SSI-S - CBG monitoring  Tobacco use disorder Current smoker, 1ppd+ - Nicotine patch 14mg  qday  BPH Home regimen includes tamsulosin 0.8mg  daily and finasteride 5mg  daily. Unsure if patient was taking these medications.  Depression Insomnia Home regimen includes trazodone 100mg  QHS PRN and fluoxetine 20mg  dailyUnsure if patient was taking these medications.  Dispo: Anticipated  discharge in approximately 1-2 day(s).   Scherrie Gerlach, MD 11/26/2017, 12:48 PM Pager: Demetrius Charity [phone]

## 2017-11-27 ENCOUNTER — Inpatient Hospital Stay (HOSPITAL_COMMUNITY): Payer: Self-pay

## 2017-11-27 DIAGNOSIS — Z89432 Acquired absence of left foot: Secondary | ICD-10-CM

## 2017-11-27 DIAGNOSIS — B9689 Other specified bacterial agents as the cause of diseases classified elsewhere: Secondary | ICD-10-CM

## 2017-11-27 DIAGNOSIS — L03032 Cellulitis of left toe: Secondary | ICD-10-CM

## 2017-11-27 LAB — BLOOD CULTURE ID PANEL (REFLEXED)
Acinetobacter baumannii: NOT DETECTED
CANDIDA GLABRATA: NOT DETECTED
CANDIDA TROPICALIS: NOT DETECTED
Candida albicans: NOT DETECTED
Candida krusei: NOT DETECTED
Candida parapsilosis: NOT DETECTED
Enterobacter cloacae complex: NOT DETECTED
Enterobacteriaceae species: NOT DETECTED
Enterococcus species: NOT DETECTED
Escherichia coli: NOT DETECTED
Haemophilus influenzae: NOT DETECTED
KLEBSIELLA PNEUMONIAE: NOT DETECTED
Klebsiella oxytoca: NOT DETECTED
Listeria monocytogenes: NOT DETECTED
NEISSERIA MENINGITIDIS: NOT DETECTED
PROTEUS SPECIES: NOT DETECTED
Pseudomonas aeruginosa: NOT DETECTED
STAPHYLOCOCCUS SPECIES: NOT DETECTED
Serratia marcescens: NOT DETECTED
Staphylococcus aureus (BCID): NOT DETECTED
Streptococcus agalactiae: NOT DETECTED
Streptococcus pneumoniae: NOT DETECTED
Streptococcus pyogenes: NOT DETECTED
Streptococcus species: NOT DETECTED

## 2017-11-27 LAB — CBC
HEMATOCRIT: 31.1 % — AB (ref 39.0–52.0)
HEMOGLOBIN: 10.5 g/dL — AB (ref 13.0–17.0)
MCH: 32.6 pg (ref 26.0–34.0)
MCHC: 33.8 g/dL (ref 30.0–36.0)
MCV: 96.6 fL (ref 78.0–100.0)
Platelets: 288 10*3/uL (ref 150–400)
RBC: 3.22 MIL/uL — AB (ref 4.22–5.81)
RDW: 14.5 % (ref 11.5–15.5)
WBC: 6.8 10*3/uL (ref 4.0–10.5)

## 2017-11-27 LAB — GLUCOSE, CAPILLARY
GLUCOSE-CAPILLARY: 144 mg/dL — AB (ref 65–99)
GLUCOSE-CAPILLARY: 127 mg/dL — AB (ref 65–99)
Glucose-Capillary: 93 mg/dL (ref 65–99)
Glucose-Capillary: 119 mg/dL — ABNORMAL HIGH (ref 65–99)

## 2017-11-27 MED ORDER — HYDROMORPHONE HCL 1 MG/ML IJ SOLN: 1.0000 mg | INTRAMUSCULAR | Status: DC | PRN

## 2017-11-27 MED ORDER — LACTULOSE 10 GM/15ML PO SOLN
20.0000 g | Freq: Every day | ORAL | Status: DC | PRN
  Administered 2017-11-27 – 2017-11-28 (×2): 20 g via ORAL
  Filled 2017-11-27 (×2): qty 30

## 2017-11-27 MED ORDER — DEXTROSE 5 % IV SOLN
2.0000 g | INTRAVENOUS | Status: DC
  Administered 2017-11-27 – 2017-11-28 (×2): 2 g via INTRAVENOUS
  Filled 2017-11-27 (×3): qty 2

## 2017-11-27 NOTE — Progress Notes (Signed)
   Subjective: Doing well this morning, sitting up in bed eating breakfast. Mood is elevated today, very talkative. Overnight patient was febrile to 101.241F and 1/2 blood cultures resulted positive for gram negative rods, BCID is negative. Ceftriaxone was started.   Objective:  Vital signs in last 24 hours: Vitals:   11/26/17 1500 11/26/17 2106 11/27/17 0447 11/27/17 0919  BP:  120/62 116/90 125/83  Pulse:  88 97   Resp:  18 18   Temp: 99.5 F (37.5 C) (!) 100.6 F (38.1 C) 98.1 F (36.7 C)   TempSrc: Oral Oral Oral   SpO2:  97% 92%   Weight:      Height:       GEN: Well-appearing, well-nourished. Alert and oriented. No acute distress.  RESP: Clear to auscultation bilaterally. No wheezes, rales, or rhonchi. No increased work of breathing. CV: Normal rate and regular rhythm. No murmurs, gallops, or rubs. No LE edema. EXT: No edema. Warm. L foot bandaged and ACE wrap is clean, dry, and intact. No TTP of BLE. RLE with 2+ DP pulse, feels warm and without evidence of lesions or ulcers. NEURO: Cranial nerves II-XII grossly intact. Able to lift all four extremities against gravity. No apparent audiovisual hallucinations. Speech fluent and appropriate. PSYCH: Patient is calm and pleasant. Mood is elevated, talkative.   Assessment/Plan:  Mr. Margo AyeHall is a 62yo male with PMH of T2DM, HTN, and tobacco use disorder who presents with blackening of the toes on his left foot and autoamputation of L 2nd-5th toes. 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  Gangrene of all toes w/ osteomyelitis of 1st metatarsal: Secondary to frostbite from snow 6 weeks ago. S/p L transmetatarsal amputation 1/31 and 2 days of cephalexin, subsequently discontinued. Unfortunately patient developed fever overnight T 101.241F with 1/2 blood cultures  positive for gram positive rods, BCID negative. Ceftriaxone was started. Surgical cultures were not obtained. CBC today is without leukocytosis.  -- Continue Ceftriaxone for now -- F/u blood cultures speciation, possible contaminant given 1/2, although did have true fever overnight  -- Consider repeat cultures tomorrow to ensure eradication  -- Follow fever, wbc curve  -- Post op pain control with Dilaudid and oxy; will start to wean dilaudid, decreased 1 mg q3h -> q5h, continue oxy 5mg  q4h prn for breakthrough  -- Narcan prn  -- PT/OT  -- F/u ortho outpatient ~ 2 weeks   HTN Home regimen includes amlodipine 5mg  daily, but he has not taken medications for the past 6 months or more. BP normal at 123/89 after addition of lisinopril. -Continuelisinopril 10mg  qday, can consider increasing to 20mg  daily if BP elevated - Will likely need CM consult prior to discharge to help obtain medications  T2DM A1c 7.2 in 11/2016. On metformin 500mg  BID at home, but states he has not taken any medications for the past 6 months or more. BG 115-165. - SSI-S - CBG monitoring  Tobacco use disorder Current smoker, 1ppd+ - Nicotine patch 14mg  qday  BPH Home regimen includes tamsulosin 0.8mg  daily and finasteride 5mg  daily. Unsure if patient was taking these medications.  Depression Insomnia Home regimen includes trazodone 100mg  QHS PRN and fluoxetine 20mg  dailyUnsure if patient was taking these medications.  Dispo: Anticipated discharge in approximately 1-2 day(s).   Reymundo PollGuilloud, Green [place], MD 11/27/2017, 10:34 AM Pager: [phone]

## 2017-11-27 NOTE — Progress Notes (Signed)
Physical Therapy Treatment Patient Details Name: Mark Graham MRN: [id number] DOB: 11/15/1955 Today's Date: 11/27/2017    History of Present Illness Pt is a 62 y.o. male s/p L transmetatarsal amputation. PMHx: DM, HTN.    PT Comments    Even with pain meds pt unable to bear any weight on his L LE.  Emphasis today on transfer safety, gait technique/training and stair training.    Follow Up Recommendations  Home health PT;Supervision - Intermittent     Equipment Recommendations  Rolling walker with 5" wheels    Recommendations for Other Services       Precautions / Restrictions Precautions Precautions: Fall Restrictions LLE Weight Bearing: Weight bearing as tolerated    Mobility  Bed Mobility Overal bed mobility: Modified Independent Bed Mobility: Supine to Sit           General bed mobility comments: HOB up, no help  Transfers Overall transfer level: Needs assistance Equipment used: Rolling walker (2 wheeled) Transfers: Sit to/from Stand Sit to Stand: Supervision         General transfer comment: cues for hand placement, supervision for safety.  Pt wants to push up with both hands from RW  Ambulation/Gait Ambulation/Gait assistance: Min guard Ambulation Distance (Feet): 15 Feet(x2) Assistive device: Rolling walker (2 wheeled) Gait Pattern/deviations: Step-to pattern Gait velocity: decreased Gait velocity interpretation: Below normal speed for age/gender General Gait Details: pt not able to put weight onto the heel today, but able to " swing to " on the R LE smoothly.   Stairs Stairs: Yes   Stair Management: Two rails;Step to pattern Number of Stairs: 3 General stair comments: pt "hopped" up the steps on R LE without putting weight on the L LE, but has no rails at home.  Wheelchair Mobility    Modified Rankin (Stroke Patients Only)       Balance Overall balance assessment: Needs assistance   Sitting balance-Leahy Scale: Good     Standing  balance support: Bilateral upper extremity supported Standing balance-Leahy Scale: Fair Standing balance comment: can release walker in static standing at sink                            Cognition Arousal/Alertness: Awake/alert Behavior During Therapy: WFL for tasks assessed/performed Overall Cognitive Status: Within Functional Limits for tasks assessed                                        Exercises      General Comments        Pertinent Vitals/Pain Pain Assessment: Faces Faces Pain Scale: Hurts even more Pain Location: L foot Pain Descriptors / Indicators: Throbbing;Grimacing;Moaning;Guarding Pain Intervention(s): Monitored during session    Home Living                      Prior Function            PT Goals (current goals can now be found in the care plan section) Acute Rehab PT Goals Patient Stated Goal: return home PT Goal Formulation: With patient Time For Goal Achievement: 12/09/17 Potential to Achieve Goals: Good Progress towards PT goals: Progressing toward goals    Frequency    Min 5X/week      PT Plan Current plan remains appropriate    Co-evaluation  AM-PAC PT "6 Clicks" Daily Activity  Outcome Measure  Difficulty turning over in bed (including adjusting bedclothes, sheets and blankets)?: None Difficulty moving from lying on back to sitting on the side of the bed? : None Difficulty sitting down on and standing up from a chair with arms (e.g., wheelchair, bedside commode, etc,.)?: A Little Help needed moving to and from a bed to chair (including a wheelchair)?: None Help needed walking in hospital room?: A Little Help needed climbing 3-5 steps with a railing? : A Little 6 Click Score: 21    End of Session   Activity Tolerance: Patient tolerated treatment well Patient left: in bed;with call bell/phone within reach Nurse Communication: Mobility status PT Visit Diagnosis: Other  abnormalities of gait and mobility (R26.89)     Time: [id number] PT Time Calculation (min) (ACUTE ONLY): 23 min  Charges:  $Gait Training: 8-22 mins $Therapeutic Activity: 8-22 mins                    G Codes:       11/27/2017  Mark Graham, PT [phone]  (pager)   Mark Graham 11/27/2017, 3:47 PM

## 2017-11-27 NOTE — Progress Notes (Signed)
Orthopaedic Progress Note  S: Discomfort improving.  Eating, drinking, voiding.  Early mobilization with therapy.    O:  Vitals:   11/27/17 0447 11/27/17 0919  BP: 116/90 125/83  Pulse: 97   Resp: 18   Temp: 98.1 F (36.7 C)   SpO2: 92%    General: NAD.  Upright in bed.  Calm, talkative.  No increased work of breathing.   LLE:: All dressings taken down to incision which is intact with suture.  Mild serosanguineous drainage.  No active bleeding.  No purulence or odor.  New Adaptic, gauze, ABD, Kerlix, and Ace wrap were applied.   Labs:  CBC    Component Value Date/Time   WBC 6.8 11/27/2017 0704   RBC 3.22 (L) 11/27/2017 0704   HGB 10.5 (L) 11/27/2017 0704   HCT 31.1 (L) 11/27/2017 0704   PLT 288 11/27/2017 0704   MCV 96.6 11/27/2017 0704   MCH 32.6 11/27/2017 0704   MCHC 33.8 11/27/2017 0704   RDW 14.5 11/27/2017 0704   LYMPHSABS 1.8 11/24/2017 0927   MONOABS 0.8 11/24/2017 0927   EOSABS 0.1 11/24/2017 0927   BASOSABS 0.1 11/24/2017 0927    A/P:  62 year old male with left gangrenous necrotic toes status post transmetatarsal amputation by Dr. Jena GaussHaddix 11/25/2017.  -WBAT LLE through heel in postop shoe or boot.  He has a postop shoe.  Boot ordered if needed to mobilize. -Smoking cessation and glucose control discussed and recommended to facilitate healing and reduce the risk of infection and or wound breakdown.  Stable from an orthopedic perspective.  Plan for follow-up with Dr. Jena GaussHaddix in about 2 weeks.

## 2017-11-27 NOTE — Progress Notes (Signed)
VASCULAR LAB PRELIMINARY  ARTERIAL  ABI completed:   RIGHT    LEFT    PRESSURE WAVEFORM  PRESSURE WAVEFORM  BRACHIAL 127 Triphasic BRACHIAL 123 Triphasic  DP 135 Triphasic DP 150 Monophasic  AT   AT    PT 138 Triphasic PT 139 Triphasic  PER   PER    GREAT TOE  NA GREAT TOE  NA    RIGHT LEFT  ABI 1.09 1.18   Bilateral ABIs are within normal limits at rest.  11/27/2017 1:20 PM Gertie FeyMichelle Ashwath Lasch, BS, RVT, RDCS, RDMS

## 2017-11-27 NOTE — Progress Notes (Signed)
PHARMACY - PHYSICIAN COMMUNICATION CRITICAL VALUE ALERT - BLOOD CULTURE IDENTIFICATION (BCID)  Mark Graham is an 62 y.o. male who [facility] with a chief complaint of L foot ischemia/osteomyelitis s/p transmetatarsal amputation  Assessment:  1/2 Blood Cultures from 1/30 growing Gram Positive Rods  Name of physician (or Provider) Contacted:  Dr. Allena KatzPatel  Current antibiotics: None  Changes to prescribed antibiotics recommended:   Consider adding Rocephin 2 g IV q24h  Results for orders placed or performed during the hospital encounter of 11/24/17  Blood Culture ID Panel (Reflexed) (Collected: 11/24/2017  3:10 PM)  Result Value Ref Range   Enterococcus species NOT DETECTED NOT DETECTED   Listeria monocytogenes NOT DETECTED NOT DETECTED   Staphylococcus species NOT DETECTED NOT DETECTED   Staphylococcus aureus NOT DETECTED NOT DETECTED   Streptococcus species NOT DETECTED NOT DETECTED   Streptococcus agalactiae NOT DETECTED NOT DETECTED   Streptococcus pneumoniae NOT DETECTED NOT DETECTED   Streptococcus pyogenes NOT DETECTED NOT DETECTED   Acinetobacter baumannii NOT DETECTED NOT DETECTED   Enterobacteriaceae species NOT DETECTED NOT DETECTED   Enterobacter cloacae complex NOT DETECTED NOT DETECTED   Escherichia coli NOT DETECTED NOT DETECTED   Klebsiella oxytoca NOT DETECTED NOT DETECTED   Klebsiella pneumoniae NOT DETECTED NOT DETECTED   Proteus species NOT DETECTED NOT DETECTED   Serratia marcescens NOT DETECTED NOT DETECTED   Haemophilus influenzae NOT DETECTED NOT DETECTED   Neisseria meningitidis NOT DETECTED NOT DETECTED   Pseudomonas aeruginosa NOT DETECTED NOT DETECTED   Candida albicans NOT DETECTED NOT DETECTED   Candida glabrata NOT DETECTED NOT DETECTED   Candida krusei NOT DETECTED NOT DETECTED   Candida parapsilosis NOT DETECTED NOT DETECTED   Candida tropicalis NOT DETECTED NOT DETECTED    Mark Graham, [name] 11/27/2017  12:50 AM

## 2017-11-28 DIAGNOSIS — Z89422 Acquired absence of other left toe(s): Secondary | ICD-10-CM

## 2017-11-28 DIAGNOSIS — F339 Major depressive disorder, recurrent, unspecified: Secondary | ICD-10-CM

## 2017-11-28 DIAGNOSIS — R4789 Other speech disturbances: Secondary | ICD-10-CM

## 2017-11-28 DIAGNOSIS — F1099 Alcohol use, unspecified with unspecified alcohol-induced disorder: Secondary | ICD-10-CM

## 2017-11-28 DIAGNOSIS — Z598 Other problems related to housing and economic circumstances: Secondary | ICD-10-CM

## 2017-11-28 DIAGNOSIS — F5104 Psychophysiologic insomnia: Secondary | ICD-10-CM

## 2017-11-28 DIAGNOSIS — F17210 Nicotine dependence, cigarettes, uncomplicated: Secondary | ICD-10-CM

## 2017-11-28 LAB — CBC
HCT: 31.7 % — ABNORMAL LOW (ref 39.0–52.0)
Hemoglobin: 10.6 g/dL — ABNORMAL LOW (ref 13.0–17.0)
MCH: 32.1 pg (ref 26.0–34.0)
MCHC: 33.4 g/dL (ref 30.0–36.0)
MCV: 96.1 fL (ref 78.0–100.0)
Platelets: 304 10*3/uL (ref 150–400)
RBC: 3.3 MIL/uL — ABNORMAL LOW (ref 4.22–5.81)
RDW: 14.5 % (ref 11.5–15.5)
WBC: 4.4 10*3/uL (ref 4.0–10.5)

## 2017-11-28 LAB — CULTURE, BLOOD (ROUTINE X 2): Special Requests: ADEQUATE

## 2017-11-28 LAB — GLUCOSE, CAPILLARY
Glucose-Capillary: 101 mg/dL — ABNORMAL HIGH (ref 65–99)
Glucose-Capillary: 134 mg/dL — ABNORMAL HIGH (ref 65–99)
Glucose-Capillary: 106 mg/dL — ABNORMAL HIGH (ref 65–99)

## 2017-11-28 MED ORDER — QUETIAPINE FUMARATE 25 MG PO TABS
50.0000 mg | ORAL_TABLET | Freq: Every day | ORAL | Status: DC
  Administered 2017-11-28 – 2017-11-30 (×3): 50 mg via ORAL
  Filled 2017-11-28 (×3): qty 2

## 2017-11-28 NOTE — Progress Notes (Signed)
Subjective:  Mark Graham was seen sitting up in bed this morning. He states he is looking forward to getting his breakfast. Mood very elevated with some nonsensical speech. Very talkative (different from his baseline on admission). Denies auditory or visual hallucinations. Temp 100.6 overnight. Patient denies chest pain, shortness of breath, or fevers/chills. Persistent pain in his LLE.  Objective:  Vital signs in last 24 hours: Vitals:   11/27/17 0919 11/27/17 1512 11/27/17 2143 11/28/17 0542  BP: 125/83 135/80 (!) 173/98 (!) 150/82  Pulse:  (!) 105 (!) 111 83  Resp:  18 18 18   Temp:  99.7 F (37.6 C) (!) 100.6 F (38.1 C) 98 F (36.7 C)  TempSrc:  Oral  Oral  SpO2:  98% 96% 99%  Weight:      Height:       GEN: Well-appearing, well-nourished. Alert and oriented x3. No acute distress. RESP: Clear to auscultation bilaterally. No wheezes, rales, or rhonchi. No increased work of breathing. CV: Normal rate and regular rhythm. No murmurs, gallops, or rubs. No LE edema. EXT: No edema. Warm. L foot bandaged and ACE wrap is clean, dry, and intact. No TTP of BLE. RLE with 2+ DP pulse, feels warm and without evidence of lesions or ulcers. NEURO: Cranial nerves II-XII grossly intact. Able to lift all four extremities against gravity. No apparent audiovisual hallucinations. Speech fluent and nonsensical at times. PSYCH: Patient is calm and pleasant. Mood is elevated, talkative. Speech fluent, not pressured, but nonsensical at times. Answers questions appropriately but responds with tangential thoughts.  Assessment/Plan:  Mark Graham is a 62yo male with PMH of T2DM, HTN, and tobacco use disorder who presents with blackening of the toes on his left foot and autoamputation of L 2nd-5th toes. Found to have gangrene of all toes on left foot with osteomyelitis of 1st metatarsal on x-ray with overlying cellulitis secondary to frostbite from being in the snow 6 weeks ago, now s/p L transmetatarsal amputation  1/31.  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 S/p L transmetatarsal amputation 1/31 and 2 days of cephalexin, subsequently discontinued On ceftriaxone due to 1/2 BCx positive for gram positive rods, BCID negative. Surgical cultures were not obtained. CBC without leukocytosis. Temp 100.6 overnight, afebrile this morning with stable vital signs. Patient denies chest pain, shortness of breath, or fevers/chills. - Continue ceftriaxone - Repeat BCx today to follow for eradication - Continue to monitor fever curve - Continue weaning pain medication today, will dc IV dilaudid - Continue PO oxy 5mg  q4h PRN - Narcan prn - PT/OT - F/u ortho outpatient ~ 2 weeks  HTN Home regimen includes amlodipine 5mg  daily, but he has not taken medications for the past 6 months or more. BP 150/82. Started lisinopril this admission. -Continuelisinopril 10mg  qday, can consider increasing to 20mg  daily if BP remains elevated - Will likely need CM consult prior to discharge to help obtain medications  T2DM A1c 7.2 in 11/2016. On metformin 500mg  BID at home, but states he has not taken any medications for the past 6 months or more. BG 115-127. - SSI-S - CBG monitoring  Tobacco use disorder Current smoker, 1ppd+ - Nicotine patch 14mg  qday  BPH Home regimen includes tamsulosin 0.8mg  daily and finasteride 5mg  daily. Unsure if patient was taking these medications.  Depression Insomnia Home regimen includes trazodone 100mg  QHS PRN and fluoxetine 20mg  daily. Unsure if patient was taking these medications. For the last  2 mornings, patient has been more talkative than usual. Alert and oriented x3 and able to answer questions appropriately. Speech is often nonsensical with tangential thoughts. Denies auditory or visual hallucinations. Concern for possible manic episode. Will consult psych to assist with evaluation - Psych  consult  Unsuitable home environment Patient describes that his home is unsuitable for home PT to come to. I am concerned that he may either be homeless or living in an unsafe environment. Have discussed with SW. Patient does not have insurance, but she stated they will see what they can do about having him placed while he recovers - SW consulted; appreciate their assistance - Hopefully patient will be able to DC to SNF for short-term recovery  Dispo: Anticipated discharge in approximately 1-2 day(s).  Scherrie Gerlach, MD 11/28/2017, 10:50 AM Pager: [phone]

## 2017-11-28 NOTE — Discharge Summary (Signed)
Name: Mark Graham MRN: [id number] DOB: Mar 23, 1956 62 y.o. PCP: [name], MD  Date of Admission: 11/24/2017  9:29 AM Date of Discharge: 12/01/2017 Attending Physician: Annia Belt, MD  Discharge Diagnosis: 1. Gangrene of left foot with osteomyelitis of 1st metatarsal 2/2 frostbite 2. S/p transmetatarsal amputation of left foot  Principal Problem:   S/P transmetatarsal amputation of foot, left (HCC) Active Problems:   Frostbite   Cellulitis   Discharge Medications: Allergies as of 12/01/2017   No Known Allergies     Medication List    STOP taking these medications   amLODipine 5 MG tablet Commonly known as:  NORVASC   finasteride 5 MG tablet Commonly known as:  PROSCAR   FLUoxetine 20 MG tablet Commonly known as:  PROZAC   hydrocortisone 2.5 % rectal cream Commonly known as:  ANUSOL-HC   lidocaine 2 % solution Commonly known as:  XYLOCAINE   tamsulosin 0.4 MG Caps capsule Commonly known as:  FLOMAX   traZODone 100 MG tablet Commonly known as:  DESYREL     TAKE these medications   atorvastatin 20 MG tablet Commonly known as:  LIPITOR Take 1 tablet (20 mg total) by mouth daily at 6 PM. What changed:  when to take this   glucose blood test strip Commonly known as:  TRUE METRIX BLOOD GLUCOSE TEST Use daily before breakfast   lisinopril 10 MG tablet Commonly known as:  PRINIVIL,ZESTRIL Take 1 tablet (10 mg total) by mouth daily.   metFORMIN 500 MG tablet Commonly known as:  GLUCOPHAGE Take 1 tablet (500 mg total) by mouth 2 (two) times daily with a meal.   oxyCODONE 5 MG immediate release tablet Commonly known as:  Oxy IR/ROXICODONE Take 1 tablet (5 mg total) by mouth every 8 (eight) hours as needed for up to 5 days for severe pain or breakthrough pain.   QUEtiapine 50 MG tablet Commonly known as:  SEROQUEL Take 1 tablet (50 mg total) by mouth at bedtime.   TRUE METRIX METER Devi 1 each by Does not apply route daily.   TRUEPLUS  LANCETS 28G Misc 1 each by Does not apply route daily.            Durable Medical Equipment  (From admission, onward)        Start     Ordered   11/29/17 0855  For home use only DME Walker rolling  Once    Question:  Patient needs a walker to treat with the following condition  Answer:  Weakness   11/29/17 0857      Disposition and follow-up:   Mr.Mark Graham was discharged from [facility] in Stable condition.  At the hospital follow up visit please address:  1.  Please assess pain in left foot post-amputation and functional ability.  2.  Please assist in social issues, including unsuitable home environment and making sure patient has appropriate medications. Please ensure patient is taking medications as prescribed.  3.  Labs / imaging needed at time of follow-up: None  4.  Pending labs/ test needing follow-up: BCx  Follow-up Appointments: [name] Follow up.   Why:  Rolling walker will be delivered to room prior to discharge Contact information: [place] [address] [phone]        [facility]. Go on 12/06/2017.   Why:  at 10:00am for an appointment with Dr [name].  Please bring your medications with you to the appointment.  Contact information: [place] [id number] [place] [facility] Course by problem list: Principal Problem:   S/P transmetatarsal amputation of foot, left (HCC) Active Problems:   Frostbite   Cellulitis   1. Gangrene of left foot with osteomyelitis of 1st metatarsal, s/p transmetatarsal amputation of left foot on 1/31 Secondary to frostbite from being in the snow 6 weeks prior to admission. Received ancef for several days pre-op and 1 dose post-op, which was discontinued due to appropriate source control. Pain was adequately managed with opioid medication. Worked with  PT/OT who recommended SNF. With assistance of SW and CM, he was approved for LOG and accepted at a SNF. He was discharged with 5 day course of oxy IR 47m q8h PRN physically in hand.  2. Post-operative fever Developed fever to 101.4 the day following amputation and 100.6 the day after. Patient had no evidence of a focal source for fevers. Surgical site did not appear infected, no cough or shortness of breath. Possibly related to atelectasis post-op? Blood cultures returned with 1/2 positive for gram positive rods. He was initiated on IV ceftriaxone. BCID negative, Cx returned diphtheroid bacteria. Repeat BCx were drawn on 2/3, which were NGTD. Ceftriaxone was discontinued due to initial blood culture growing a likely contaminant. He remained afebrile for >48h prior to discharge.  3. T2DM Last A1c 7.2 in 11/2016. Previously on metformin 5068mBID at home, however had not been taking this medication for at least 6 months prior to discharge. Patient was discharged with metformin 50036mID physically in hand. Also discharged with glucometer, test strips, and lancets physically in hand.  4. HTN Previously on amlodipine but had not been taking this medication for at least 6 months prior to discharge. Lisinopril was started during this admission with good control of his BP. He was discharged with lisinopril 64m50mily and atorvastatin 20mg46my at 6pm physically in hand. Consider increasing to 20mg 55my if BP is elevated.  5. Social issues, including unsuitable home environment and lack of insurance Patient describes that his home is unsuitable for home PT to come to. He seems to have electricity but no heat, and he described that some sections of his home do not have flooring. SW and CM were consulted for assistance. Patient does not have insurance. He was approved for LOG and discharged to SNF for short-term rehab. [facility] with  medications physically in hand. [facility] SW working on housing recommendations.  6. Depression, insomnia Chronic insomnia, previously treated with trazodone and fluoxetine, however patient was not taking these medications. Had some ?manic/pressured speech on several days during hospitalization (this improved prior to discharge). Evaluated by Psych who did not feel he met criteria for bipolar disorder or inpatient psych. They recommended seroquel. He was discharged with seroquel 50mg Q79mhysically in hand.  Discharge Vitals:   BP 99/81   Pulse 84   Temp 98.1 F (36.7 C)   Resp 18   Ht 5' 11"  (1.803 m)   Wt 144 lb 2.9 oz (65.4 kg)   SpO2 99%   BMI 20.11 kg/m   Pertinent Labs, Studies, and Procedures:  CBC Latest Ref Rng & Units 11/28/2017 11/27/2017 11/26/2017  WBC 4.0 - 10.5 K/uL 4.4 6.8 9.0  Hemoglobin 13.0 -  17.0 g/dL 10.6(L) 10.5(L) 11.2(L)  Hematocrit 39.0 - 52.0 % 31.7(L) 31.1(L) 34.3(L)  Platelets 150 - 400 K/uL 304 288 338   CMP Latest Ref Rng & Units 11/26/2017 11/25/2017 11/24/2017  Glucose 65 - 99 mg/dL 115(H) 131(H) 185(H)  BUN 6 - 20 mg/dL 9 5(L) 9  Creatinine 0.61 - 1.24 mg/dL 1.08 0.96 1.05  Sodium 135 - 145 mmol/L 135 140 136  Potassium 3.5 - 5.1 mmol/L 3.8 3.7 4.7  Chloride 101 - 111 mmol/L 101 104 100(L)  CO2 22 - 32 mmol/L 26 27 26   Calcium 8.9 - 10.3 mg/dL 8.4(L) 8.8(L) 9.0  Total Protein 6.5 - 8.1 g/dL - - 7.1  Total Bilirubin 0.3 - 1.2 mg/dL - - 0.4  Alkaline Phos 38 - 126 U/L - - 129(H)  AST 15 - 41 U/L - - 17  ALT 17 - 63 U/L - - 10(L)   BCx 1/30 with 1/2 GPR, BCID negative, Cx positive for diphtheroids BCx 2/3 NGTD  Right foot x-ray 11/24/2017 Negative  Left foot x-ray 11/24/2017 - Osteomyelitis involving the 1st distal tuft. Additional soft tissue necrosis involving the 1st proximal phalanx. - Soft tissue necrosis involving the distal aspect of the 2nd through 5th digits,  underlying osseous involvement suspected.  Left foot x-ray 11/25/2017 Status post transmetatarsal amputation of the left foot. Expected postoperative changes are noted.  Discharge Instructions: Discharge Instructions    Call MD for:  difficulty breathing, headache or visual disturbances   Complete by:  As directed    Call MD for:  persistant dizziness or light-headedness   Complete by:  As directed    Call MD for:  redness, tenderness, or signs of infection (pain, swelling, redness, odor or green/yellow discharge around incision site)   Complete by:  As directed    Call MD for:  severe uncontrolled pain   Complete by:  As directed    Call MD for:  temperature >100.4   Complete by:  As directed    Diet - low sodium heart healthy   Complete by:  As directed    Increase activity slowly   Complete by:  As directed      Signed: Colbert Ewing, MD 12/01/2017, 11:10 AM   Pager: Mamie Nick [phone]

## 2017-11-28 NOTE — Consult Note (Signed)
[facility] Face-to-Face Psychiatry Consult   Reason for Consult:  ''Nonsensical speech, concern for manic episode'' Referring Physician:  Dr. Josem Kaufmann Patient Identification: Mark Graham MRN:  [id number] Principal Diagnosis: S/P transmetatarsal amputation of foot, left [facility]) Diagnosis:   Patient Active Problem List   Diagnosis Date Noted  . S/P transmetatarsal amputation of foot, left (HCC) [Z89.432]   . Frostbite [T33.90XA] 11/24/2017  . Cellulitis [L03.90] 11/24/2017  . Depression [F32.9] 02/02/2017  . Insomnia [G47.00] 02/02/2017  . Protein-calorie malnutrition, severe [E43] 01/24/2017  . Urinary tract infection without hematuria [N39.0]   . Psychosis (HCC) [F29] 12/13/2016  . Weight loss [R63.4] 12/13/2016  . Abnormal ECG [R94.31] 12/13/2016  . Iron overload [E83.19] 12/13/2016  . Elevated bilirubin [R17] 12/13/2016  . PSA elevation [R97.20] 12/13/2016  . Diabetes mellitus (HCC) [E11.9] 12/12/2016  . Tobacco abuse [Z72.0] 10/21/2015  . Hyperglycemia [R73.9] 10/21/2015  . Urinary frequency [R35.0] 10/21/2015  . RUQ abdominal pain [R10.11] 10/21/2015  . Hypertension [I10] 03/03/2014  . Hyponatremia [E87.1] 03/03/2014  . Chest pain [R07.9] 03/03/2014  . Leukocytosis [D72.829] 03/03/2014  . Urinary retention [R33.9] 03/03/2014    Total Time spent with patient: 45 minutes  Subjective:   Mark Graham is a 62 y.o. male patient admitted with left foot osteomyelitis.  HPI:  Thanks for asking me to do a psychiatric consult on [name], a 62 yo male with PMH of T2DM, HTN, and tobacco use disorder who presents with blackening of the toes on his left foot and autoamputation of L 2nd-5th toes. Patient was seen and  interviewed in his room. He does not understand why his hospitalist wants him to see a psychiatrist, he states and I quote "I do not want to talk to a damm psychiatrist, I am not crazy.'' Patient reports that he saw a therapist in the past but has never been diagnosed with a  psychiatrist problem. He admits to occasional depressive symptoms due to multiple health issues and chronic sleep problems due to his left foot. He denies mood swings, psychosis or delusional thinking.  Past Psychiatric History: denies by the patient  Risk to Self: Is patient at risk for suicide?: No Risk to Others:   Prior Inpatient Therapy:   Prior Outpatient Therapy:    Past Medical History:  Past Medical History:  Diagnosis Date  . Arthritis    "probably in my hands" (11/24/2017)  . Cellulitis of left foot 11/24/2017  . High cholesterol   . Hypertension   . Osteomyelitis of left foot (HCC)    Osteomyelitis of left hallux with overlying cellulitis left foot/notes 11/24/2017  . Tobacco abuse   . Tuberculosis ~ 1990   "had tx for it"  . Type II diabetes mellitus (HCC)     Past Surgical History:  Procedure Laterality Date  . AMPUTATION Left 11/25/2017   Procedure: TRANSMETATARSAL AMPUTATION;  Surgeon: Roby Lofts, MD;  Location: [facility] OR;  Service: Orthopedics;  Laterality: Left;  . NO PAST SURGERIES     Family History:  Family History  Problem Relation Age of Onset  . Cancer Father   . Cancer Sister   . Diabetes Mellitus II Unknown        States 6 out of 13 of his siblings    Family Psychiatric  History:  Social History:  Social History   Substance and Sexual Activity  Alcohol Use Yes  . Alcohol/week: 3.6 oz  . Types: 6 Cans of beer per week     Social History  Substance and Sexual Activity  Drug Use Yes  . Types: Marijuana   Comment: 11/24/2017 "nothing since ~ 2002"    Social History   Socioeconomic History  . Marital status: Single    Spouse name: None  . Number of children: None  . Years of education: None  . Highest education level: None  Social Needs  . Financial resource strain: None  . Food insecurity - worry: None  . Food insecurity - inability: None  . Transportation needs - medical: None  . Transportation needs - non-medical: None   Occupational History  . None  Tobacco Use  . Smoking status: Current Every Day Smoker    Packs/day: 1.00    Years: 46.00    Pack years: 46.00    Types: Cigarettes  . Smokeless tobacco: Former Neurosurgeon    Types: Chew  Substance and Sexual Activity  . Alcohol use: Yes    Alcohol/week: 3.6 oz    Types: 6 Cans of beer per week  . Drug use: Yes    Types: Marijuana    Comment: 11/24/2017 "nothing since ~ 2002"  . Sexual activity: No  Other Topics Concern  . None  Social History Narrative  . None   Additional Social History:    Allergies:  No Known Allergies  Labs:  Results for orders placed or performed during the hospital encounter of 11/24/17 (from the past 48 hour(s))  Glucose, capillary     Status: Abnormal   Collection Time: 11/26/17  4:27 PM  Result Value Ref Range   Glucose-Capillary 121 (H) 65 - 99 mg/dL  Glucose, capillary     Status: None   Collection Time: 11/26/17  9:06 PM  Result Value Ref Range   Glucose-Capillary 96 65 - 99 mg/dL  CBC     Status: Abnormal   Collection Time: 11/27/17  7:04 AM  Result Value Ref Range   WBC 6.8 4.0 - 10.5 K/uL   RBC 3.22 (L) 4.22 - 5.81 MIL/uL   Hemoglobin 10.5 (L) 13.0 - 17.0 g/dL   HCT 29.5 (L) 62.1 - 30.8 %   MCV 96.6 78.0 - 100.0 fL   MCH 32.6 26.0 - 34.0 pg   MCHC 33.8 30.0 - 36.0 g/dL   RDW 65.7 84.6 - 96.2 %   Platelets 288 150 - 400 K/uL    Comment: Performed at [facility], [address] [place], [place] [address]  Glucose, capillary     Status: None   Collection Time: 11/27/17  8:28 AM  Result Value Ref Range   Glucose-Capillary 93 65 - 99 mg/dL  Glucose, capillary     Status: Abnormal   Collection Time: 11/27/17 12:04 PM  Result Value Ref Range   Glucose-Capillary 119 (H) 65 - 99 mg/dL  Glucose, capillary     Status: Abnormal   Collection Time: 11/27/17  5:15 PM  Result Value Ref Range   Glucose-Capillary 144 (H) 65 - 99 mg/dL  Glucose, capillary     Status: Abnormal   Collection Time: 11/27/17   9:39 PM  Result Value Ref Range   Glucose-Capillary 127 (H) 65 - 99 mg/dL  CBC     Status: Abnormal   Collection Time: 11/28/17  4:10 AM  Result Value Ref Range   WBC 4.4 4.0 - 10.5 K/uL   RBC 3.30 (L) 4.22 - 5.81 MIL/uL   Hemoglobin 10.6 (L) 13.0 - 17.0 g/dL   HCT 13.2 (L) 44.0 - 10.2 %   MCV 96.1 78.0 -  100.0 fL   MCH 32.1 26.0 - 34.0 pg   MCHC 33.4 30.0 - 36.0 g/dL   RDW 78.2 95.6 - 21.3 %   Platelets 304 150 - 400 K/uL    Comment: Performed at [facility], [address] [place], [place] [address]  Glucose, capillary     Status: Abnormal   Collection Time: 11/28/17  8:29 AM  Result Value Ref Range   Glucose-Capillary 101 (H) 65 - 99 mg/dL  Glucose, capillary     Status: Abnormal   Collection Time: 11/28/17 12:15 PM  Result Value Ref Range   Glucose-Capillary 134 (H) 65 - 99 mg/dL    Current Facility-Administered Medications  Medication Dose Route Frequency Provider Last Rate Last Dose  . acetaminophen (TYLENOL) tablet 650 mg  650 mg Oral Q6H PRN Toney Rakes, MD   650 mg at 11/27/17 2257  . cefTRIAXone (ROCEPHIN) 2 g in dextrose 5 % 50 mL IVPB  2 g Intravenous Q24H Darreld Mclean, MD   Stopped at 11/28/17 0229  . enoxaparin (LOVENOX) injection 40 mg  40 mg Subcutaneous Q24H Scherrie Gerlach, MD   40 mg at 11/27/17 1730  . insulin aspart (novoLOG) injection 0-9 Units  0-9 Units Subcutaneous TID WC Burna Cash, MD   1 Units at 11/28/17 1303  . lactulose (CHRONULAC) 10 GM/15ML solution 20 g  20 g Oral Daily PRN Angelita Ingles, MD   20 g at 11/27/17 2257  . lisinopril (PRINIVIL,ZESTRIL) tablet 10 mg  10 mg Oral Daily Eulah Pont, MD   10 mg at 11/28/17 8469  . naloxone [facility]) injection 0.4 mg  0.4 mg Intravenous PRN Toney Rakes, MD      . nicotine (NICODERM CQ - dosed in mg/24 hours) patch 14 mg  14 mg Transdermal Daily Eulah Pont, MD   14 mg at 11/28/17 0854  . oxyCODONE (Oxy IR/ROXICODONE) immediate release tablet 5 mg  5 mg Oral Q4H PRN  Toney Rakes, MD   5 mg at 11/28/17 6295    Musculoskeletal: Strength & Muscle Tone: within normal limits Gait & Station: not tested Patient leans: N/A  Psychiatric Specialty Exam: Physical Exam  Psychiatric: He has a normal mood and affect. His behavior is normal. Judgment and thought content normal. His speech is rapid and/or pressured. Cognition and memory are normal.    Review of Systems  Constitutional: Positive for weight loss.  HENT: Negative.   Eyes: Negative.   Respiratory: Negative.   Cardiovascular: Negative.   Gastrointestinal: Negative.   Genitourinary: Negative.   Skin: Negative.   Neurological: Negative.   Endo/Heme/Allergies: Negative.   Psychiatric/Behavioral: Negative.     Blood pressure 132/83, pulse 99, temperature 98.2 F (36.8 C), resp. rate 18, height 5\' 11"  (1.803 m), weight 65.4 kg (144 lb 2.9 oz), SpO2 98 %.Body mass index is 20.11 kg/m.  General Appearance: Casual  Eye Contact:  Good  Speech:  Clear and Coherent  Volume:  Normal  Mood:  Euthymic  Affect:  Appropriate  Thought Process:  Coherent and Descriptions of Associations: Intact  Orientation:  Full (Time, Place, and Person)  Thought Content:  Logical  Suicidal Thoughts:  No  Homicidal Thoughts:  No  Memory:  Immediate;   Good Recent;   Good Remote;   Good  Judgement:  Intact  Insight:  Fair  Psychomotor Activity:  Normal  Concentration:  Concentration: Fair and Attention Span: Fair  Recall:  Good  Fund of Knowledge:  Fair  Language:  Good  Akathisia:  No  Handed:  Right  AIMS (if indicated):     Assets:  Communication Skills  ADL's:  Intact  Cognition:  WNL  Sleep:   poor     Treatment Plan Summary: 62 year old man who was admitted due to left foot Osteomyelitis, he denies any prior mental history other than fleeting depressive symptoms and chronic insomnia. Currently, patient do not meet criteria for Bipolar disorder based on my  evaluation.  Plan/Recomendation: -Address chronic Insomnia. -May start patient on Seroquel 50 mg qhs for Insomnia. -Patient is cleared by psychiatric service. -Re-consult psych service in future if necessary.    Disposition: No evidence of imminent risk to self or others at present.   Patient does not meet criteria for psychiatric inpatient admission. Supportive therapy provided about ongoing stressors.  Thedore MinsAkintayo, Keslee Harrington, MD 11/28/2017 1:55 PM

## 2017-11-29 ENCOUNTER — Telehealth: Payer: Self-pay

## 2017-11-29 DIAGNOSIS — F22 Delusional disorders: Secondary | ICD-10-CM

## 2017-11-29 LAB — GLUCOSE, CAPILLARY
GLUCOSE-CAPILLARY: 92 mg/dL (ref 65–99)
GLUCOSE-CAPILLARY: 137 mg/dL — AB (ref 65–99)
Glucose-Capillary: 111 mg/dL — ABNORMAL HIGH (ref 65–99)
Glucose-Capillary: 103 mg/dL — ABNORMAL HIGH (ref 65–99)
Glucose-Capillary: 100 mg/dL — ABNORMAL HIGH (ref 65–99)

## 2017-11-29 LAB — CULTURE, BLOOD (ROUTINE X 2)
Culture: NO GROWTH
Special Requests: ADEQUATE

## 2017-11-29 MED ORDER — LISINOPRIL 10 MG PO TABS: 10.0000 mg | ORAL_TABLET | Freq: Every day | ORAL | 0 refills | Status: DC

## 2017-11-29 MED ORDER — TRUE METRIX METER DEVI: 1.0000 | Freq: Every day | 0 refills | Status: DC

## 2017-11-29 MED ORDER — METFORMIN HCL 500 MG PO TABS: 500.0000 mg | ORAL_TABLET | Freq: Two times a day (BID) | ORAL | 0 refills | Status: DC

## 2017-11-29 MED ORDER — GLUCOSE BLOOD VI STRP: ORAL_STRIP | 0 refills | Status: DC

## 2017-11-29 MED ORDER — TRUEPLUS LANCETS 28G MISC: 1.0000 | Freq: Every day | 0 refills | Status: DC

## 2017-11-29 MED ORDER — ATORVASTATIN CALCIUM 20 MG PO TABS: 20.0000 mg | ORAL_TABLET | Freq: Every day | ORAL | 0 refills | Status: DC

## 2017-11-29 MED ORDER — QUETIAPINE FUMARATE 50 MG PO TABS: 50.0000 mg | ORAL_TABLET | Freq: Every day | ORAL | 0 refills | Status: DC

## 2017-11-29 MED ORDER — OXYCODONE HCL 5 MG PO TABS: 5.0000 mg | ORAL_TABLET | Freq: Three times a day (TID) | ORAL | 0 refills | Status: DC | PRN

## 2017-11-29 MED ORDER — OXYCODONE HCL 5 MG PO TABS
5.0000 mg | ORAL_TABLET | Freq: Four times a day (QID) | ORAL | Status: DC | PRN
  Administered 2017-11-29 – 2017-11-30 (×2): 5 mg via ORAL
  Filled 2017-11-29 (×2): qty 1

## 2017-11-29 MED FILL — ?ATORVASTATIN 20 MG TABLET: 20 | 30 days supply | Qty: 30 | Fill #0

## 2017-11-29 MED FILL — ?METFORMIN HCL 500MG TABLET: 500 | 30 days supply | Qty: 60 | Fill #0

## 2017-11-29 MED FILL — LISINOPRIL 10 MG TABS: 10 | 30 days supply | Qty: 30 | Fill #0

## 2017-11-29 MED FILL — QUETIAPINE FUMARATE 50 MG T: 50 | 30 days supply | Qty: 30 | Fill #0

## 2017-11-29 NOTE — Progress Notes (Signed)
Medicine attending: I examined this patient today together with resident physician Dr. Scherrie GerlachJennifer Huang and I concur with her evaluation and management plan which we discussed together. 62 year old man admitted on January 30 for further evaluation of gangrene of the toes of his left foot with signs of underlying osteomyelitis.  Presumed result of frostbite sustained a few weeks ago.  He underwent uncomplicated transmetatarsal amputation on January 31.  Wound inspected today and appears to be healing without any sign of infection or suture breakdown. Over the weekend, he developed a fever to 101.4.  He also experienced transient delusional state and we were concerned enough to get psychiatric consultation.  He was not felt to have a underlying psychiatric condition.  Some of his delusional behavior felt possibly related to insomnia and Seroquel recommended.  Patient's mental function and temperature have returned to his baseline today.  He has no focal neurologic deficit on exam.  One blood culture grew diphtheroids likely a skin contaminant.  He is currently afebrile and not on antibiotics.  White count done on the day of his fever 6800.  Repeat yesterday 4400. The only thing holding up his discharge at this time is our concern that he has an unstable living situation.  He has electricity but no heat.  He remarked that his house was a mess and that part of the house did not have a floor.  We have discussed this with our care management team.  He is handicapped by the absence of any health insurance.  However I think it is important to try to find a reasonable temporary living situation for him where he can recuperate from the foot surgery.

## 2017-11-29 NOTE — Progress Notes (Addendum)
Occupational Therapy Treatment and Discharge  Patient Details Name: Mark Graham MRN: [id number] DOB: 11/29/1955 Today's Date: 11/29/2017    History of present illness Pt is a 62 y.o. male s/p L transmetatarsal amputation. PMHx: DM, HTN.   OT comments  Pt currently overall mod I for ADL and functional mobility with use of RW. Pt maintaining LLE NWB due to pain. Educated pt on elevation of LLE for pain/edema management. All OT goals met; will d/c from acute OT services. Please re-consult if needs change.   Follow Up Recommendations  No OT follow up;Supervision - Intermittent    Equipment Recommendations  3 in 1 bedside commode    Recommendations for Other Services      Precautions / Restrictions Precautions Precautions: Fall Restrictions Weight Bearing Restrictions: Yes LLE Weight Bearing: Weight bearing as tolerated(through heel only)       Mobility Bed Mobility Overal bed mobility: Modified Independent                Transfers Overall transfer level: Modified independent Equipment used: Rolling walker (2 wheeled)                  Balance Overall balance assessment: Needs assistance Sitting-balance support: No upper extremity supported;Feet supported Sitting balance-Leahy Scale: Good     Standing balance support: Bilateral upper extremity supported;During functional activity Standing balance-Leahy Scale: Fair                             ADL either performed or assessed with clinical judgement   ADL Overall ADL's : Modified independent     Grooming: Modified independent;Sitting;Brushing hair               Lower Body Dressing: Modified independent;Sit to/from stand Lower Body Dressing Details (indicate cue type and reason): Educated on compensatory strategies, L foot into clothing first Toilet Transfer: Modified Independent;Ambulation;RW Toilet Transfer Details (indicate cue type and reason): Simulated by sit to stand from EOB with  functional mobility.       Tub/Shower Transfer Details (indicate cue type and reason): Pt planning to sponge bathe initially Functional mobility during ADLs: Modified independent;Rolling walker General ADL Comments: Educated on elevation to LUE for edema and pain management. Pt keeping LLE NWB duirng mobility.     Vision       Perception     Praxis      Cognition Arousal/Alertness: Awake/alert Behavior During Therapy: WFL for tasks assessed/performed Overall Cognitive Status: Within Functional Limits for tasks assessed                                          Exercises     Shoulder Instructions       General Comments      Pertinent Vitals/ Pain       Pain Assessment: 0-10 Pain Score: 7  Pain Location: L foot in dependent position Pain Descriptors / Indicators: Aching Pain Intervention(s): Monitored during session;Patient requesting pain meds-RN notified  Home Living                                          Prior Functioning/Environment              Frequency             Progress Toward Goals  OT Goals(current goals can now be found in the care plan section)  Progress towards OT goals: Goals met/education completed, patient discharged from OT  Acute Rehab OT Goals Patient Stated Goal: return home OT Goal Formulation: All assessment and education complete, DC therapy  Plan All goals met and education completed, patient discharged from OT services    Co-evaluation                 AM-PAC PT "6 Clicks" Daily Activity     Outcome Measure   Help from another person eating meals?: None Help from another person taking care of personal grooming?: None Help from another person toileting, which includes using toliet, bedpan, or urinal?: None Help from another person bathing (including washing, rinsing, drying)?: None Help from another person to put on and taking off regular upper body clothing?: None Help from  another person to put on and taking off regular lower body clothing?: None 6 Click Score: 24    End of Session Equipment Utilized During Treatment: Rolling walker  OT Visit Diagnosis: Other abnormalities of gait and mobility (R26.89)   Activity Tolerance Patient tolerated treatment well   Patient Left with call bell/phone within reach;Other (comment)(sitting EOB)   Nurse Communication          Time: 3154-0086 OT Time Calculation (min): 19 min  Charges: OT General Charges $OT Visit: 1 Visit OT Treatments $Self Care/Home Management : 8-22 mins  Kammi Hechler A. Ulice Brilliant, M.S., OTR/L Pager: [place] 11/29/2017, 9:43 AM

## 2017-11-29 NOTE — Care Management Note (Addendum)
Case Management Note  Patient Details  Name: Deberah CastleJerry D Lawn MRN: [id number] Date of Birth: 11/14/1955  Subjective/Objective:    Presented with  left foot osteomyelitis.PMHx: DM, HTN.  Pt states from home alone. Brother lives close by. States @ d/c brother will help if needed. PTA independent with adl's, no dme usage. Pt without health insurance , [facility], PCP is Dr.Newlin.    - s/p L transmetatarsal amputation        PCP:  Dr. Alvis LemmingsNewlin/ [facility]  Action/Plan: Transition to home with home health services (charity case if approved) vs SNF Placement/ LOG if approved  Expected Discharge Date:    11/29/2017           Expected Discharge Plan:     In-House Referral:  [facility], pt states has electricity but without heat in the home , states uses heating pad to warm body.   Discharge planning Services  CM Consult, Follow-up appt scheduled, [facility]  Post Acute Care Choice:    Choice offered to:  Patient  DME Arranged:  Dan HumphreysWalker rolling(charity case) DME Agency:  [facility]  HH Arranged:    HH Agency:     Status of Service:  In process, will continue to follow  If discussed at Long Length of Stay Meetings, dates discussed:    Additional Comments:  Epifanio LeschesCole, Deakin Lacek Hudson, RN 11/29/2017, 10:52 AM

## 2017-11-29 NOTE — Progress Notes (Signed)
11/29/2017 1325 CM made aware per CSW after discussing case with Assist.CSW director she would offer SNF /LOG placement to pt. CM shared information with  MD.  Gae GallopAngela Shizuko Wojdyla RN,BSN,CM

## 2017-11-29 NOTE — Telephone Encounter (Signed)
Call placed to Gae GallopAngela Cole, RN CM to inform her that the patient is [facility].  He has been living in a home that his brother owns but he had reported that there is no heat in the house. This [place].   He has no insurance. [facility] been [facility] him with obtaining his medications. He has relied on the city bus for transportation and had been walking to the store for his food.   Concerns discussed regarding the patient's ability to care for him self after surgery as he has no support in the community,

## 2017-11-29 NOTE — Progress Notes (Signed)
MD notified of patient's blood pressure of 97/73. MD said to hold the lisinopril.

## 2017-11-29 NOTE — Clinical Social Work Note (Signed)
Clinical Social Work Assessment  Patient Details  Name: Mark Graham MRN: [id number] Date of Birth: 1956-06-20  Date of referral:  11/29/17               Reason for consult:  Discharge Planning, Facility Placement                Permission sought to share information with:    Permission granted to share information::     Name::     Mark Graham  Agency::  snf  Relationship::  brother  Contact Information:  [phone]  Housing/Transportation Living arrangements for the past 2 months:  Single Family Home Source of Information:  Patient Patient Interpreter Needed:  None Criminal Activity/Legal Involvement Pertinent to Current Situation/Hospitalization:  No - Comment as needed Significant Relationships:  Adult Children Lives with:  Self Do you feel safe going back to the place where you live?  Yes Need for family participation in patient care:  Yes (Comment)  Care giving concerns:  No family at bedside   Social Worker assessment / plan:  CSW met patient at bedside to discuss disposition plan. Patient stated he in his brother house alone. Patient stated he has another brother that lives in the tool [place] on the property.Patient stated he is agreeable to discharge to SNF and would like that. CSW has reached out to Surveyor, quantity of social work to attain an [place]. Unfortunately at this time [facility] is unable to offer an LOG. Patient does not have resources to place himself into a short term rehab   Employment status:  Retired Forensic scientist:  Other (Comment Required)(not insured) PT Recommendations:  Home with [place] / Referral to community resources:  [place]  Patient/Family's Response to care:  Patient very appreciative of hospitals role in his care  Patient/Family's Understanding of and Emotional Response to Diagnosis, Current Treatment, and Prognosis: Patient very pleasant   Emotional Assessment Appearance:  Appears stated  age Attitude/Demeanor/Rapport:  Other Affect (typically observed):  Accepting Orientation:  Oriented to Self, Oriented to Situation, Oriented to Place, Oriented to  Time Alcohol / Substance use:  Not Applicable Psych involvement (Current and /or in the community):  No (Comment)  Discharge Needs  Concerns to be addressed:  Discharge Planning Concerns, Home Safety Concerns Readmission within the last 30 days:  No Current discharge risk:  Lives alone Barriers to Discharge:  Unsafe home situation   Wende Neighbors, LCSW 11/29/2017, 4:32 PM

## 2017-11-29 NOTE — Progress Notes (Signed)
Physical Therapy Treatment Patient Details Name: Mark Graham MRN: [id number] DOB: 06/24/1956 Today's Date: 11/29/2017    History of Present Illness Pt is a 62 y.o. male s/p L transmetatarsal amputation. PMHx: DM, HTN.    PT Comments    Pt is making steady progress towards his mobility goals, however prefers to not WB through his L LE and utilizes RW and hops with a swing through gait. From a mobility stand point pt does not have the support at home to help him safely get into his house given it has 4 steps without handrails to enter and he is currently only ambulating 25 feet with his RW. Pt is able to hop up 4 steps with bilateral handrails, without handrails he is only able to bump himself up the steps and is concern how he will get his RW up to the porch. As such PT is recommending discharge to a SNF facility for safety in gait and stair training.    Follow Up Recommendations  SNF     Equipment Recommendations  Rolling walker with 5" wheels    Recommendations for Other Services       Precautions / Restrictions Precautions Precautions: Fall Restrictions Weight Bearing Restrictions: Yes LLE Weight Bearing: Weight bearing as tolerated    Mobility  Bed Mobility Overal bed mobility: Modified Independent Bed Mobility: Supine to Sit           General bed mobility comments: HOB up, no help  Transfers Overall transfer level: Needs assistance Equipment used: Rolling walker (2 wheeled) Transfers: Sit to/from Stand Sit to Stand: Supervision         General transfer comment: supervision for safety,   Ambulation/Gait Ambulation/Gait assistance: Min guard Ambulation Distance (Feet): 25 Feet Assistive device: Rolling walker (2 wheeled) Gait Pattern/deviations: Step-to pattern Gait velocity: decreased Gait velocity interpretation: Below normal speed for age/gender General Gait Details: pt not able to put weight onto the heel today, but able to " swing to " on the R LE  smoothly.   Stairs     Stair Management: Two rails;Step to pattern   General stair comments: pt "hopped" up the steps on R LE without putting weight on the L LE with use of bilateral rails, attempted to teach use of RW to hop backwards up to step, pt reports he won't have anyone at home when he is d/c'd, resorted to bumping backwards up stairs on his bottom whic he was able to do without assist    Balance Overall balance assessment: Needs assistance Sitting-balance support: No upper extremity supported;Feet supported Sitting balance-Leahy Scale: Good     Standing balance support: Bilateral upper extremity supported Standing balance-Leahy Scale: Fair Standing balance comment: can release walker in static standing at sink                            Cognition Arousal/Alertness: Awake/alert Behavior During Therapy: WFL for tasks assessed/performed Overall Cognitive Status: Within Functional Limits for tasks assessed                                               Pertinent Vitals/Pain Pain Assessment: Faces Faces Pain Scale: Hurts even more Pain Location: L foot Pain Descriptors / Indicators: Throbbing;Grimacing;Moaning;Guarding Pain Intervention(s): Limited activity within patient's tolerance;Monitored during session;Repositioned  PT Goals (current goals can now be found in the care plan section) Acute Rehab PT Goals Patient Stated Goal: return home PT Goal Formulation: With patient Time For Goal Achievement: 12/09/17 Potential to Achieve Goals: Good Progress towards PT goals: Progressing toward goals    Frequency    Min 5X/week      PT Plan Discharge plan needs to be updated       AM-PAC PT "6 Clicks" Daily Activity  Outcome Measure  Difficulty turning over in bed (including adjusting bedclothes, sheets and blankets)?: None Difficulty moving from lying on back to sitting on the side of the bed? : None Difficulty sitting  down on and standing up from a chair with arms (e.g., wheelchair, bedside commode, etc,.)?: A Little Help needed moving to and from a bed to chair (including a wheelchair)?: None Help needed walking in hospital room?: A Little Help needed climbing 3-5 steps with a railing? : A Little 6 Click Score: 21    End of Session   Activity Tolerance: Patient tolerated treatment well Patient left: in bed;with call bell/phone within reach Nurse Communication: Mobility status PT Visit Diagnosis: Other abnormalities of gait and mobility (R26.89)     Time: 1610-9604 PT Time Calculation (min) (ACUTE ONLY): 27 min  Charges:  $Gait Training: 23-37 mins                    G Codes:       Evelen Vazguez B. Beverely Risen PT, DPT Acute Rehabilitation  [phone] Pager [phone]     Mark Graham 11/29/2017, 4:22 PM

## 2017-11-29 NOTE — Progress Notes (Addendum)
Subjective:  [name] was seen sitting up in bed comfortably this morning. Evaluated by Psych yesterday who do not feel he meets criteria for bipolar disorder/inpatient psych. He continues to have pain in his left lower extremity. Denies fevers, chills, or chest pain. States he does not have heating at home and uses a wood stove/logs to warm his residence. Asks if he is going home today. His room smelled strongly of smoke this morning. When asked about whether he had smoked in his room, he stated that "I only had a few puffs". We discussed that smoking in the hospital is prohibited and the danger of smoking in his room, particularly with oxygen hooked up in the wall. He expressed understanding. He handed his lighter over to his RN who stated that she would put the lighter with his other belongings and it would be returned on discharge.  Objective:  Vital signs in last 24 hours: Vitals:   11/27/17 2143 11/28/17 0542 11/28/17 1324 11/28/17 2232  BP: (!) 173/98 (!) 150/82 132/83 120/82  Pulse: (!) 111 83 99 99  Resp: 18 18 18  (!) 8  Temp: (!) 100.6 F (38.1 C) 98 F (36.7 C) 98.2 F (36.8 C) 99 F (37.2 C)  TempSrc:  Oral  Oral  SpO2: 96% 99% 98% 96%  Weight:      Height:       GEN: Well-nourished. Alert and oriented. No acute distress. RESP: Clear to auscultation bilaterally. No wheezes, rales, or rhonchi. No increased work of breathing. CV: Normal rate and regular rhythm. No murmurs, gallops, or rubs. No LE edema. EXT: No edema. Warm. L foot with healing wound without evidence of infection at surgical site. No TTP of BLE. RLE with 2+ DP pulse, feels warm and without evidence of lesions or ulcers. NEURO: Cranial nerves II-XII grossly intact. Able to lift all four extremities against gravity. No apparent audiovisual hallucinations. Speech fluent and nonsensical at times. PSYCH: Patient is calm and pleasant. Mood is elevated, talkative. Speech fluent, still nonsensical but improved from  yesterday. Answers questions appropriately.  Assessment/Plan:  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 S/p L transmetatarsal amputation 1/31 and 2 days of cephalexin, subsequently discontinued He was started on ceftriaxone due to 1/2 BCx positive for gram positive rods, BCID negative. Cx returned with diphtheroids, which is almost certainly a contaminant. Ceftriaxone has been discontinued. Repeat BCx 2/3 NGTD. Surgical cultures were not obtained. CBC without leukocytosis. Afebrile this morning with stable vital signs. Patient denies chest pain, shortness of breath, or fevers/chills. - Continue to monitor fever curve - Continue weaning pain medication to PO oxy 5mg  q6h PRN - Narcan prn - PT/OT - F/u ortho outpatient ~ 2 weeks  HTN Home regimen includes amlodipine 5mg  daily, but he has not taken medications for the past 6 months or more. BP 120/82 this AM. Started lisinopril this admission. -Continuelisinopril 10mg  qday, can consider increasing to 20mg  daily if BP remains elevated - CM consult prior to discharge to help obtain medications  T2DM A1c 7.2 in 11/2016. On metformin 500mg  BID at home, but states he has not taken any medications for the past 6 months or more. - SSI-S - CBG monitoring  Tobacco use disorder Current smoker, 1ppd+. Room smelled strongly of smoke this morning. Patient admitted to smoking in the room. He was advised of the dangers of smoking in the hospital and that it is prohibited. He expressed  understanding. He handed over his lighter to his RN who stated that she will place it with his belongings and it will be returned on discharge - Nicotine patch 14mg  qday  BPH Home regimen includes tamsulosin 0.8mg  daily and finasteride 5mg  daily. Unsure if patient was taking these medications.  Depression Insomnia Home regimen includes trazodone 100mg  QHS PRN and fluoxetine 20mg  daily. Unsure if patient was taking these medications. For the last 2 mornings, patient has been more talkative than usual. Alert and oriented x3 and able to answer questions appropriately. Speech is often nonsensical with tangential thoughts. Denies auditory or visual hallucinations. Evaluated by Psych yesterday who do not feel he meets criteria for bipolar disorder/inpatient psych. - Psych initiated seroquel 50mg  QHS for sleep  Unsuitable home environment Patient describes that his home is unsuitable for home PT to come to. I am concerned that he may either be homeless or living in an unsafe environment. Discussed with CM today who will talk to their director for appropriate placement/resources. - Discharge medications sent to [facility] - SW consulted; appreciate their assistance  Dispo: Anticipated discharge in approximately 1-2 day(s).  Scherrie Gerlach, MD 11/29/2017, 6:28 AM Pager: [phone]

## 2017-11-29 NOTE — Progress Notes (Signed)
Patient was smoking in the room. Patient was educated about the hazards of smoking and the oxygen on the wall behind him. He was also educated about quitting smoking. I asked him if he would give me the lighter and we would keep it till he was discharged. He handed me the lighter and I put it in a bag with a patient sticker on it. It was placed in the drawer next to the charge nurse. The lighter is yellow.

## 2017-11-29 NOTE — Progress Notes (Signed)
Foot dressing was changed.

## 2017-11-30 LAB — GLUCOSE, CAPILLARY
Glucose-Capillary: 108 mg/dL — ABNORMAL HIGH (ref 65–99)
Glucose-Capillary: 108 mg/dL — ABNORMAL HIGH (ref 65–99)
Glucose-Capillary: 85 mg/dL (ref 65–99)
Glucose-Capillary: 136 mg/dL — ABNORMAL HIGH (ref 65–99)

## 2017-11-30 MED ORDER — OXYCODONE HCL 5 MG PO TABS
5.0000 mg | ORAL_TABLET | Freq: Three times a day (TID) | ORAL | Status: DC | PRN
  Administered 2017-12-01: 5 mg via ORAL
  Filled 2017-11-30: qty 1

## 2017-11-30 NOTE — Progress Notes (Signed)
Subjective:  [name] was seen sitting up in bed eating breakfast this morning. Denies fevers, chills, or chest pain. Continues to endorse pain in his left lower extremity, but it is improving. Amenable to discharge to SNF, however per SW, he does not qualify for SNF. SW planning to f/u with [facility] liaison for housing applications.  Objective:  Vital signs in last 24 hours: Vitals:   11/29/17 0921 11/29/17 1517 11/29/17 2121 11/30/17 0549  BP: 97/73 126/86 125/79 133/76  Pulse:  86 88 69  Resp:  18 18 18   Temp:  (!) 97.5 F (36.4 C) 98 F (36.7 C) 97.9 F (36.6 C)  TempSrc:  Oral Oral   SpO2:  100% 98% 99%  Weight:      Height:       GEN: Well-nourished. Alert and oriented. No acute distress. RESP: Clear to auscultation bilaterally. Mild diffuse wheezing. No increased work of breathing. CV: Normal rate and regular rhythm. No murmurs, gallops, or rubs. No LE edema. EXT: No edema. Warm. L foot with healing wound without evidence of infection at surgical site. Able to move LLE. No TTP of BLE. RLE with 2+ DP pulse, feels warm and without evidence of lesions or ulcers. NEURO: Cranial nerves II-XII grossly intact. Able to lift all four extremities against gravity. No apparent audiovisual hallucinations. Speech fluent and nonsensical at times. PSYCH: Patient is calm and pleasant. Mood is appropriate. Speech fluent and appropriate. Answers questions appropriately.  Assessment/Plan:  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 S/p L transmetatarsal amputation 1/31 and 2 days of cephalexin, subsequently discontinued Continues to be afebrile for >48h, repeat BCx 2/3 NGTD. CBC without leukocytosis.  Stable vital signs. Patient denies chest pain, shortness of breath, or fevers/chills. - Continue to monitor fever curve - Continue weaning pain medication to PO oxy 5mg  q8h PRN - Narcan prn - PT/OT - F/u ortho outpatient ~ 2 weeks  HTN Home regimen includes amlodipine 5mg  daily, but he has not taken medications for the past 6 months or more. BP 129/75 this AM. Started lisinopril this admission. -Continuelisinopril 10mg  qday, can consider increasing to 20mg  daily if BP remains elevated - CM consult prior to discharge to help obtain medications  T2DM A1c 7.2 in 11/2016. On metformin 500mg  BID at home, but states he has not taken any medications for the past 6 months or more. - SSI-S - CBG monitoring  Tobacco use disorder Current smoker, 1ppd+.  - Nicotine patch 14mg  qday  BPH Home regimen includes tamsulosin 0.8mg  daily and finasteride 5mg  daily. Unsure if patient was taking these medications.  Depression Insomnia Home regimen includes trazodone 100mg  QHS PRN and fluoxetine 20mg  daily. Unsure if patient was taking these medications.  - Continue seroquel 50mg  QHS for sleep  Unsuitable home environment Patient describes that his home is unsuitable for home PT to come to. I am concerned that he may either be homeless or living in an unsafe environment. Discussed with SW and CM. SW feel that  he does not qualify for SNF placement. Working on Human resources officer. - Discharge medications sent to [facility] - SW consulted; appreciate their assistance  Dispo: Anticipated discharge in approximately 1-2 day(s).  Scherrie Gerlach, MD 11/30/2017, 10:56 AM Pager: [phone]

## 2017-11-30 NOTE — Progress Notes (Addendum)
12:27pm-CSW sent in VISPDAT to [facility]. [facility], Erskine SquibbJane, to visit patient in the hospital today before he discharges. Taxi voucher provided on chart. CSW signing off.   8:41am-CSW received handoff from previous CSW. In QC meeting today, patient was discussed with CSW Director. Patient has no skillable needs at this time and we are unable to provide an LOG to SNF. CSW to follow up with [facility] lia[facility] to see what housing applications they have assisted patient with.   Osborne Cascoadia Nethaniel Mattie LCSW [phone]

## 2017-11-30 NOTE — Hospital Discharge Follow-Up (Signed)
Met with the patient to discuss medical follow up after discharge if he is going home v. SNF.  He is known to the [facility] and is agreeable to follow up at the [facility] with Dr [name].  An appointment was scheduled for 12/06/17 @ 1000.  Cab transportation can be provided. He has used the city bus in the past. He has used [facility] for his medications but reports that he has not taken any medication for a few months.  [facility] filled his discharge prescriptions and the medications were given to Whitman Hero, RN CM for the patient to receive at discharge if he is going home.  He noted that his [employer] card and [place] cards have expired and he will need to schedule an appointment with [facility] Financial Counselor to re-apply for both cards. He does not have any income. He noted that he as $50 that was given to him at Christmas.  He receives $191/month in food stamps. He stated that he has plenty of food at home for a short time but is not sure how he will get food after he gets home as he has been walking to the grocery store to get his own food. He now has a RW to use with ambulation. He noted that he will need to go up the 4 stairs into the home on his bottom as he is not able to navigate the stairs at this time.   He explained that the house needs a lot of work due to damage from termites and he is not sure when that will be done. He has a space heater and a wood stove but no other heat. He does have running water. He has a brother who owns the house he is in and the brother does not charge him to live there. That brother does not live in the area.  He has another brother who lives in a shed on the property.   Update provided to Whitman Hero, RN CM.  Will follow for discharge plan

## 2017-11-30 NOTE — Progress Notes (Addendum)
CM spoke with Medical Advisor regarding pt's physical condition  and home situation/ transitional needs. Medical Advisor stated will discuss case with CSW Director and f/u with CM. Gae GallopAngela Izell Labat RN,BSN,CM

## 2017-11-30 NOTE — Progress Notes (Signed)
Physical Therapy Treatment Patient Details Name: Mark Graham MRN: [id number] DOB: [birth year] Today's Date: 11/30/2017    History of Present Illness Pt is a 62 y.o. male s/p L transmetatarsal amputation. PMHx: DM, HTN.    PT Comments    Pt continues to make progress towards his goals and is able to ambulate with WBAT use of R heel. Although pt has progressed he is still unable to safely ascend/descent 5 steps into home and would be best served with placement in a more accessible environment. PT will continue to follow acutely.   Follow Up Recommendations  SNF     Equipment Recommendations  Rolling walker with 5" wheels    Recommendations for Other Services       Precautions / Restrictions Precautions Precautions: Fall Restrictions Weight Bearing Restrictions: Yes LLE Weight Bearing: Weight bearing as tolerated    Mobility  Bed Mobility Overal bed mobility: Modified Independent Bed Mobility: Supine to Sit           General bed mobility comments: HOB up, no help  Transfers Overall transfer level: Needs assistance Equipment used: Rolling walker (2 wheeled) Transfers: Sit to/from Stand Sit to Stand: Supervision         General transfer comment: supervision for safety,   Ambulation/Gait Ambulation/Gait assistance: Min guard Ambulation Distance (Feet): 300 Feet Assistive device: Rolling walker (2 wheeled) Gait Pattern/deviations: Step-to pattern Gait velocity: decreased Gait velocity interpretation: Below normal speed for age/gender General Gait Details: Pt able to utilize R LE heel to ambulate down [place] with RW assist, and min guard   Stairs     Stair Management: Step to pattern;No rails Number of Stairs: 2 General stair comments: pt able to ascend with use of R LE however still requires minA for ascent/descent without railings    Balance Overall balance assessment: Needs assistance Sitting-balance support: No upper extremity supported;Feet  supported Sitting balance-Leahy Scale: Good     Standing balance support: Bilateral upper extremity supported Standing balance-Leahy Scale: Fair Standing balance comment: can release walker in static standing at sink                            Cognition Arousal/Alertness: Awake/alert Behavior During Therapy: WFL for tasks assessed/performed Overall Cognitive Status: Within Functional Limits for tasks assessed                                               Pertinent Vitals/Pain Faces Pain Scale: Hurts a little bit Pain Location: L foot Pain Descriptors / Indicators: Throbbing;Grimacing;Moaning;Guarding Pain Intervention(s): Limited activity within patient's tolerance;Monitored during session           PT Goals (current goals can now be found in the care plan section) Acute Rehab PT Goals Patient Stated Goal: return home PT Goal Formulation: With patient Time For Goal Achievement: 12/09/17 Potential to Achieve Goals: Good Progress towards PT goals: Progressing toward goals    Frequency    Min 5X/week      PT Plan Current plan remains appropriate       AM-PAC PT "6 Clicks" Daily Activity  Outcome Measure  Difficulty turning over in bed (including adjusting bedclothes, sheets and blankets)?: None Difficulty moving from lying on back to sitting on the side of the bed? : None Difficulty sitting down on and standing up from a chair  with arms (e.g., wheelchair, bedside commode, etc,.)?: A Little Help needed moving to and from a bed to chair (including a wheelchair)?: None Help needed walking in hospital room?: A Little Help needed climbing 3-5 steps with a railing? : A Little 6 Click Score: 21    End of Session Equipment Utilized During Treatment: Gait belt Activity Tolerance: Patient tolerated treatment well Patient left: in bed;with call bell/phone within reach Nurse Communication: Mobility status PT Visit Diagnosis: Other  abnormalities of gait and mobility (R26.89)     Time: [id number] PT Time Calculation (min) (ACUTE ONLY): 18 min  Charges:  $Gait Training: 8-22 mins                    G Codes:       Mark Graham B. Beverely RisenVan Fleet PT, DPT Acute Rehabilitation  [phone] Pager [phone]     Elon Alaslizabeth B Van Fleet 11/30/2017, 1:04 PM

## 2017-12-01 DIAGNOSIS — F308 Other manic episodes: Secondary | ICD-10-CM

## 2017-12-01 DIAGNOSIS — R5082 Postprocedural fever: Secondary | ICD-10-CM

## 2017-12-01 LAB — GLUCOSE, CAPILLARY
GLUCOSE-CAPILLARY: 111 mg/dL — AB (ref 65–99)
GLUCOSE-CAPILLARY: 102 mg/dL — AB (ref 65–99)
Glucose-Capillary: 186 mg/dL — ABNORMAL HIGH (ref 65–99)

## 2017-12-01 MED ORDER — OXYCODONE HCL 5 MG PO TABS: 5.0000 mg | ORAL_TABLET | Freq: Three times a day (TID) | ORAL | 0 refills | Status: AC | PRN

## 2017-12-01 NOTE — Discharge Instructions (Signed)
Mr. Mark Graham,  You were admitted in the hospital with an infection of your foot and bone from being in the cold weather for too long. You are going to a facility to help with recovering from the surgery and to help build your strength back so that it will be more safe for you to go back home.  Please take your medicines as prescribed, and it is VERY important that you follow up with your new primary doctor to set up care. Your new doctor will be able to manage your chronic medical conditions to help keep you out of the hospital.

## 2017-12-01 NOTE — Progress Notes (Signed)
[facility] will DC to: [facility] Anticipated DC date: 12/01/17 Family notified: Pt declined Transport by: [facility]  Please make sure signed Oxy script is sent with pt.    Per MD [facility]. RN, patient, patient's family, and facility notified of DC. Discharge Summary sent to facility. RN given number for report [phone]). DC packet on chart. Ambulance transport requested for patient.   CSW signing off.  Cristobal GoldmannNadia Arena Lindahl, [place]CSWA Clinical Social Worker [phone]

## 2017-12-01 NOTE — Clinical Social Work Placement (Signed)
   CLINICAL SOCIAL WORK PLACEMENT  NOTE  Date:  12/01/2017  Patient Details  Name: Mark CastleJerry D Augustine MRN: [id number] Date of Birth: 10/05/1956  Clinical Social Work is seeking post-discharge placement for this patient at the Skilled  Nursing Facility level of care (*CSW will initial, date and re-position this form in  chart as items are completed):  Yes   Patient/family provided with [facility] Clinical Social Work Department's list of facilities offering this level of care within the geographic area requested by the patient (or if unable, by the patient's family).  Yes   Patient/family informed of their freedom to choose among providers that offer the needed level of care, that participate in Medicare, Medicaid or managed care program needed by the patient, have an available bed and are willing to accept the patient.  Yes   Patient/family informed of [facility]'s ownership interest in [facility] and [facility], as well as of the fact that they are under no obligation to receive care at these facilities.  PASRR submitted to EDS on 12/01/17     PASRR number received on 12/01/17     Existing PASRR number confirmed on       FL2 transmitted to all facilities in geographic area requested by pt/family on 12/01/17     FL2 transmitted to all facilities within larger geographic area on       Patient informed that his/her managed care company has contracts with or will negotiate with certain facilities, including the following:        Yes   Patient/family informed of bed offers received.  [facility]     Physician recommends and patient chooses bed at      Patient to be transferred to [facility] on 12/01/17.  Patient to be transferred to facility by PTAR     Patient family notified on 12/01/17 of transfer.  Name of family member notified:  N/A Pt declined     PHYSICIAN       Additional Comment:    _______________________________________________ Mearl LatinNadia  S Shanin Szymanowski, LCSWA 12/01/2017, 12:30 PM

## 2017-12-01 NOTE — Progress Notes (Signed)
CSW Director contacted CSW to approve patient to have an log to SNF. CSW sent referral out.   Osborne Cascoadia Paydon Carll LCSW [phone]

## 2017-12-01 NOTE — [place] FL2 (Signed)
[place] MEDICAID FL2 LEVEL OF CARE SCREENING TOOL     IDENTIFICATION  Patient Name: Mark Graham Birthdate: 12/15/1955 Sex: male Admission Date (Current Location): 11/24/2017  [facility] [place]   Facility and Address:  [facility] [place], [address], [place], [place] [address]      Provider Number: [id number]  Attending Physician Name and Address:  Levert FeinsteinGranfortuna, James M, MD  Relative Name and Phone Number:       Current Level of Care: Hospital Recommended Level of Care: Skilled Nursing Facility Prior Approval Number:    Date Approved/Denied:   PASRR Number: [id number]  Discharge Plan: SNF    Current Diagnoses: Patient Active Problem List   Diagnosis Date Noted  . S/P transmetatarsal amputation of foot, left (HCC)   . Frostbite 11/24/2017  . Cellulitis 11/24/2017  . Depression 02/02/2017  . Insomnia 02/02/2017  . Protein-calorie malnutrition, severe 01/24/2017  . Urinary tract infection without hematuria   . Psychosis (HCC) 12/13/2016  . Weight loss 12/13/2016  . Abnormal ECG 12/13/2016  . Iron overload 12/13/2016  . Elevated bilirubin 12/13/2016  . PSA elevation 12/13/2016  . Diabetes mellitus (HCC) 12/12/2016  . Tobacco abuse 10/21/2015  . Hyperglycemia 10/21/2015  . Urinary frequency 10/21/2015  . RUQ abdominal pain 10/21/2015  . Hypertension 03/03/2014  . Hyponatremia 03/03/2014  . Chest pain 03/03/2014  . Leukocytosis 03/03/2014  . Urinary retention 03/03/2014    Orientation RESPIRATION BLADDER Height & Weight     Self, Time, Situation, Place  Normal Continent Weight: 65.4 kg (144 lb 2.9 oz) Height:  5\' 11"  (180.3 cm)  BEHAVIORAL SYMPTOMS/MOOD NEUROLOGICAL BOWEL NUTRITION STATUS      Continent Diet(Please see DC Summary)  AMBULATORY STATUS COMMUNICATION OF NEEDS Skin   Supervision Verbally Surgical wounds(Closed incision on leg)                       Personal Care Assistance Level of Assistance   Bathing, Feeding, Dressing Bathing Assistance: Limited assistance Feeding assistance: Independent Dressing Assistance: Independent     Functional Limitations Info             SPECIAL CARE FACTORS FREQUENCY  PT (By licensed PT)     PT Frequency: 3x/week              Contractures      Additional Factors Info  Code Status, Allergies, Psychotropic, Insulin Sliding Scale Code Status Info: Full Allergies Info: NKA Psychotropic Info: Seroquel Insulin Sliding Scale Info: 3x daily with meals       Current Medications (12/01/2017):  This is the current hospital active medication list Current Facility-Administered Medications  Medication Dose Route Frequency Provider Last Rate Last Dose  . acetaminophen (TYLENOL) tablet 650 mg  650 mg Oral Q6H PRN Toney RakesLacroce, Samantha J, MD   650 mg at 11/30/17 2035  . enoxaparin (LOVENOX) injection 40 mg  40 mg Subcutaneous Q24H Scherrie GerlachHuang, Jennifer, MD   40 mg at 11/30/17 1829  . insulin aspart (novoLOG) injection 0-9 Units  0-9 Units Subcutaneous TID WC Burna CashSantos-Sanchez, Idalys, MD   1 Units at 11/28/17 1303  . lactulose (CHRONULAC) 10 GM/15ML solution 20 g  20 g Oral Daily PRN Angelita InglesWinfrey, William B, MD   20 g at 11/28/17 2137  . lisinopril (PRINIVIL,ZESTRIL) tablet 10 mg  10 mg Oral Daily Eulah PontBlum, Nina, MD   10 mg at 11/30/17 0827  . naloxone [facility](NARCAN) injection 0.4 mg  0.4 mg Intravenous PRN  Toney Rakes, MD      . nicotine (NICODERM CQ - dosed in mg/24 hours) patch 14 mg  14 mg Transdermal Daily Eulah Pont, MD   14 mg at 11/30/17 1610  . oxyCODONE (Oxy IR/ROXICODONE) immediate release tablet 5 mg  5 mg Oral Q8H PRN Scherrie Gerlach, MD      . QUEtiapine (SEROQUEL) tablet 50 mg  50 mg Oral QHS Thedore Mins, MD   50 mg at 11/30/17 2225     Discharge Medications: Please see discharge summary for a list of discharge medications.  Relevant Imaging Results:  Relevant Lab Results:   Additional Information SSN: [id number] [place],  [place]

## 2017-12-01 NOTE — Progress Notes (Signed)
Subjective:  Mr. Mark Graham was seen resting in bed comfortably this morning. Denies fevers, chills, or chest pain. Reports the pain in his left lower extremity is improved. Amenable to discharge to SNF for short term recovery.  Objective:  Vital signs in last 24 hours: Vitals:   11/30/17 1621 11/30/17 2135 11/30/17 2135 12/01/17 0530  BP: 121/80 132/80 132/80 139/85  Pulse: 68 65 65 84  Resp: 18 18 18 18   Temp: 98.3 F (36.8 C) 97.8 F (36.6 C) 97.8 F (36.6 C) 98.1 F (36.7 C)  TempSrc: Oral Oral    SpO2: 98% 97% 97% 99%  Weight:      Height:       GEN: Well-nourished. Alert and oriented. No acute distress. RESP: Clear to auscultation bilaterally. No wheezes or rhonchi. No increased work of breathing. CV: Normal rate and regular rhythm. No murmurs, gallops, or rubs. No LE edema. ABD: Soft, NT, ND, +BS EXT: No edema. Warm. L foot with ACE wrap. Able to move LLE. No TTP of BLE. RLE with 2+ DP pulse, feels warm and without evidence of lesions or ulcers. NEURO: Cranial nerves II-XII grossly intact. Able to lift all four extremities against gravity. No apparent audiovisual hallucinations. Speech fluent and nonsensical at times. PSYCH: Patient is calm and pleasant. Mood is appropriate. Speech fluent and appropriate. Answers questions appropriately.  Assessment/Plan:  Mr. Frasier is a 62yo male with PMH of T2DM, HTN, and tobacco use disorder who presents with blackening of the toes on his left foot and autoamputation of L 2nd-5th toes. 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 S/p L transmetatarsal amputation 1/31 and 2 days of cephalexin, subsequently discontinued Continues to be afebrile  for >48h, repeat BCx 2/3 NGTD. CBC without leukocytosis.  Stable vital signs. Patient denies chest pain, shortness of breath, or fevers/chills. - Continue to monitor fever curve - Continue PO oxy 5mg  q8h PRN - Narcan prn - PT/OT recommending SNF - F/u ortho outpatient ~ 2 weeks - Discharge to SNF today for continued rehab  HTN Home regimen includes amlodipine 5mg  daily, but he has not taken medications for the past 6 months or more. BP 139/85 this AM. Started lisinopril this admission. -Continuelisinopril 10mg  qday, can consider increasing to 20mg  daily if BP remains elevated - CM consulted to help obtain medications  T2DM A1c 7.2 in 11/2016. On metformin 500mg  BID at home, but states he has not taken any medications for the past 6 months or more. - SSI-S - CBG monitoring  Tobacco use disorder Current smoker, 1ppd+.  - Nicotine patch 14mg  qday  BPH Home regimen includes tamsulosin 0.8mg  daily and finasteride 5mg  daily. Unsure if patient was taking these medications.  Depression Insomnia Home regimen includes trazodone 100mg  QHS PRN and fluoxetine 20mg  daily. Unsure if patient was taking these medications.  - Continue seroquel 50mg  QHS for sleep  Unsuitable home environment Patient describes that his home is unsuitable for home PT to come to. I am concerned that he may either be homeless or living in an unsafe environment. Discussed with SW and CM. Patient has been approved for LOG and accepted at [facility] - Discharge medications sent to [facility] pharmacy - SW  and CM consulted; appreciate their assistance in this very difficult situation - Discharge to SNF today  Dispo: Anticipated discharge today  Scherrie GerlachHuang, Vinay Ertl, MD 12/01/2017, 9:23 AM Pager: [phone]

## 2017-12-01 NOTE — Progress Notes (Signed)
Deberah CastleJerry D Mauck to be D/C'd Skilled nursing facility per MD order.  Discussed with the patient and all questions fully answered.  VSS, Skin clean, dry and intact without evidence of skin break down, no evidence of skin tears noted. IV catheter discontinued intact. Site without signs and symptoms of complications. Dressing and pressure applied.  An After Visit Summary was printed and given to the patient. Patient received prescription.  D/c education completed with patient/family including follow up instructions, medication list, d/c activities limitations if indicated, with other d/c instructions as indicated by MD - patient able to verbalize understanding, all questions fully answered.   Patient instructed to return to ED, call 911, or call MD for any changes in condition.   Patient escorted via WC, and D/C home via private auto.  Eligah Eastrin M Michole Lecuyer 12/01/2017 5:10 PM

## 2017-12-02 ENCOUNTER — Telehealth: Payer: Self-pay | Admitting: Family Medicine

## 2017-12-02 NOTE — Telephone Encounter (Signed)
Call placed to [facility] #[phone] to check if patient was admitted in the facility and to find out his discharge date. Spoke with Juliette and she informed me that patient had be admitted earlier this morning. Juliette didn't have any information about patient's discharge since it was too soon but she transferred me to facility Social worker, Lawerance CruelStarr. Spoke with Lawerance CruelStarr and she said that she didn't have any information on patient yet but she took my and Jane's (Sports coachcase manager) information [phone] and she will contact us when she has an update.

## 2017-12-03 LAB — CULTURE, BLOOD (ROUTINE X 2)
CULTURE: NO GROWTH
Culture: NO GROWTH
Special Requests: ADEQUATE

## 2017-12-06 ENCOUNTER — Inpatient Hospital Stay: Payer: Self-pay | Admitting: Family Medicine

## 2017-12-06 ENCOUNTER — Telehealth: Payer: Self-pay

## 2017-12-06 NOTE — Telephone Encounter (Signed)
The patient came to the clinic today accompanied by his caregiver,  Ms Jenelle MagesHairston, from [facility] who stated that he was here for his appointment.  Informed her that the appointment was cancelled when he was admitted to the facility and a new appointment can be scheduled when he is ready for discharge home as the facility provider is managing his care at this time.

## 2017-12-10 ENCOUNTER — Ambulatory Visit: Payer: Self-pay | Admitting: Family Medicine

## 2017-12-10 ENCOUNTER — Telehealth: Payer: Self-pay | Admitting: Family Medicine

## 2017-12-10 NOTE — Telephone Encounter (Signed)
Call placed to [facility] #[phone] regarding patient's appointment and discharge. Spoke with Lawerance CruelStarr (Child psychotherapistsocial worker) and explained her that patient had come to the office earlier in the week with caregiver Ms Jenelle MagesHairston, for his follow up appointment but we had cancelled it since patient was admitted to [facility]. Lawerance CruelStarr asked me who Ms. Jenelle MagesHairston was? She didn't seem to recognize her name. I stated that that was the name that was written on the notes and our case manager, Erskine SquibbJane, had spoken to her and explained  that the reason why appointment was cancelled was because patient had been admitted to facility and would be seen by one of their providers. Lawerance CruelStarr did confirm that patient was seen by one of their providers. She also stated that patient will be discharged in about a week after she completes the house evaluation.

## 2017-12-21 ENCOUNTER — Telehealth: Payer: Self-pay | Admitting: Family Medicine

## 2017-12-21 NOTE — Telephone Encounter (Signed)
Call placed to [facility] #[phone] regarding patient's status and getting an update on the plans following patient's discharge. Spoke with Audria NineSondria and she informed me that Star (Child psychotherapistsocial worker) was in a meeting. Left message with Audria NineSondria asking Lawerance CruelStarr to return my call #[phone]. Mentioned that she can speak with Erskine SquibbJane (Sports coachcase manager) as well.

## 2017-12-23 ENCOUNTER — Telehealth: Payer: Self-pay | Admitting: Family Medicine

## 2017-12-23 NOTE — Telephone Encounter (Signed)
Call placed to [facility] #[phone] regarding patient's status and getting an update on the plans following patient's discharge. Was transferred [employer] Smith's (Child psychotherapistsocial worker) extension. No answer. Left Star a message asking her to return my call at [phone]. If she returns the call, she can also speak with Erskine SquibbJane (Sports coachcase manager).

## 2017-12-23 NOTE — Telephone Encounter (Signed)
Mark Graham (Child psychotherapistsocial worker) from [facility] returned my call. She informed me that home evaluation was completed and patient received a follow up appointment (at facility). Patient hasn't been discharged yet but will be in the days to come, after he completes the resident education class/course.

## 2017-12-30 ENCOUNTER — Telehealth: Payer: Self-pay | Admitting: Family Medicine

## 2017-12-30 NOTE — Telephone Encounter (Signed)
Patient was discharged [facility].

## 2017-12-31 ENCOUNTER — Ambulatory Visit: Payer: Self-pay | Admitting: Family Medicine

## 2017-12-31 ENCOUNTER — Telehealth: Payer: Self-pay

## 2017-12-31 NOTE — Telephone Encounter (Signed)
Transitional Care Clinic Post-discharge Follow-Up Phone Call:  Date of Discharge: 12/30/2017 - from [facility] Principal Discharge Diagnosis(es):s/p transmetatarsal amputation of left foot, DM, HTN, s/p frostbite.  Post-discharge Communication: (Clearly document all attempts clearly and date contact made) call placed to the patient. He is known to [facility] at [facility] and is agreeable to follow up again with the TCC Call Completed: Yes                     With Whom: patient Interpreter Needed: No    Please check all that apply:  X  Patient is knowledgeable of his/her condition(s) and/or treatment. X  Patient is caring for self at home.  ? Patient is receiving assist at home from family and/or caregiver. Family and/or caregiver is knowledgeable of patient's condition(s) and/or treatment. ? Patient is receiving home health services. If so, name of agency.     Medication Reconciliation:  ? Medication list reviewed with patient. X  Patient obtained all discharge medications. If not, why? - he stated that he does not have all of his medications. He has a new prescription that he was given at discharge from [facility] but he needs to find it. He said that he thinks he has paperwork from the facility but he needs to find that too. He said that he is not sure that he can get his medications filled until 01/04/18. He noted that he has a glucometer but he has not been using it but should be using it.    Activities of Daily Living:  X  Independent X  Needs assist (describe; ? home DME used) - he said that he has a RW but has not been using it.  He explained that he is used to walking everywhere and this is frustrating for him as his mobility is limited, . He said that he has enough food at home and the grocery store is a block away. He noted that he still has to change the dressing on his left foot daily. He said he was given dressing supplies ( gauze). He reported no drainage/ bleeding from the  surgical site.  ? [employer] (describe, ? home DME used)   Community resources in place for patient:  X  None  - he explained that he just received a $7000 bill from the hospital for the month of March 2019 and he was not in the hospital in March.  Explained to him that he can meet with Fay Recordsiane Boyd. [facility] Artistinancial Counselor to discuss [facility] [employer] Financial Assistance.  ? Home Health/Home DME ? Assisted Living ? Support Group        Questions/Concerns discussed: [facility] was changed to 01/12/18 @ 1400. Will call him if there is a cancellation that would allow him to be seen [facility] before 01/12/18

## 2018-01-04 ENCOUNTER — Telehealth: Payer: Self-pay

## 2018-01-04 NOTE — Telephone Encounter (Signed)
Call placed to check on the patient. He stated that he is " doing okay."  He explained that he has been changing the dressing on his left foot. Last changed yesterday. He noted that the wound is healing, no drainage or signs or infection noted. He stated that he is also watching his right foot for any sign of infection. He stated that there are currently no open areas or signs of infection on his right foot.   He also noted that he does not have all of his medications yet. Stressed with him the importance of having someone pick up his medications for him and also finding the discharge paperwork from [facility] so he can make sure that he is taking the medications as ordered. He stated that he understood then quickly changed the subject to the Bible.  Also reminded him that [facility] and someone [facility] will call him prior to then and arrange cab transportation if needed.

## 2018-01-11 ENCOUNTER — Telehealth: Payer: Self-pay

## 2018-01-11 NOTE — Telephone Encounter (Signed)
Attempted to contact the patient to confirm his appointment for tomorrow and to arrange cab transportation If needed. Call placed to # [phone] (M), voicemail message left requesting a call back to # [phone]

## 2018-01-12 ENCOUNTER — Inpatient Hospital Stay: Payer: Self-pay | Admitting: Family Medicine

## 2018-01-13 ENCOUNTER — Other Ambulatory Visit: Payer: Self-pay

## 2018-01-13 ENCOUNTER — Inpatient Hospital Stay (HOSPITAL_COMMUNITY)
Admission: EM | Admit: 2018-01-13 | Discharge: 2018-01-17 | DRG: 475 | Disposition: A | Discharge status: Home / Self Care | Payer: Self-pay | Attending: Internal Medicine | Admitting: Internal Medicine

## 2018-01-13 ENCOUNTER — Encounter (HOSPITAL_COMMUNITY): Payer: Self-pay

## 2018-01-13 ENCOUNTER — Emergency Department (HOSPITAL_COMMUNITY): Payer: Self-pay

## 2018-01-13 ENCOUNTER — Emergency Department (HOSPITAL_BASED_OUTPATIENT_CLINIC_OR_DEPARTMENT_OTHER): Admit: 2018-01-13 | Discharge: 2018-01-13 | Disposition: A | Discharge status: Home / Self Care | Payer: Self-pay

## 2018-01-13 DIAGNOSIS — Z8611 Personal history of tuberculosis: Secondary | ICD-10-CM

## 2018-01-13 DIAGNOSIS — T8781 Dehiscence of amputation stump: Secondary | ICD-10-CM | POA: Diagnosis present

## 2018-01-13 DIAGNOSIS — D638 Anemia in other chronic diseases classified elsewhere: Secondary | ICD-10-CM | POA: Diagnosis present

## 2018-01-13 DIAGNOSIS — M869 Osteomyelitis, unspecified: Secondary | ICD-10-CM | POA: Diagnosis present

## 2018-01-13 DIAGNOSIS — F17210 Nicotine dependence, cigarettes, uncomplicated: Secondary | ICD-10-CM | POA: Diagnosis present

## 2018-01-13 DIAGNOSIS — E1169 Type 2 diabetes mellitus with other specified complication: Secondary | ICD-10-CM | POA: Diagnosis present

## 2018-01-13 DIAGNOSIS — T8744 Infection of amputation stump, left lower extremity: Principal | ICD-10-CM | POA: Diagnosis present

## 2018-01-13 DIAGNOSIS — N390 Urinary tract infection, site not specified: Secondary | ICD-10-CM | POA: Diagnosis present

## 2018-01-13 DIAGNOSIS — D649 Anemia, unspecified: Secondary | ICD-10-CM | POA: Diagnosis present

## 2018-01-13 DIAGNOSIS — L03116 Cellulitis of left lower limb: Secondary | ICD-10-CM | POA: Diagnosis present

## 2018-01-13 DIAGNOSIS — I10 Essential (primary) hypertension: Secondary | ICD-10-CM | POA: Diagnosis present

## 2018-01-13 DIAGNOSIS — E785 Hyperlipidemia, unspecified: Secondary | ICD-10-CM | POA: Diagnosis present

## 2018-01-13 DIAGNOSIS — Z7984 Long term (current) use of oral hypoglycemic drugs: Secondary | ICD-10-CM

## 2018-01-13 DIAGNOSIS — Z833 Family history of diabetes mellitus: Secondary | ICD-10-CM

## 2018-01-13 DIAGNOSIS — E1140 Type 2 diabetes mellitus with diabetic neuropathy, unspecified: Secondary | ICD-10-CM | POA: Diagnosis present

## 2018-01-13 DIAGNOSIS — M79609 Pain in unspecified limb: Secondary | ICD-10-CM

## 2018-01-13 DIAGNOSIS — F329 Major depressive disorder, single episode, unspecified: Secondary | ICD-10-CM | POA: Diagnosis present

## 2018-01-13 DIAGNOSIS — L089 Local infection of the skin and subcutaneous tissue, unspecified: Secondary | ICD-10-CM

## 2018-01-13 DIAGNOSIS — Z89432 Acquired absence of left foot: Secondary | ICD-10-CM

## 2018-01-13 DIAGNOSIS — E119 Type 2 diabetes mellitus without complications: Secondary | ICD-10-CM

## 2018-01-13 LAB — COMPREHENSIVE METABOLIC PANEL
ALK PHOS: 89 U/L (ref 38–126)
ALT: 13 U/L — ABNORMAL LOW (ref 17–63)
AST: 20 U/L (ref 15–41)
Albumin: 3.6 g/dL (ref 3.5–5.0)
Anion gap: 8 (ref 5–15)
BILIRUBIN TOTAL: 0.4 mg/dL (ref 0.3–1.2)
BUN: 7 mg/dL (ref 6–20)
CALCIUM: 8.8 mg/dL — AB (ref 8.9–10.3)
CO2: 24 mmol/L (ref 22–32)
Chloride: 103 mmol/L (ref 101–111)
Creatinine, Ser: 1.05 mg/dL (ref 0.61–1.24)
GFR calc Af Amer: >60 mL/min (ref >60)
Glucose, Bld: 178 mg/dL — ABNORMAL HIGH (ref 65–99)
POTASSIUM: 3.6 mmol/L (ref 3.5–5.1)
Sodium: 135 mmol/L (ref 135–145)
TOTAL PROTEIN: 6.5 g/dL (ref 6.5–8.1)

## 2018-01-13 LAB — CBC WITH DIFFERENTIAL/PLATELET
BASOS ABS: 0 10*3/uL (ref 0.0–0.1)
BASOS PCT: 0 %
EOS ABS: 0.1 10*3/uL (ref 0.0–0.7)
EOS PCT: 2 %
HCT: 32 % — ABNORMAL LOW (ref 39.0–52.0)
Hemoglobin: 10.8 g/dL — ABNORMAL LOW (ref 13.0–17.0)
LYMPHS PCT: 27 %
Lymphs Abs: 2.1 10*3/uL (ref 0.7–4.0)
MCH: 32.2 pg (ref 26.0–34.0)
MCHC: 33.8 g/dL (ref 30.0–36.0)
MCV: 95.5 fL (ref 78.0–100.0)
Monocytes Absolute: 0.7 10*3/uL (ref 0.1–1.0)
Monocytes Relative: 9 %
Neutro Abs: 4.9 10*3/uL (ref 1.7–7.7)
Neutrophils Relative %: 62 %
PLATELETS: 269 10*3/uL (ref 150–400)
RBC: 3.35 MIL/uL — AB (ref 4.22–5.81)
RDW: 15.1 % (ref 11.5–15.5)
WBC: 7.9 10*3/uL (ref 4.0–10.5)

## 2018-01-13 LAB — I-STAT CG4 LACTIC ACID, ED: Lactic Acid, Venous: 1.89 mmol/L (ref 0.5–1.9)

## 2018-01-13 MED ORDER — PIPERACILLIN-TAZOBACTAM 3.375 G IVPB
3.3750 g | Freq: Three times a day (TID) | INTRAVENOUS | Status: DC
  Administered 2018-01-14 – 2018-01-17 (×10): 3.375 g via INTRAVENOUS
  Filled 2018-01-13 (×12): qty 50

## 2018-01-13 MED ORDER — PIPERACILLIN-TAZOBACTAM 3.375 G IVPB 30 MIN
3.3750 g | Freq: Once | INTRAVENOUS | Status: AC
  Administered 2018-01-14: 3.375 g via INTRAVENOUS
  Filled 2018-01-13: qty 50

## 2018-01-13 MED ORDER — VANCOMYCIN HCL IN DEXTROSE 750-5 MG/150ML-% IV SOLN
750.0000 mg | Freq: Two times a day (BID) | INTRAVENOUS | Status: DC
  Administered 2018-01-14 – 2018-01-17 (×6): 750 mg via INTRAVENOUS
  Filled 2018-01-13 (×9): qty 150

## 2018-01-13 MED ORDER — HYDROCODONE-ACETAMINOPHEN 5-325 MG PO TABS
1.0000 | ORAL_TABLET | Freq: Once | ORAL | Status: AC
  Administered 2018-01-13: 1 via ORAL
  Filled 2018-01-13: qty 1

## 2018-01-13 MED ORDER — SODIUM CHLORIDE 0.9 % IV SOLN
1500.0000 mg | Freq: Once | INTRAVENOUS | Status: AC
  Administered 2018-01-14: 1500 mg via INTRAVENOUS
  Filled 2018-01-13: qty 1500

## 2018-01-13 NOTE — ED Triage Notes (Signed)
Pt presents to the ed with complaints of having an infection in his left foot. States that he had his toes amputated in January. States that there is a foul odor and wound at the site.

## 2018-01-13 NOTE — Progress Notes (Signed)
Left lower extremity venous duplex has been completed. Negative for DVT. Results were given to Mathews RobinsonsJessica Mitchell PA.  01/13/18 5:09 PM Olen CordialGreg Patrizia Paule RVT

## 2018-01-13 NOTE — ED Provider Notes (Signed)
Patient placed in Quick Look pathway, seen and evaluated   Chief Complaint: LE edema, purulence, edema, pain  HPI:   Patient presenting with left lower foot edema, erythema, wound dehiscence and purulence with foul smell and is unable to clearly state when this all started.  Patient is status post toe amputation November 25, 2017.   ROS: Patient denies any fever, chills or other symptoms.  Physical Exam:   Gen: No distress  Neuro: Awake and Alert  Skin: Warm    Focused Exam: 2+ pitting edema, erythema, warmth, wound dehiscence and purulence with foul odor.  No calf pain.  Is afebrile nontoxic appearing.    Initiation of care has begun. The patient has been counseled on the process, plan, and necessity for staying for the completion/evaluation, and the remainder of the medical screening examination    Gregary CromerMitchell, Ajanee Buren B, PA-C 01/13/18 1628    LongArlyss Repress, Joshua G, MD 01/13/18 2140

## 2018-01-13 NOTE — Progress Notes (Signed)
Cellulitis and probable recurrent osteomyelitis s/p transmet amputation on 11/25/17  Dr. Doreatha Martin to reevaluate in the am. Appt had been scheduled for next week.  Will recheck ESR and CRP.  Altamese Wakita, MD [facility] [phone] (p)

## 2018-01-13 NOTE — Progress Notes (Signed)
Pharmacy Antibiotic Note  Mark Graham is a 62 y.o. male admitted on 01/13/2018 with cellulitis and probable recurrent osteomyelitis.  Pharmacy has been consulted for Vancocin and Zosyn dosing.  Plan: Vancomycin 1500mg  x1 then 750mg  IV every 12 hours.  Goal trough 10-15 mcg/mL. Zosyn 3.375g IV q8h (4 hour infusion).  Weight: 144 lb (65.3 kg)  Temp (24hrs), Avg:98.5 F (36.9 C), Min:98.3 F (36.8 C), Max:98.6 F (37 C)  Recent Labs  Lab 01/13/18 1623 01/13/18 1714  WBC 7.9  --   CREATININE 1.05  --   LATICACIDVEN  --  1.89    Estimated Creatinine Clearance: 68.2 mL/min (by C-G formula based on SCr of 1.05 mg/dL).    No Known Allergies   Thank you for allowing pharmacy to be a part of this patient's care.  Mark Graham, PharmD, BCPS  01/13/2018 11:57 PM

## 2018-01-13 NOTE — ED Provider Notes (Signed)
[name] EMERGENCY DEPARTMENT Provider Note   CSN: [id number] Arrival date & time: 01/13/18  1521     History   Chief Complaint Chief Complaint  Patient presents with  . foot infection    HPI Mark Graham is a 62 y.o. male with a h/o of DM Type II, left foot osteomyelitis s/p transmetatarsal amputation, HTN, hypercholesteremia, and tobacco abuse who presents to the emergency department with a chief complaint of constant, moderate, worsening pain, redness, and swelling to the left foot.  Unable to state how long this has been going on.  States that he came for evaluation today because is the first day he has had a ride to the hospital because his sister was able to take him.  He is concerned that this all started because he wrapped the dressings too tightly.  He underwent left transmetatarsal amputation on 11/24/17 with Dr. Jena Gauss.  Patient is concerned that maybe he walked on it too much.  It is worse with weightbearing and alleviated with nonweightbearing.  Denies fever, chills, or right foot complaints.  He is a poor historian.  The history is provided by the patient. No language interpreter was used.    Past Medical History:  Diagnosis Date  . Arthritis    "probably in my hands" (11/24/2017)  . Cellulitis of left foot 11/24/2017  . High cholesterol   . Hypertension   . Osteomyelitis of left foot (HCC)    Osteomyelitis of left hallux with overlying cellulitis left foot/notes 11/24/2017  . Tobacco abuse   . Tuberculosis ~ 1990   "had tx for it"  . Type II diabetes mellitus [facility])     Patient Active Problem List   Diagnosis Date Noted  . Diabetes mellitus type 2 in nonobese (HCC) 01/14/2018  . Normochromic normocytic anemia 01/14/2018  . Osteomyelitis of left foot (HCC) 01/14/2018  . Foot osteomyelitis, left (HCC) 01/14/2018  . S/P transmetatarsal amputation of foot, left (HCC)   . Frostbite 11/24/2017  . Cellulitis 11/24/2017  . Depression  02/02/2017  . Insomnia 02/02/2017  . Protein-calorie malnutrition, severe 01/24/2017  . Urinary tract infection without hematuria   . Psychosis (HCC) 12/13/2016  . Weight loss 12/13/2016  . Abnormal ECG 12/13/2016  . Iron overload 12/13/2016  . Elevated bilirubin 12/13/2016  . PSA elevation 12/13/2016  . Diabetes mellitus (HCC) 12/12/2016  . Tobacco abuse 10/21/2015  . Hyperglycemia 10/21/2015  . Urinary frequency 10/21/2015  . RUQ abdominal pain 10/21/2015  . Hypertension 03/03/2014  . Hyponatremia 03/03/2014  . Chest pain 03/03/2014  . Leukocytosis 03/03/2014  . Urinary retention 03/03/2014    Past Surgical History:  Procedure Laterality Date  . AMPUTATION Left 11/25/2017   Procedure: TRANSMETATARSAL AMPUTATION;  Surgeon: Roby Lofts, MD;  Location: [facility] OR;  Service: Orthopedics;  Laterality: Left;  . NO PAST SURGERIES         Home Medications    Prior to Admission medications   Medication Sig Start Date End Date Taking? Authorizing Provider  atorvastatin (LIPITOR) 20 MG tablet Take 1 tablet (20 mg total) by mouth daily at 6 PM. 11/29/17  Yes Scherrie Gerlach, MD  lisinopril (PRINIVIL,ZESTRIL) 10 MG tablet Take 1 tablet (10 mg total) by mouth daily. 11/30/17  Yes Scherrie Gerlach, MD  metFORMIN (GLUCOPHAGE) 500 MG tablet Take 1 tablet (500 mg total) by mouth 2 (two) times daily with a meal. 11/29/17  Yes Scherrie Gerlach, MD  QUEtiapine (SEROQUEL) 50 MG tablet Take 1 tablet (50 mg total)  by mouth at bedtime. 11/29/17  Yes Scherrie Gerlach, MD  Blood Glucose Monitoring Suppl (TRUE METRIX METER) DEVI 1 each by Does not apply route daily. Patient not taking: Reported on 01/13/2018 11/29/17   Scherrie Gerlach, MD    Family History Family History  Problem Relation Age of Onset  . Cancer Father   . Cancer Sister   . Diabetes Mellitus II Unknown        States 6 out of 13 of his siblings     Social History Social History   Tobacco Use  . Smoking status: Current Every Day Smoker      Packs/day: 1.00    Years: 46.00    Pack years: 46.00    Types: Cigarettes  . Smokeless tobacco: Former Neurosurgeon    Types: Chew  Substance Use Topics  . Alcohol use: Yes    Alcohol/week: 3.6 oz    Types: 6 Cans of beer per week  . Drug use: Yes    Types: Marijuana    Comment: 11/24/2017 "nothing since ~ 2002"     Allergies   Patient has no known allergies.   Review of Systems Review of Systems  Constitutional: Negative for appetite change, chills and fever.  Respiratory: Negative for shortness of breath.   Cardiovascular: Negative for chest pain.  Gastrointestinal: Negative for abdominal pain, diarrhea, nausea and vomiting.  Genitourinary: Negative for dysuria.  Musculoskeletal: Positive for arthralgias, gait problem and myalgias. Negative for back pain.  Skin: Positive for wound. Negative for rash.  Allergic/Immunologic: Negative for immunocompromised state.  Neurological: Negative for headaches.  Psychiatric/Behavioral: Negative for confusion.     Physical Exam Updated Vital Signs BP (!) 130/91   Pulse 68   Temp 98.6 F (37 C)   Resp 16   Wt 65.3 kg (144 lb)   SpO2 94%   BMI 20.08 kg/m   Physical Exam  Constitutional: He appears well-developed.  HENT:  Head: Normocephalic.  Eyes: Conjunctivae are normal.  Neck: Neck supple.  Cardiovascular: Normal rate, regular rhythm, normal heart sounds and intact distal pulses. Exam reveals no gallop and no friction rub.  No murmur heard. Pulmonary/Chest: Effort normal and breath sounds normal. No stridor. No respiratory distress. He has no wheezes. He has no rales.  Abdominal: Soft. He exhibits no distension.  Musculoskeletal: He exhibits edema and tenderness.  Trans-metatarsal amputation of the left foot.  Stump is warm, erythematous, and edematous.  Purulent drainage is easily expressed from the amputation incision.  The area is moderately tender to palpation.   Right foot is well-appearing without erythema, edema, or  warmth.   Neurological: He is alert.  Skin: Skin is warm and dry.  Psychiatric: His behavior is normal.  Nursing note and vitals reviewed.  ED Treatments / Results  Labs (all labs ordered are listed, but only abnormal results are displayed) Labs Reviewed  COMPREHENSIVE METABOLIC PANEL - Abnormal; Notable for the following components:      Result Value   Glucose, Bld 178 (*)    Calcium 8.8 (*)    ALT 13 (*)    All other components within normal limits  CBC WITH DIFFERENTIAL/PLATELET - Abnormal; Notable for the following components:   RBC 3.35 (*)    Hemoglobin 10.8 (*)    HCT 32.0 (*)    All other components within normal limits  URINALYSIS, ROUTINE W REFLEX MICROSCOPIC - Abnormal; Notable for the following components:   APPearance HAZY (*)    Hgb urine dipstick SMALL (*)  Leukocytes, UA LARGE (*)    Bacteria, UA MANY (*)    Squamous Epithelial / LPF 0-5 (*)    All other components within normal limits  C-REACTIVE PROTEIN - Abnormal; Notable for the following components:   CRP 1.8 (*)    All other components within normal limits  AEROBIC CULTURE (SUPERFICIAL SPECIMEN)  CULTURE, BLOOD (ROUTINE X 2)  CULTURE, BLOOD (ROUTINE X 2)  SEDIMENTATION RATE  BASIC METABOLIC PANEL  CBC  I-STAT CG4 LACTIC ACID, ED    EKG  EKG Interpretation None       Radiology Dg Foot Complete Left  Result Date: 01/13/2018 CLINICAL DATA:  Left foot wound dehiscence following amputation. Edema and erythema. EXAM: LEFT FOOT - COMPLETE 3+ VIEW COMPARISON:  11/25/2017. FINDINGS: Again demonstrated are post amputation changes at the level of the proximal metatarsals. Interval mild bone destruction involving the distal aspect of the remaining portion of the 1st metatarsal. There is also interval mild periosteal new bone formation involving the distal aspect of the remaining portion of the 5th metatarsal. Diffuse soft tissue swelling with no soft tissue gas. IMPRESSION: Findings suspicious for the  possibility of osteomyelitis involving the remaining portion of the 1st metatarsal and 5th metatarsal. Electronically Signed   By: Beckie Salts M.D.   On: 01/13/2018 17:35    Procedures Procedures (including critical care time)  Medications Ordered in ED Medications  vancomycin (VANCOCIN) 1,500 mg in sodium chloride 0.9 % 500 mL IVPB (1,500 mg Intravenous Transfusing/Transfer 01/14/18 0214)  vancomycin (VANCOCIN) IVPB 750 mg/150 ml premix (has no administration in time range)  piperacillin-tazobactam (ZOSYN) IVPB 3.375 g (has no administration in time range)  atorvastatin (LIPITOR) tablet 20 mg (has no administration in time range)  lisinopril (PRINIVIL,ZESTRIL) tablet 10 mg (has no administration in time range)  QUEtiapine (SEROQUEL) tablet 50 mg (has no administration in time range)  acetaminophen (TYLENOL) tablet 650 mg (has no administration in time range)    Or  acetaminophen (TYLENOL) suppository 650 mg (has no administration in time range)  ondansetron (ZOFRAN) tablet 4 mg (has no administration in time range)    Or  ondansetron (ZOFRAN) injection 4 mg (has no administration in time range)  insulin aspart (novoLOG) injection 0-9 Units (has no administration in time range)  HYDROcodone-acetaminophen (NORCO/VICODIN) 5-325 MG per tablet 1 tablet (1 tablet Oral Given 01/13/18 2135)  piperacillin-tazobactam (ZOSYN) IVPB 3.375 g (0 g Intravenous Stopped 01/14/18 0118)     Initial Impression / Assessment and Plan / ED Course  I have reviewed the triage vital signs and the nursing notes.  Pertinent labs & imaging results that were available during my care of the patient were reviewed by me and considered in my medical decision making (see chart for details).     62 year old male with a h/o of DM Type II, left foot osteomyelitis s/p transmetatarsal amputation, HTN, hypercholesteremia, and tobacco abuse presenting with worsening left foot redness, pain, and swelling with purulent  discharge.  Denies constitutional symptoms.  The patient is a poor historian.  No leukocytosis on CBC.  Glucose of 178 on CMP, but otherwise unremarkable. Left foot x-ray concerning for possibility of osteomyelitis involving the remaining portion of the first and fifth metatarsals.  There is diffuse soft tissue swelling with no gas.  Spoke with Dr. Carola Frost, orthopedic surgeon who recommended medical admission and orthopedics team will follow and see the patient tomorrow to determine if antibiotic versus return to the operating room is warranted.  Recommended making the patient n.p.o.  after midnight. Wound culture from left foot incision sent for culture.  Blood cultures x2 collected prior to starting vancomycin and Zosyn in the ED.  Dr. Toniann FailKakrakandy with the hospitalist team will admit the patient. The patient appears reasonably stabilized for admission considering the current resources, flow, and capabilities available in the ED at this time, and I doubt any other [facility] requiring further screening and/or treatment in the ED prior to admission.  Final Clinical Impressions(s) / ED Diagnoses   Final diagnoses:  Left foot infection    ED Discharge Orders    None       Ryley Teater A, PA-C 01/14/18 0217    Charlynne PanderYao, David Hsienta, MD 01/14/18 [phone]

## 2018-01-14 ENCOUNTER — Encounter (HOSPITAL_COMMUNITY): Payer: Self-pay | Admitting: Internal Medicine

## 2018-01-14 ENCOUNTER — Telehealth: Payer: Self-pay | Admitting: Family Medicine

## 2018-01-14 DIAGNOSIS — M86172 Other acute osteomyelitis, left ankle and foot: Secondary | ICD-10-CM

## 2018-01-14 DIAGNOSIS — I10 Essential (primary) hypertension: Secondary | ICD-10-CM

## 2018-01-14 DIAGNOSIS — M869 Osteomyelitis, unspecified: Secondary | ICD-10-CM | POA: Diagnosis present

## 2018-01-14 DIAGNOSIS — D649 Anemia, unspecified: Secondary | ICD-10-CM | POA: Diagnosis present

## 2018-01-14 DIAGNOSIS — E119 Type 2 diabetes mellitus without complications: Secondary | ICD-10-CM

## 2018-01-14 LAB — BASIC METABOLIC PANEL
ANION GAP: 9 (ref 5–15)
BUN: 7 mg/dL (ref 6–20)
CHLORIDE: 103 mmol/L (ref 101–111)
CO2: 25 mmol/L (ref 22–32)
CREATININE: 1.03 mg/dL (ref 0.61–1.24)
Calcium: 8.4 mg/dL — ABNORMAL LOW (ref 8.9–10.3)
GFR calc non Af Amer: >60 mL/min (ref >60)
Glucose, Bld: 144 mg/dL — ABNORMAL HIGH (ref 65–99)
POTASSIUM: 3.4 mmol/L — AB (ref 3.5–5.1)
Sodium: 137 mmol/L (ref 135–145)

## 2018-01-14 LAB — URINALYSIS, ROUTINE W REFLEX MICROSCOPIC
Bilirubin Urine: NEGATIVE
Glucose, UA: NEGATIVE mg/dL
KETONES UR: NEGATIVE mg/dL
Nitrite: NEGATIVE
PROTEIN: NEGATIVE mg/dL
Specific Gravity, Urine: 1.005 (ref 1.005–1.030)
pH: 6 (ref 5.0–8.0)

## 2018-01-14 LAB — CBC
HCT: 29.9 % — ABNORMAL LOW (ref 39.0–52.0)
HEMOGLOBIN: 9.8 g/dL — AB (ref 13.0–17.0)
MCH: 31.4 pg (ref 26.0–34.0)
MCHC: 32.8 g/dL (ref 30.0–36.0)
MCV: 95.8 fL (ref 78.0–100.0)
PLATELETS: 240 10*3/uL (ref 150–400)
RBC: 3.12 MIL/uL — AB (ref 4.22–5.81)
RDW: 15.4 % (ref 11.5–15.5)
WBC: 5.4 10*3/uL (ref 4.0–10.5)

## 2018-01-14 LAB — VITAMIN B12: VITAMIN B 12: 226 pg/mL (ref 180–914)

## 2018-01-14 LAB — IRON AND TIBC
Iron: 40 ug/dL — ABNORMAL LOW (ref 45–182)
Saturation Ratios: 20 % (ref 17.9–39.5)
TIBC: 203 ug/dL — AB (ref 250–450)
UIBC: 163 ug/dL

## 2018-01-14 LAB — RETICULOCYTES
RBC.: 3.12 MIL/uL — AB (ref 4.22–5.81)
RETIC CT PCT: 1.6 % (ref 0.4–3.1)
Retic Count, Absolute: 49.9 10*3/uL (ref 19.0–186.0)

## 2018-01-14 LAB — C-REACTIVE PROTEIN: CRP: 1.8 mg/dL — ABNORMAL HIGH (ref <1.0)

## 2018-01-14 LAB — GLUCOSE, CAPILLARY
GLUCOSE-CAPILLARY: 126 mg/dL — AB (ref 65–99)
GLUCOSE-CAPILLARY: 184 mg/dL — AB (ref 65–99)
Glucose-Capillary: 90 mg/dL (ref 65–99)
Glucose-Capillary: 88 mg/dL (ref 65–99)

## 2018-01-14 LAB — TYPE AND SCREEN
ABO/RH(D): O POS
Antibody Screen: NEGATIVE

## 2018-01-14 LAB — ABO/RH: ABO/RH(D): O POS

## 2018-01-14 LAB — FERRITIN: Ferritin: 107 ng/mL (ref 24–336)

## 2018-01-14 LAB — SEDIMENTATION RATE: Sed Rate: 28 mm/hr — ABNORMAL HIGH (ref 0–16)

## 2018-01-14 LAB — FOLATE: Folate: 14.2 ng/mL (ref >5.9)

## 2018-01-14 MED ORDER — ACETAMINOPHEN 650 MG RE SUPP: 650.0000 mg | Freq: Four times a day (QID) | RECTAL | Status: DC | PRN

## 2018-01-14 MED ORDER — ONDANSETRON HCL 4 MG PO TABS: 4.0000 mg | ORAL_TABLET | Freq: Four times a day (QID) | ORAL | Status: DC | PRN

## 2018-01-14 MED ORDER — QUETIAPINE FUMARATE 50 MG PO TABS
50.0000 mg | ORAL_TABLET | Freq: Every day | ORAL | Status: DC
  Administered 2018-01-14 – 2018-01-16 (×4): 50 mg via ORAL
  Filled 2018-01-14 (×4): qty 1

## 2018-01-14 MED ORDER — HYDROCODONE-ACETAMINOPHEN 7.5-325 MG PO TABS
1.0000 | ORAL_TABLET | Freq: Four times a day (QID) | ORAL | Status: DC | PRN
  Administered 2018-01-14 – 2018-01-15 (×2): 1 via ORAL
  Filled 2018-01-14 (×2): qty 1

## 2018-01-14 MED ORDER — INSULIN ASPART 100 UNIT/ML ~~LOC~~ SOLN
0.0000 [IU] | Freq: Three times a day (TID) | SUBCUTANEOUS | Status: DC
  Administered 2018-01-14: 1 [IU] via SUBCUTANEOUS
  Administered 2018-01-15 – 2018-01-16 (×2): 2 [IU] via SUBCUTANEOUS

## 2018-01-14 MED ORDER — POTASSIUM CHLORIDE CRYS ER 20 MEQ PO TBCR
40.0000 meq | EXTENDED_RELEASE_TABLET | Freq: Once | ORAL | Status: AC
  Administered 2018-01-14: 40 meq via ORAL
  Filled 2018-01-14: qty 2

## 2018-01-14 MED ORDER — ATORVASTATIN CALCIUM 20 MG PO TABS
20.0000 mg | ORAL_TABLET | Freq: Every day | ORAL | Status: DC
  Administered 2018-01-14 – 2018-01-16 (×3): 20 mg via ORAL
  Filled 2018-01-14 (×3): qty 1

## 2018-01-14 MED ORDER — CYANOCOBALAMIN 1000 MCG/ML IJ SOLN
1000.0000 ug | Freq: Once | INTRAMUSCULAR | Status: AC
  Administered 2018-01-14: 1000 ug via SUBCUTANEOUS
  Filled 2018-01-14: qty 1

## 2018-01-14 MED ORDER — ACETAMINOPHEN 325 MG PO TABS
650.0000 mg | ORAL_TABLET | Freq: Four times a day (QID) | ORAL | Status: DC | PRN
  Administered 2018-01-14 (×3): 650 mg via ORAL
  Filled 2018-01-14 (×3): qty 2

## 2018-01-14 MED ORDER — HYDRALAZINE HCL 20 MG/ML IJ SOLN: 10.0000 mg | INTRAMUSCULAR | Status: DC | PRN

## 2018-01-14 MED ORDER — LISINOPRIL 10 MG PO TABS
10.0000 mg | ORAL_TABLET | Freq: Every day | ORAL | Status: DC
  Administered 2018-01-14 – 2018-01-17 (×3): 10 mg via ORAL
  Filled 2018-01-14 (×3): qty 1

## 2018-01-14 MED ORDER — ONDANSETRON HCL 4 MG/2ML IJ SOLN: 4.0000 mg | Freq: Four times a day (QID) | INTRAMUSCULAR | Status: DC | PRN

## 2018-01-14 NOTE — Progress Notes (Signed)
Orthopaedic Trauma Progress Note  Patient returns today with few day history of dehiscence of his transmetatarsal amputation suture line.  He also has drainage and foul-smelling purulence with x-rays finding osteomyelitis of his first and fifth metatarsal.  His transmetatarsal amputation was already very proximal due to the erythema from his previous infection from his frostbite of his toes.  I have asked Dr. Lajoyce Cornersuda to assist with management and he will evaluate the patient and see if there is any other options more than a BKA.  Please keep patient n.p.o. until Dr. Lajoyce Cornersuda evaluates the patient.  He may continue with IV antibiotics.  Mark LoftsKevin P. Hillery Zachman, MD [facility] [phone] (phone)

## 2018-01-14 NOTE — H&P (Signed)
History and Physical    Mark Graham ZOX:[id number] DOB: 07/09/1956 DOA: 01/13/2018  PCP: Hoy RegisterNewlin, Enobong, MD  Patient coming from: Home.  Chief Complaint: Left foot swelling and discharge.  HPI: Mark CastleJerry D Graham is a 62 y.o. male with history of diabetes mellitus type 2, hypertension, hyperlipidemia, depression who was recently admitted last month for gangrenous left foot after frostbite had undergone transmetatarsal foot amputation was eventually discharged to skilled nursing facility and is presently at home.  Over the last 2 days he noticed an increasing discharge from his suture area that he had transmetatarsal amputation done.  Discharge is purulent and skin around the stump looks infected.  Patient denies any fever or chills.  ED Course: In the ER x-rays revealed possibility of developing osteomyelitis of the first and fifth metatarsal bone.  On exam patient also had swelling discharge and purulent area and skin look infected.  On-call orthopedic surgeon for Dr. Jena GaussHaddix was consulted and admitted for further management and possible requirement for surgery.    Review of Systems: As per HPI, rest all negative.   Past Medical History:  Diagnosis Date  . Arthritis    "probably in my hands" (11/24/2017)  . Cellulitis of left foot 11/24/2017  . High cholesterol   . Hypertension   . Osteomyelitis of left foot (HCC)    Osteomyelitis of left hallux with overlying cellulitis left foot/notes 11/24/2017  . Tobacco abuse   . Tuberculosis ~ 1990   "had tx for it"  . Type II diabetes mellitus (HCC)     Past Surgical History:  Procedure Laterality Date  . AMPUTATION Left 11/25/2017   Procedure: TRANSMETATARSAL AMPUTATION;  Surgeon: Roby LoftsHaddix, Kevin P, MD;  Location: [facility] OR;  Service: Orthopedics;  Laterality: Left;  . NO PAST SURGERIES       reports that he has been smoking cigarettes.  He has a 46.00 pack-year smoking history. He has quit using smokeless tobacco. His smokeless tobacco use included  chew. He reports that he drinks about 3.6 oz of alcohol per week. He reports that he has current or past drug history. Drug: Marijuana.  No Known Allergies  Family History  Problem Relation Age of Onset  . Cancer Father   . Cancer Sister   . Diabetes Mellitus II Unknown        States 6 out of 13 of his siblings     Prior to Admission medications   Medication Sig Start Date End Date Taking? Authorizing Provider  atorvastatin (LIPITOR) 20 MG tablet Take 1 tablet (20 mg total) by mouth daily at 6 PM. 11/29/17  Yes Scherrie GerlachHuang, Jennifer, MD  lisinopril (PRINIVIL,ZESTRIL) 10 MG tablet Take 1 tablet (10 mg total) by mouth daily. 11/30/17  Yes Scherrie GerlachHuang, Jennifer, MD  metFORMIN (GLUCOPHAGE) 500 MG tablet Take 1 tablet (500 mg total) by mouth 2 (two) times daily with a meal. 11/29/17  Yes Scherrie GerlachHuang, Jennifer, MD  QUEtiapine (SEROQUEL) 50 MG tablet Take 1 tablet (50 mg total) by mouth at bedtime. 11/29/17  Yes Scherrie GerlachHuang, Jennifer, MD  Blood Glucose Monitoring Suppl (TRUE METRIX METER) DEVI 1 each by Does not apply route daily. Patient not taking: Reported on 01/13/2018 11/29/17   Scherrie GerlachHuang, Jennifer, MD    Physical Exam: Vitals:   01/13/18 1613 01/13/18 1615 01/13/18 1933 01/14/18 0015  BP: (!) 163/107 (!) 163/107 (!) 149/85 129/79  Pulse: 91 100 96 70  Resp: 18 18 16    Temp: 98.3 F (36.8 C) 98.6 F (37 C)  SpO2: 99% 100% 100% 98%  Weight: 65.3 kg (144 lb)         Constitutional: Moderately built and nourished. Vitals:   01/13/18 1613 01/13/18 1615 01/13/18 1933 01/14/18 0015  BP: (!) 163/107 (!) 163/107 (!) 149/85 129/79  Pulse: 91 100 96 70  Resp: 18 18 16    Temp: 98.3 F (36.8 C) 98.6 F (37 C)    SpO2: 99% 100% 100% 98%  Weight: 65.3 kg (144 lb)      Eyes: Anicteric no pallor. ENMT: No discharge from the ears eyes nose or mouth. Neck: No mass felt.  No neck rigidity. Respiratory: No rhonchi or crepitations. Cardiovascular: S1-S2 heard no murmurs appreciated. Abdomen: Soft nontender bowel sounds  present. Musculoskeletal: Left foot is swollen with stump area looking infected with discharge. Skin: Left foot stump area looks infected with purulent discharge. Neurologic: Alert awake oriented to time place and person.  Moves all extremities. Psychiatric: Appears normal.  Normal affect.   Labs on Admission: I have personally reviewed following labs and imaging studies  CBC: Recent Labs  Lab 01/13/18 1623  WBC 7.9  NEUTROABS 4.9  HGB 10.8*  HCT 32.0*  MCV 95.5  PLT 269   Basic Metabolic Panel: Recent Labs  Lab 01/13/18 1623  NA 135  K 3.6  CL 103  CO2 24  GLUCOSE 178*  BUN 7  CREATININE 1.05  CALCIUM 8.8*   GFR: Estimated Creatinine Clearance: 68.2 mL/min (by C-G formula based on SCr of 1.05 mg/dL). Liver Function Tests: Recent Labs  Lab 01/13/18 1623  AST 20  ALT 13*  ALKPHOS 89  BILITOT 0.4  PROT 6.5  ALBUMIN 3.6   No results for input(s): LIPASE, AMYLASE in the last 168 hours. No results for input(s): AMMONIA in the last 168 hours. Coagulation Profile: No results for input(s): INR, PROTIME in the last 168 hours. Cardiac Enzymes: No results for input(s): CKTOTAL, CKMB, CKMBINDEX, TROPONINI in the last 168 hours. BNP (last 3 results) No results for input(s): PROBNP in the last 8760 hours. HbA1C: No results for input(s): HGBA1C in the last 72 hours. CBG: No results for input(s): GLUCAP in the last 168 hours. Lipid Profile: No results for input(s): CHOL, HDL, LDLCALC, TRIG, CHOLHDL, LDLDIRECT in the last 72 hours. Thyroid Function Tests: No results for input(s): TSH, T4TOTAL, FREET4, T3FREE, THYROIDAB in the last 72 hours. Anemia Panel: No results for input(s): VITAMINB12, FOLATE, FERRITIN, TIBC, IRON, RETICCTPCT in the last 72 hours. Urine analysis:    Component Value Date/Time   COLORURINE YELLOW 01/13/2018 0041   APPEARANCEUR HAZY (A) 01/13/2018 0041   LABSPEC 1.005 01/13/2018 0041   PHURINE 6.0 01/13/2018 0041   GLUCOSEU NEGATIVE 01/13/2018  0041   HGBUR SMALL (A) 01/13/2018 0041   BILIRUBINUR NEGATIVE 01/13/2018 0041   KETONESUR NEGATIVE 01/13/2018 0041   PROTEINUR NEGATIVE 01/13/2018 0041   UROBILINOGEN 1.0 01/15/2015 1946   NITRITE NEGATIVE 01/13/2018 0041   LEUKOCYTESUR LARGE (A) 01/13/2018 0041   Sepsis Labs: @LABRCNTIP (procalcitonin:4,lacticidven:4) )No results found for this or any previous visit (from the past 240 hour(s)).   Radiological Exams on Admission: Dg Foot Complete Left  Result Date: 01/13/2018 CLINICAL DATA:  Left foot wound dehiscence following amputation. Edema and erythema. EXAM: LEFT FOOT - COMPLETE 3+ VIEW COMPARISON:  11/25/2017. FINDINGS: Again demonstrated are post amputation changes at the level of the proximal metatarsals. Interval mild bone destruction involving the distal aspect of the remaining portion of the 1st metatarsal. There is also interval mild periosteal new bone  formation involving the distal aspect of the remaining portion of the 5th metatarsal. Diffuse soft tissue swelling with no soft tissue gas. IMPRESSION: Findings suspicious for the possibility of osteomyelitis involving the remaining portion of the 1st metatarsal and 5th metatarsal. Electronically Signed   By: Beckie Salts M.D.   On: 01/13/2018 17:35     Assessment/Plan Principal Problem:   Osteomyelitis of left foot (HCC) Active Problems:   Hypertension   S/P transmetatarsal amputation of foot, left (HCC)   Diabetes mellitus type 2 in nonobese (HCC)   Normochromic normocytic anemia   Foot osteomyelitis, left (HCC)    1. Osteomyelitis of the left foot with cellulitis -patient was started on vancomycin and Zosyn.  Follow cultures.  Dr. Jena Gauss will be seeing patient in consult. 2. Diabetes mellitus type 2 -we will keep patient on sliding scale while inpatient. 3. Hypertension on lisinopril.  Closely follow blood pressure trends.  PRN IV hydralazine for systolic blood pressure more than 160. 4. Normocytic normochromic anemia  -follow CBC.  Type and screen.  Check anemia panel. 5. Depression on Seroquel.   DVT prophylaxis: SCDs in anticipation of surgery. Code Status: Full code. Family Communication: Discussed with patient. Disposition Plan: Home. Consults called: Orthopedic surgery. Admission status: Inpatient.   Eduard Clos MD [facility] Pager [phone].  If 7PM-7AM, please contact night-coverage [email or web address] Password [email or web address]  01/14/2018, 1:40 AM

## 2018-01-14 NOTE — ED Notes (Signed)
Admitting at bedside 

## 2018-01-14 NOTE — Plan of Care (Signed)
  Problem: Education: Goal: Knowledge of General Education information will improve Outcome: Progressing   Problem: Clinical Measurements: Goal: Ability to maintain clinical measurements within normal limits will improve Outcome: Progressing   Problem: Pain Managment: Goal: General experience of comfort will improve Outcome: Progressing   

## 2018-01-14 NOTE — Telephone Encounter (Signed)
Call placed to patient's emergency contact Neale Burly(David Milby) #[phone], to check on patient's status. Call rang several times and then it dropped. Unable to reach patient.

## 2018-01-14 NOTE — Progress Notes (Signed)
Patient was NPO all day waiting for Dr. Lajoyce Cornersuda to evaluate foot per Dr. Jena GaussHaddix. Dr. Lajoyce Cornersuda had not been by yet so paged Haddix to let him know. He spoke to Dr. Lajoyce Cornersuda who won't be able to see patient until tomorrow so he said patient can eat now and to make NPO after midnight.

## 2018-01-14 NOTE — Telephone Encounter (Signed)
Call placed to patient #[phone] to check on his status especially since he was in the ED yesterday. No answer. Call went straight to voicemail.  Left patient a message asking him to return my call at [phone].

## 2018-01-14 NOTE — Plan of Care (Signed)
  Problem: Education: Goal: Knowledge of General Education information will improve Outcome: Progressing   Problem: Health Behavior/Discharge Planning: Goal: Ability to manage health-related needs will improve Outcome: Progressing   Problem: Clinical Measurements: Goal: Ability to maintain clinical measurements within normal limits will improve Outcome: Progressing Goal: Will remain free from infection Outcome: Progressing Goal: Diagnostic test results will improve Outcome: Progressing Goal: Respiratory complications will improve Outcome: Progressing Goal: Cardiovascular complication will be avoided Outcome: Progressing   Problem: Activity: Goal: Risk for activity intolerance will decrease Outcome: Progressing   Problem: Nutrition: Goal: Adequate nutrition will be maintained Outcome: Progressing   Problem: Pain Managment: Goal: General experience of comfort will improve Outcome: Progressing   Problem: Skin Integrity: Goal: Risk for impaired skin integrity will decrease Outcome: Progressing   

## 2018-01-14 NOTE — Progress Notes (Signed)
PROGRESS NOTE    Mark Graham   ZOX:[id number]  DOB: 1956-04-12  DOA: 01/13/2018 PCP: Hoy Register, MD   Brief Narrative:  Mark Graham is a 62 y.o. male with history of diabetes mellitus type 2, hypertension, hyperlipidemia, depression who was recently admitted last month for gangrenous left foot after frostbite and underwent a transmetatarsal amputation on 11/25/17 was eventually discharged to skilled nursing facility and then discharged home.  Over the last 2 days he noticed an increasing foul smelling discharge from the wound.   Xray is concerning for osteomylelits of 1st and 5th metatarsals.   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  - With low normal B12 at 226- will replace B12 with s/c B12    DVT prophylaxis: per ortho after surgery Code Status: Full code Family Communication:  Disposition Plan: f/u post op Consultants:   ortho Procedures:    Antimicrobials:  Anti-infectives (From admission, onward)   Start     Dose/Rate Route Frequency Ordered Stop   01/14/18 1200  vancomycin (VANCOCIN) IVPB 750 mg/150 ml premix     750 mg 150 mL/hr over 60 Minutes Intravenous Every 12 hours 01/13/18 2354     01/14/18 0600  piperacillin-tazobactam (ZOSYN) IVPB 3.375 g     3.375 g 12.5 mL/hr over 240 Minutes Intravenous Every 8 hours 01/13/18 2354     01/14/18 0000   vancomycin (VANCOCIN) 1,500 mg in sodium chloride 0.9 % 500 mL IVPB     1,500 mg 250 mL/hr over 120 Minutes Intravenous  Once 01/13/18 2354 01/14/18 0236   01/14/18 0000  piperacillin-tazobactam (ZOSYN) IVPB 3.375 g     3.375 g 100 mL/hr over 30 Minutes Intravenous  Once 01/13/18 2354 01/14/18 0118       Objective: Vitals:   01/14/18 0130 01/14/18 0145 01/14/18 0200 01/14/18 0256  BP: 122/76 133/78 (!) 130/91 136/78  Pulse: 63 63 68 88  Resp:      Temp:    97.7 F (36.5 C)  TempSrc:    Oral  SpO2: 99% 98% 94% 98%  Weight:        Intake/Output Summary (Last 24 hours) at 01/14/2018 1035 Last data filed at 01/14/2018 0528 Gross per 24 hour  Intake 372.5 ml  Output 625 ml  Net -252.5 ml   Filed Weights   01/13/18 1613  Weight: 65.3 kg (144 lb)    Examination: General exam: Appears comfortable - sleeping Respiratory system: Clear to auscultation listened to anteriorly.   Cardiovascular system: RRR Gastrointestinal system: Abdomen soft,  Normal bowel sound.   Central nervous system: will not awaken to allow me to assess Extremities:  foul smelling wound on left foot   Data Reviewed: I have personally reviewed following labs and imaging studies  CBC: Recent Labs  Lab 01/13/18 1623 01/14/18 0440  WBC  7.9 5.4  NEUTROABS 4.9  --   HGB 10.8* 9.8*  HCT 32.0* 29.9*  MCV 95.5 95.8  PLT 269 240   Basic Metabolic Panel: Recent Labs  Lab 01/13/18 1623 01/14/18 0440  NA 135 137  K 3.6 3.4*  CL 103 103  CO2 24 25  GLUCOSE 178* 144*  BUN 7 7  CREATININE 1.05 1.03  CALCIUM 8.8* 8.4*   GFR: Estimated Creatinine Clearance: 69.6 mL/min (by C-G formula based on SCr of 1.03 mg/dL). Liver Function Tests: Recent Labs  Lab 01/13/18 1623  AST 20  ALT 13*  ALKPHOS 89  BILITOT 0.4  PROT 6.5  ALBUMIN 3.6   No results for input(s): LIPASE, AMYLASE in the last 168 hours. No results for input(s): AMMONIA in the last 168 hours. Coagulation Profile: No results for  input(s): INR, PROTIME in the last 168 hours. Cardiac Enzymes: No results for input(s): CKTOTAL, CKMB, CKMBINDEX, TROPONINI in the last 168 hours. BNP (last 3 results) No results for input(s): PROBNP in the last 8760 hours. HbA1C: No results for input(s): HGBA1C in the last 72 hours. CBG: Recent Labs  Lab 01/14/18 0636  GLUCAP 126*   Lipid Profile: No results for input(s): CHOL, HDL, LDLCALC, TRIG, CHOLHDL, LDLDIRECT in the last 72 hours. Thyroid Function Tests: No results for input(s): TSH, T4TOTAL, FREET4, T3FREE, THYROIDAB in the last 72 hours. Anemia Panel: Recent Labs    01/14/18 0440  VITAMINB12 226  FOLATE 14.2  FERRITIN 107  TIBC 203*  IRON 40*  RETICCTPCT 1.6   Urine analysis:    Component Value Date/Time   COLORURINE YELLOW 01/13/2018 0041   APPEARANCEUR HAZY (A) 01/13/2018 0041   LABSPEC 1.005 01/13/2018 0041   PHURINE 6.0 01/13/2018 0041   GLUCOSEU NEGATIVE 01/13/2018 0041   HGBUR SMALL (A) 01/13/2018 0041   BILIRUBINUR NEGATIVE 01/13/2018 0041   KETONESUR NEGATIVE 01/13/2018 0041   PROTEINUR NEGATIVE 01/13/2018 0041   UROBILINOGEN 1.0 01/15/2015 1946   NITRITE NEGATIVE 01/13/2018 0041   LEUKOCYTESUR LARGE (A) 01/13/2018 0041   Sepsis Labs: @LABRCNTIP (procalcitonin:4,lacticidven:4) ) Recent Results (from the past 240 hour(s))  Wound or Superficial Culture     Status: None (Preliminary result)   Collection Time: 01/14/18 12:10 AM  Result Value Ref Range Status   Specimen Description WOUND LEFT FOOT  Final   Special Requests Immunocompromised  Final   Gram Stain   Final    FEW WBC PRESENT,BOTH PMN AND MONONUCLEAR ABUNDANT GRAM POSITIVE COCCI ABUNDANT GRAM NEGATIVE RODS Performed at [facility], [address] [place], [place] [id number]    Culture PENDING  Incomplete   Report Status PENDING  Incomplete         Radiology Studies: Dg Foot Complete Left  Result Date: 01/13/2018 CLINICAL DATA:  Left foot wound dehiscence following  amputation. Edema and erythema. EXAM: LEFT FOOT - COMPLETE 3+ VIEW COMPARISON:  11/25/2017. FINDINGS: Again demonstrated are post amputation changes at the level of the proximal metatarsals. Interval mild bone destruction involving the distal aspect of the remaining portion of the 1st metatarsal. There is also interval mild periosteal new bone formation involving the distal aspect of the remaining portion of the 5th metatarsal. Diffuse soft tissue swelling with no soft tissue gas. IMPRESSION: Findings suspicious for the possibility of osteomyelitis involving the remaining portion of the 1st metatarsal and 5th metatarsal. Electronically Signed   By: Beckie SaltsSteven  Reid M.D.   On: 01/13/2018 17:35      Scheduled Meds: . atorvastatin  20 mg  Oral q1800  . insulin aspart  0-9 Units Subcutaneous TID WC  . lisinopril  10 mg Oral Daily  . QUEtiapine  50 mg Oral QHS   Continuous Infusions: . piperacillin-tazobactam (ZOSYN)  IV Stopped (01/14/18 0928)  . vancomycin       LOS: 0 days    Time spent in minutes: 35    Calvert Cantor, MD [facility] Pager: [email or web address] Password [facility] 01/14/2018, 10:35 AM

## 2018-01-15 ENCOUNTER — Encounter (HOSPITAL_COMMUNITY)
Admission: EM | Disposition: A | Discharge status: Home / Self Care | Payer: Self-pay | Source: Home / Self Care | Attending: Internal Medicine

## 2018-01-15 ENCOUNTER — Encounter (HOSPITAL_COMMUNITY): Payer: Self-pay | Admitting: *Deleted

## 2018-01-15 ENCOUNTER — Inpatient Hospital Stay (HOSPITAL_COMMUNITY): Payer: Self-pay | Admitting: Certified Registered"

## 2018-01-15 DIAGNOSIS — M86272 Subacute osteomyelitis, left ankle and foot: Secondary | ICD-10-CM

## 2018-01-15 DIAGNOSIS — Z89432 Acquired absence of left foot: Secondary | ICD-10-CM

## 2018-01-15 DIAGNOSIS — E119 Type 2 diabetes mellitus without complications: Secondary | ICD-10-CM

## 2018-01-15 DIAGNOSIS — L089 Local infection of the skin and subcutaneous tissue, unspecified: Secondary | ICD-10-CM

## 2018-01-15 HISTORY — PX: AMPUTATION: SHX166

## 2018-01-15 LAB — SURGICAL PCR SCREEN
MRSA, PCR: NEGATIVE
STAPHYLOCOCCUS AUREUS: NEGATIVE

## 2018-01-15 LAB — GLUCOSE, CAPILLARY
GLUCOSE-CAPILLARY: 85 mg/dL (ref 65–99)
GLUCOSE-CAPILLARY: 151 mg/dL — AB (ref 65–99)
Glucose-Capillary: 102 mg/dL — ABNORMAL HIGH (ref 65–99)
Glucose-Capillary: 95 mg/dL (ref 65–99)
Glucose-Capillary: 163 mg/dL — ABNORMAL HIGH (ref 65–99)

## 2018-01-15 SURGERY — AMPUTATION, FOOT, RAY
Anesthesia: General | Site: Foot | Laterality: Left

## 2018-01-15 MED ORDER — POVIDONE-IODINE 10 % EX SWAB: 2.0000 "application " | Freq: Once | CUTANEOUS | Status: DC

## 2018-01-15 MED ORDER — 0.9 % SODIUM CHLORIDE (POUR BTL) OPTIME
TOPICAL | Status: DC | PRN
  Administered 2018-01-15: 1000 mL

## 2018-01-15 MED ORDER — ONDANSETRON HCL 4 MG/2ML IJ SOLN
INTRAMUSCULAR | Status: AC
  Filled 2018-01-15: qty 2

## 2018-01-15 MED ORDER — HYDROCODONE-ACETAMINOPHEN 7.5-325 MG PO TABS
1.0000 | ORAL_TABLET | ORAL | Status: DC | PRN
  Administered 2018-01-15 – 2018-01-16 (×4): 2 via ORAL
  Filled 2018-01-15 (×4): qty 2

## 2018-01-15 MED ORDER — VITAMIN B-12 1000 MCG PO TABS
1000.0000 ug | ORAL_TABLET | Freq: Every day | ORAL | Status: DC
  Administered 2018-01-15 – 2018-01-16 (×2): 1000 ug via ORAL
  Filled 2018-01-15 (×2): qty 1

## 2018-01-15 MED ORDER — HYDROMORPHONE HCL 1 MG/ML IJ SOLN
0.2500 mg | INTRAMUSCULAR | Status: DC | PRN
  Administered 2018-01-15 (×3): 0.5 mg via INTRAVENOUS

## 2018-01-15 MED ORDER — MIDAZOLAM HCL 2 MG/2ML IJ SOLN
INTRAMUSCULAR | Status: DC | PRN
  Administered 2018-01-15: 2 mg via INTRAVENOUS

## 2018-01-15 MED ORDER — METOCLOPRAMIDE HCL 5 MG PO TABS: 5.0000 mg | ORAL_TABLET | Freq: Three times a day (TID) | ORAL | Status: DC | PRN

## 2018-01-15 MED ORDER — FENTANYL CITRATE (PF) 250 MCG/5ML IJ SOLN
INTRAMUSCULAR | Status: AC
  Filled 2018-01-15: qty 5

## 2018-01-15 MED ORDER — ONDANSETRON HCL 4 MG/2ML IJ SOLN: 4.0000 mg | Freq: Once | INTRAMUSCULAR | Status: DC | PRN

## 2018-01-15 MED ORDER — METHOCARBAMOL 500 MG PO TABS
ORAL_TABLET | ORAL | Status: AC
  Filled 2018-01-15: qty 1

## 2018-01-15 MED ORDER — ONDANSETRON HCL 4 MG PO TABS: 4.0000 mg | ORAL_TABLET | Freq: Four times a day (QID) | ORAL | Status: DC | PRN

## 2018-01-15 MED ORDER — LIDOCAINE 2% (20 MG/ML) 5 ML SYRINGE
INTRAMUSCULAR | Status: DC | PRN
  Administered 2018-01-15: 100 mg via INTRAVENOUS

## 2018-01-15 MED ORDER — HYDROCODONE-ACETAMINOPHEN 5-325 MG PO TABS
1.0000 | ORAL_TABLET | ORAL | Status: DC | PRN
  Administered 2018-01-15 – 2018-01-16 (×2): 2 via ORAL
  Filled 2018-01-15 (×2): qty 2

## 2018-01-15 MED ORDER — METHOCARBAMOL 1000 MG/10ML IJ SOLN
500.0000 mg | Freq: Four times a day (QID) | INTRAVENOUS | Status: DC | PRN
  Filled 2018-01-15: qty 5

## 2018-01-15 MED ORDER — PROPOFOL 10 MG/ML IV BOLUS
INTRAVENOUS | Status: DC | PRN
  Administered 2018-01-15: 150 mg via INTRAVENOUS

## 2018-01-15 MED ORDER — DOCUSATE SODIUM 100 MG PO CAPS
100.0000 mg | ORAL_CAPSULE | Freq: Two times a day (BID) | ORAL | Status: DC
  Administered 2018-01-15 – 2018-01-17 (×4): 100 mg via ORAL
  Filled 2018-01-15 (×4): qty 1

## 2018-01-15 MED ORDER — ONDANSETRON HCL 4 MG/2ML IJ SOLN
INTRAMUSCULAR | Status: DC | PRN
  Administered 2018-01-15: 4 mg via INTRAVENOUS

## 2018-01-15 MED ORDER — LACTATED RINGERS IV SOLN
INTRAVENOUS | Status: DC | PRN
  Administered 2018-01-15: 10:00:00 via INTRAVENOUS

## 2018-01-15 MED ORDER — HYDROMORPHONE HCL 1 MG/ML IJ SOLN
INTRAMUSCULAR | Status: AC
  Administered 2018-01-15: 0.5 mg via INTRAVENOUS
  Filled 2018-01-15: qty 1

## 2018-01-15 MED ORDER — FENTANYL CITRATE (PF) 100 MCG/2ML IJ SOLN
INTRAMUSCULAR | Status: DC | PRN
  Administered 2018-01-15: 50 ug via INTRAVENOUS

## 2018-01-15 MED ORDER — METHOCARBAMOL 500 MG PO TABS
500.0000 mg | ORAL_TABLET | Freq: Four times a day (QID) | ORAL | Status: DC | PRN
  Administered 2018-01-15 – 2018-01-17 (×3): 500 mg via ORAL
  Filled 2018-01-15 (×5): qty 1

## 2018-01-15 MED ORDER — MEPERIDINE HCL 50 MG/ML IJ SOLN: 6.2500 mg | INTRAMUSCULAR | Status: DC | PRN

## 2018-01-15 MED ORDER — ONDANSETRON HCL 4 MG/2ML IJ SOLN: 4.0000 mg | Freq: Four times a day (QID) | INTRAMUSCULAR | Status: DC | PRN

## 2018-01-15 MED ORDER — MORPHINE SULFATE (PF) 2 MG/ML IV SOLN: 0.5000 mg | INTRAVENOUS | Status: DC | PRN

## 2018-01-15 MED ORDER — CEFAZOLIN SODIUM-DEXTROSE 2-4 GM/100ML-% IV SOLN: 2.0000 g | INTRAVENOUS | Status: DC

## 2018-01-15 MED ORDER — BISACODYL 10 MG RE SUPP: 10.0000 mg | Freq: Every day | RECTAL | Status: DC | PRN

## 2018-01-15 MED ORDER — POLYETHYLENE GLYCOL 3350 17 G PO PACK
17.0000 g | PACK | Freq: Every day | ORAL | Status: DC | PRN
  Administered 2018-01-17: 17 g via ORAL
  Filled 2018-01-15: qty 1

## 2018-01-15 MED ORDER — MAGNESIUM CITRATE PO SOLN: 1.0000 | Freq: Once | ORAL | Status: DC | PRN

## 2018-01-15 MED ORDER — CHLORHEXIDINE GLUCONATE 4 % EX LIQD
60.0000 mL | Freq: Once | CUTANEOUS | Status: AC
  Administered 2018-01-15: 4 via TOPICAL

## 2018-01-15 MED ORDER — LIDOCAINE HCL (CARDIAC) 20 MG/ML IV SOLN
INTRAVENOUS | Status: AC
  Filled 2018-01-15: qty 5

## 2018-01-15 MED ORDER — PROPOFOL 10 MG/ML IV BOLUS
INTRAVENOUS | Status: AC
  Filled 2018-01-15: qty 20

## 2018-01-15 MED ORDER — MIDAZOLAM HCL 2 MG/2ML IJ SOLN
INTRAMUSCULAR | Status: AC
  Filled 2018-01-15: qty 2

## 2018-01-15 MED ORDER — ACETAMINOPHEN 325 MG PO TABS: 325.0000 mg | ORAL_TABLET | Freq: Four times a day (QID) | ORAL | Status: DC | PRN

## 2018-01-15 MED ORDER — METOCLOPRAMIDE HCL 5 MG/ML IJ SOLN: 5.0000 mg | Freq: Three times a day (TID) | INTRAMUSCULAR | Status: DC | PRN

## 2018-01-15 MED ORDER — SODIUM CHLORIDE 0.9 % IV SOLN: INTRAVENOUS | Status: DC

## 2018-01-15 MED ORDER — HYDROMORPHONE HCL 1 MG/ML IJ SOLN
INTRAMUSCULAR | Status: AC
  Filled 2018-01-15: qty 1

## 2018-01-15 SURGICAL SUPPLY — 30 items
BLADE SAW SGTL MED 73X18.5 STR (BLADE) ×3 IMPLANT
BLADE SURG 21 STRL SS (BLADE) ×3 IMPLANT
BNDG COHESIVE 4X5 TAN STRL (GAUZE/BANDAGES/DRESSINGS) ×3 IMPLANT
BNDG GAUZE ELAST 4 BULKY (GAUZE/BANDAGES/DRESSINGS) ×3 IMPLANT
COVER SURGICAL LIGHT HANDLE (MISCELLANEOUS) ×6 IMPLANT
DRAPE U-SHAPE 47X51 STRL (DRAPES) ×6 IMPLANT
DRESSING PREVENA PLUS CUSTOM (GAUZE/BANDAGES/DRESSINGS) ×1 IMPLANT
DRSG ADAPTIC 3X8 NADH LF (GAUZE/BANDAGES/DRESSINGS) ×3 IMPLANT
DRSG PAD ABDOMINAL 8X10 ST (GAUZE/BANDAGES/DRESSINGS) ×6 IMPLANT
DRSG PREVENA PLUS CUSTOM (GAUZE/BANDAGES/DRESSINGS) ×3
DURAPREP 26ML APPLICATOR (WOUND CARE) ×3 IMPLANT
ELECT REM PT RETURN 9FT ADLT (ELECTROSURGICAL) ×3
ELECTRODE REM PT RTRN 9FT ADLT (ELECTROSURGICAL) ×1 IMPLANT
GAUZE SPONGE 4X4 12PLY STRL (GAUZE/BANDAGES/DRESSINGS) ×3 IMPLANT
GLOVE BIOGEL PI IND STRL 9 (GLOVE) ×1 IMPLANT
GLOVE BIOGEL PI INDICATOR 9 (GLOVE) ×2
GLOVE SURG ORTHO 9.0 STRL STRW (GLOVE) ×3 IMPLANT
GOWN STRL REUS W/ TWL XL LVL3 (GOWN DISPOSABLE) ×2 IMPLANT
GOWN STRL REUS W/TWL XL LVL3 (GOWN DISPOSABLE) ×4
KIT BASIN OR (CUSTOM PROCEDURE TRAY) ×3 IMPLANT
KIT ROOM TURNOVER OR (KITS) ×3 IMPLANT
NS IRRIG 1000ML POUR BTL (IV SOLUTION) ×3 IMPLANT
PACK ORTHO EXTREMITY (CUSTOM PROCEDURE TRAY) ×3 IMPLANT
PAD ARMBOARD 7.5X6 YLW CONV (MISCELLANEOUS) ×6 IMPLANT
STOCKINETTE IMPERVIOUS LG (DRAPES) IMPLANT
SUT ETHILON 2 0 PSLX (SUTURE) ×3 IMPLANT
TOWEL OR 17X26 10 PK STRL BLUE (TOWEL DISPOSABLE) ×3 IMPLANT
TUBE CONNECTING 12'X1/4 (SUCTIONS) ×1
TUBE CONNECTING 12X1/4 (SUCTIONS) ×2 IMPLANT
YANKAUER SUCT BULB TIP NO VENT (SUCTIONS) ×3 IMPLANT

## 2018-01-15 NOTE — Progress Notes (Addendum)
PROGRESS NOTE    Mark Graham   RUE:[id number]  DOB: 07/21/1956  DOA: 01/13/2018 PCP: Hoy RegisterNewlin, Enobong, MD   Brief Narrative:  Mark Graham is a 62 y.o. male with history of diabetes mellitus type 2, hypertension, hyperlipidemia, depression who was recently admitted last month for gangrenous left foot after frostbite and underwent a transmetatarsal amputation on 11/25/17 was eventually discharged to skilled nursing facility and then discharged home.  Over the last 2 days he noticed an increasing foul smelling discharge from the wound.   Xray is concerning for osteomylelits of 1st and 5th metatarsals.   Subjective:  c/o pain in his left foot. No other complaints.    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 Dressing care/ Wound VAC: Discharge with the portable Praveena wound VAC pump. Ambulatory devices: Walker or crutches Discharge to: Home.   Active Problems: ? 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 01/15/18 0756 01/15/18 0943   01/14/18 1200  vancomycin (VANCOCIN) IVPB 750 mg/150 ml premix     750 mg 150 mL/hr over 60 Minutes Intravenous Every 12 hours 01/13/18 2354     01/14/18 0600  piperacillin-tazobactam (ZOSYN) IVPB 3.375 g     3.375 g 12.5 mL/hr over 240 Minutes Intravenous Every 8 hours 01/13/18 2354     01/14/18 0000  vancomycin (VANCOCIN) 1,500 mg in sodium chloride 0.9 % 500 mL IVPB     1,500 mg 250 mL/hr over 120 Minutes Intravenous  Once 01/13/18 2354 01/14/18 0236   01/14/18 0000  piperacillin-tazobactam (ZOSYN) IVPB 3.375 g     3.375 g 100 mL/hr over 30 Minutes Intravenous  Once 01/13/18 2354 01/14/18 0118       Objective: Vitals:   01/15/18 1223 01/15/18 1253 01/15/18 1330 01/15/18 1353  BP: 139/83 (!) 153/83  135/84  Pulse: 71 75 73 79  Resp: (!) 8 13 10 14   Temp:   97.7 F (36.5 C) (!) 97.5 F (36.4 C)  TempSrc:    Oral  SpO2: 96% 97% 97% 96%  Weight:        Intake/Output Summary (Last 24 hours) at 01/15/2018 1454 Last data filed at 01/15/2018  1300 Gross per 24 hour  Intake 400 ml  Output 350 ml  Net 50 ml   Filed Weights   01/13/18 1613  Weight: 65.3 kg (144 lb)    Examination: General exam: Appears comfortable - sleeping Respiratory system: Clear to auscultation listened to anteriorly.   Cardiovascular system: RRR Gastrointestinal system: Abdomen soft,  Normal bowel sound.   Central nervous system: will not awaken to allow me to assess Extremities:  foul smelling wound on left foot   Data Reviewed: I have personally reviewed following labs and imaging studies  CBC: Recent Labs  Lab 01/13/18 1623 01/14/18 0440  WBC 7.9 5.4  NEUTROABS 4.9  --   HGB 10.8*  9.8*  HCT 32.0* 29.9*  MCV 95.5 95.8  PLT 269 240   Basic Metabolic Panel: Recent Labs  Lab 01/13/18 1623 01/14/18 0440  NA 135 137  K 3.6 3.4*  CL 103 103  CO2 24 25  GLUCOSE 178* 144*  BUN 7 7  CREATININE 1.05 1.03  CALCIUM 8.8* 8.4*   GFR: Estimated Creatinine Clearance: 69.6 mL/min (by C-G formula based on SCr of 1.03 mg/dL). Liver Function Tests: Recent Labs  Lab 01/13/18 1623  AST 20  ALT 13*  ALKPHOS 89  BILITOT 0.4  PROT 6.5  ALBUMIN 3.6   No results for input(s): LIPASE, AMYLASE in the last 168 hours. No results for input(s): AMMONIA in the last 168 hours. Coagulation Profile: No results for input(s): INR, PROTIME in the last 168 hours. Cardiac Enzymes: No results for input(s): CKTOTAL, CKMB, CKMBINDEX, TROPONINI in the last 168 hours. BNP (last 3 results) No results for input(s): PROBNP in the last 8760 hours. HbA1C: No results for input(s): HGBA1C in the last 72 hours. CBG: Recent Labs  Lab 01/14/18 1715 01/14/18 2149 01/15/18 0613 01/15/18 0841 01/15/18 1117  GLUCAP 88 184* 102* 95 85   Lipid Profile: No results for input(s): CHOL, HDL, LDLCALC, TRIG, CHOLHDL, LDLDIRECT in the last 72 hours. Thyroid Function Tests: No results for input(s): TSH, T4TOTAL, FREET4, T3FREE, THYROIDAB in the last 72 hours. Anemia Panel: Recent Labs    01/14/18 0440  VITAMINB12 226  FOLATE 14.2  FERRITIN 107  TIBC 203*  IRON 40*  RETICCTPCT 1.6   Urine analysis:    Component Value Date/Time   COLORURINE YELLOW 01/13/2018 0041   APPEARANCEUR HAZY (A) 01/13/2018 0041   LABSPEC 1.005 01/13/2018 0041   PHURINE 6.0 01/13/2018 0041   GLUCOSEU NEGATIVE 01/13/2018 0041   HGBUR SMALL (A) 01/13/2018 0041   BILIRUBINUR NEGATIVE 01/13/2018 0041   KETONESUR NEGATIVE 01/13/2018 0041   PROTEINUR NEGATIVE 01/13/2018 0041   UROBILINOGEN 1.0 01/15/2015 1946   NITRITE NEGATIVE 01/13/2018 0041   LEUKOCYTESUR LARGE (A) 01/13/2018 0041   Sepsis  Labs: @LABRCNTIP (procalcitonin:4,lacticidven:4) ) Recent Results (from the past 240 hour(s))  Blood culture (routine x 2)     Status: None (Preliminary result)   Collection Time: 01/14/18 12:09 AM  Result Value Ref Range Status   Specimen Description BLOOD RIGHT FOREARM  Final   Special Requests   Final    BOTTLES DRAWN AEROBIC AND ANAEROBIC Blood Culture adequate volume   Culture   Final    NO GROWTH 1 DAY Performed at [facility], [address] [place], [place] [address]    Report Status PENDING  Incomplete  Wound or Superficial Culture     Status: None (Preliminary result)   Collection Time: 01/14/18 12:10 AM  Result Value Ref Range Status   Specimen  Description WOUND LEFT FOOT  Final   Special Requests Immunocompromised  Final   Gram Stain   Final    FEW WBC PRESENT,BOTH PMN AND MONONUCLEAR ABUNDANT GRAM POSITIVE COCCI ABUNDANT GRAM NEGATIVE RODS    Culture   Final    CULTURE REINCUBATED FOR BETTER GROWTH Performed at [facility], [address], [place], [place] [address]    Report Status PENDING  Incomplete  Blood culture (routine x 2)     Status: None (Preliminary result)   Collection Time: 01/14/18 12:19 AM  Result Value Ref Range Status   Specimen Description BLOOD LEFT ANTECUBITAL  Final   Special Requests   Final    IN PEDIATRIC BOTTLE Blood Culture results may not be optimal due to an excessive volume of blood received in culture bottles   Culture   Final    NO GROWTH 1 DAY Performed at [facility], [address] [place], [place] [address]    Report Status PENDING  Incomplete  Surgical pcr screen     Status: None   Collection Time: 01/15/18  8:25 AM  Result Value Ref Range Status   MRSA, PCR NEGATIVE NEGATIVE Final   Staphylococcus aureus NEGATIVE NEGATIVE Final    Comment: (NOTE) The Xpert SA Assay (FDA approved for NASAL specimens in patients 60 years of age and older), is one component of a comprehensive surveillance program. It  is not intended to diagnose infection nor to guide or monitor treatment. Performed at [facility], [address], [place], [place] [address]          Radiology Studies: Dg Foot Complete Left  Result Date: 01/13/2018 CLINICAL DATA:  Left foot wound dehiscence following amputation. Edema and erythema. EXAM: LEFT FOOT - COMPLETE 3+ VIEW COMPARISON:  11/25/2017. FINDINGS: Again demonstrated are post amputation changes at the level of the proximal metatarsals. Interval mild bone destruction involving the distal aspect of the remaining portion of the 1st metatarsal. There is also interval mild periosteal new bone formation involving the distal aspect of the remaining portion of the 5th metatarsal. Diffuse soft tissue swelling with no soft tissue gas. IMPRESSION: Findings suspicious for the possibility of osteomyelitis involving the remaining portion of the 1st metatarsal and 5th metatarsal. Electronically Signed   By: Beckie Salts M.D.   On: 01/13/2018 17:35      Scheduled Meds: . atorvastatin  20 mg Oral q1800  . docusate sodium  100 mg Oral BID  . HYDROmorphone      . insulin aspart  0-9 Units Subcutaneous TID WC  . lisinopril  10 mg Oral Daily  . methocarbamol      . QUEtiapine  50 mg Oral QHS   Continuous Infusions: . sodium chloride    . methocarbamol (ROBAXIN)  IV    . piperacillin-tazobactam (ZOSYN)  IV 3.375 g (01/15/18 0624)  . vancomycin 750 mg (01/15/18 1450)     LOS: 1 day    Time spent in minutes: 35    Calvert Cantor, MD [facility] Pager: [email or web address] Password [facility] 01/15/2018, 2:54 PM

## 2018-01-15 NOTE — Op Note (Signed)
01/13/2018 - 01/15/2018  10:41 AM  PATIENT:  Mark Graham    PRE-OPERATIVE DIAGNOSIS:  osteomyelitis left foot  POST-OPERATIVE DIAGNOSIS:  Same  PROCEDURE:  REVISION TRANS METATARSAL AMPUTATION  SURGEON:  Nadara MustardMarcus V Duda, MD  PHYSICIAN ASSISTANT:None ANESTHESIA:   General  PREOPERATIVE INDICATIONS:  Mark Graham is a  62 y.o. male with a diagnosis of osteomyelitis left foot who failed conservative measures and elected for surgical management.    The risks benefits and alternatives were discussed with the patient preoperatively including but not limited to the risks of infection, bleeding, nerve injury, cardiopulmonary complications, the need for revision surgery, among others, and the patient was willing to proceed.  OPERATIVE IMPLANTS: Praveena wound VAC.  @ENCIMAGES @  OPERATIVE FINDINGS: Good petechial bleeding and no signs of abscess or bone infection at the resection site.  OPERATIVE PROCEDURE: Patient was brought the operating room and underwent a general anesthetic.  After adequate levels of anesthesia were obtained patient's left lower extremity was prepped using DuraPrep draped into a sterile field a timeout was called.  A fishmouth incision was made around the ulcerative tissue.  A revision of the transmetatarsal amputation was performed approximately centimeter proximal to the affected bone.  The bone had good bleeding there is no signs of abscess or infection the soft tissue was healthy viable electrocautery was used for hemostasis the wound was irrigated with normal saline.  The incision was closed using 2-0 nylon a Praveena wound VAC was applied this had a good suction fit patient was extubated taken the PACU in stable condition.   DISCHARGE PLANNING:  Antibiotic duration: Continue antibiotics for 24 hours postoperatively  Weightbearing: Strict nonweightbearing on the left  Pain medication: Continue current pain medication.  Dressing care/ Wound VAC: Discharge with the  portable Praveena wound VAC pump.  Ambulatory devices: Walker or crutches  Discharge to: Home.  Importance of smoking cessation was reinforced.  Follow-up: In the office 1 week post operative.

## 2018-01-15 NOTE — Transfer of Care (Signed)
Immediate Anesthesia Transfer of Care Note  Patient: Mark CastleJerry D Graham  Procedure(s) Performed: REVISION TRANS METATARSAL AMPUTATION (Left Foot)  Patient Location: PACU  Anesthesia Type:General  Level of Consciousness: awake, alert  and oriented  Airway & Oxygen Therapy: Patient Spontanous Breathing  Post-op Assessment: Report given to RN  Post vital signs: Reviewed and stable  Last Vitals:  Vitals Value Taken Time  BP    Temp    Pulse    Resp    SpO2      Last Pain:  Vitals:   01/15/18 0836  TempSrc: Oral  PainSc:       Patients Stated Pain Goal: 0 (01/14/18 0249)  Complications: No apparent anesthesia complications

## 2018-01-15 NOTE — Anesthesia Preprocedure Evaluation (Addendum)
Anesthesia Evaluation  Patient identified by MRN, date of birth, ID band Patient awake    Reviewed: Allergy & Precautions, NPO status , Patient's Chart, lab work & pertinent test results  Airway Mallampati: I  TM Distance: >3 FB Neck ROM: Full    Dental   Pulmonary Current Smoker,    Pulmonary exam normal        Cardiovascular hypertension, Pt. on medications Normal cardiovascular exam     Neuro/Psych Depression Schizophrenia    GI/Hepatic   Endo/Other  diabetes, Type 2, Oral Hypoglycemic Agents  Renal/GU      Musculoskeletal   Abdominal   Peds  Hematology   Anesthesia Other Findings   Reproductive/Obstetrics                             Anesthesia Physical Anesthesia Plan  ASA: III  Anesthesia Plan: General   Post-op Pain Management:    Induction: Intravenous  PONV Risk Score and Plan: 0 and Treatment may vary due to age or medical condition  Airway Management Planned: LMA  Additional Equipment:   Intra-op Plan:   Post-operative Plan: Extubation in OR  Informed Consent: I have reviewed the patients History and Physical, chart, labs and discussed the procedure including the risks, benefits and alternatives for the proposed anesthesia with the patient or authorized representative who has indicated his/her understanding and acceptance.     Plan Discussed with: CRNA and Surgeon  Anesthesia Plan Comments:        Anesthesia Quick Evaluation

## 2018-01-15 NOTE — Anesthesia Procedure Notes (Signed)
Procedure Name: LMA Insertion Date/Time: 01/15/2018 10:14 AM Performed by: De Nurseennie, Penina Reisner E, CRNA Pre-anesthesia Checklist: Patient identified, Emergency Drugs available, Suction available and Patient being monitored Patient Re-evaluated:Patient Re-evaluated prior to induction Oxygen Delivery Method: Circle System Utilized Preoxygenation: Pre-oxygenation with 100% oxygen Induction Type: IV induction Ventilation: Mask ventilation without difficulty LMA: LMA inserted LMA Size: 5.0 Number of attempts: 1 Placement Confirmation: positive ETCO2 Tube secured with: Tape Dental Injury: Teeth and Oropharynx as per pre-operative assessment

## 2018-01-15 NOTE — Anesthesia Postprocedure Evaluation (Signed)
Anesthesia Post Note  Patient: Mark CastleJerry D Graham  Procedure(s) Performed: REVISION TRANS METATARSAL AMPUTATION (Left Foot)     Patient location during evaluation: PACU Anesthesia Type: General Level of consciousness: awake and alert Pain management: pain level controlled Vital Signs Assessment: post-procedure vital signs reviewed and stable Respiratory status: spontaneous breathing, nonlabored ventilation, respiratory function stable and patient connected to nasal cannula oxygen Cardiovascular status: blood pressure returned to baseline and stable Postop Assessment: no apparent nausea or vomiting Anesthetic complications: no    Last Vitals:  Vitals:   01/15/18 0610 01/15/18 0836  BP: (!) 147/84 133/78  Pulse: 83 68  Resp:  18  Temp: 36.6 C 36.6 C  SpO2: 93% 97%    Last Pain:  Vitals:   01/15/18 0836  TempSrc: Oral  PainSc:                  Niharika Savino DAVID

## 2018-01-15 NOTE — Consult Note (Signed)
ORTHOPAEDIC CONSULTATION  REQUESTING PHYSICIAN: Mark Graham, Saima, MD  Chief Complaint: Purulent drainage left transmetatarsal amputation.  HPI: Mark Graham is a 62 y.o. male who presents with dehiscence of the left transmetatarsal amputation with purulent drainage and osteomyelitis.  Patient has diabetic insensate neuropathy tobacco use as well.  Past Medical History:  Diagnosis Date  . Arthritis    "probably in my hands" (11/24/2017)  . Cellulitis of left foot 11/24/2017  . High cholesterol   . Hypertension   . Osteomyelitis of left foot (HCC)    Osteomyelitis of left hallux with overlying cellulitis left foot/notes 11/24/2017  . Tobacco abuse   . Tuberculosis ~ 1990   "had tx for it"  . Type II diabetes mellitus (HCC)    Past Surgical History:  Procedure Laterality Date  . AMPUTATION Left 11/25/2017   Procedure: TRANSMETATARSAL AMPUTATION;  Surgeon: Roby LoftsHaddix, Mark P, MD;  Location: [facility] OR;  Service: Orthopedics;  Laterality: Left;  . NO PAST SURGERIES     Social History   Socioeconomic History  . Marital status: Single    Spouse name: Not on file  . Number of children: Not on file  . Years of education: Not on file  . Highest education level: Not on file  Occupational History  . Not on file  Social Needs  . Financial resource strain: Not on file  . Food insecurity:    Worry: Not on file    Inability: Not on file  . Transportation needs:    Medical: Not on file    Non-medical: Not on file  Tobacco Use  . Smoking status: Current Every Day Smoker    Packs/day: 1.00    Years: 46.00    Pack years: 46.00    Types: Cigarettes  . Smokeless tobacco: Former NeurosurgeonUser    Types: Chew  Substance and Sexual Activity  . Alcohol use: Yes    Alcohol/week: 3.6 oz    Types: 6 Cans of beer per week  . Drug use: Yes    Types: Marijuana    Comment: 11/24/2017 "nothing since ~ 2002"  . Sexual activity: Never  Lifestyle  . Physical activity:    Days per week: Not on file    Minutes  per session: Not on file  . Stress: Not on file  Relationships  . Social connections:    Talks on phone: Not on file    Gets together: Not on file    Attends religious service: Not on file    Active member of club or organization: Not on file    Attends meetings of clubs or organizations: Not on file    Relationship status: Not on file  Other Topics Concern  . Not on file  Social History Narrative  . Not on file   Family History  Problem Relation Age of Onset  . Cancer Father   . Cancer Sister   . Diabetes Mellitus II Unknown        States 6 out of 13 of his siblings    - negative except otherwise stated in the family history section No Known Allergies Prior to Admission medications   Medication Sig Start Date End Date Taking? Authorizing Provider  atorvastatin (LIPITOR) 20 MG tablet Take 1 tablet (20 mg total) by mouth daily at 6 PM. 11/29/17  Yes Scherrie GerlachHuang, Jennifer, MD  lisinopril (PRINIVIL,ZESTRIL) 10 MG tablet Take 1 tablet (10 mg total) by mouth daily. 11/30/17  Yes Scherrie GerlachHuang, Jennifer, MD  metFORMIN (GLUCOPHAGE) 500 MG tablet  Take 1 tablet (500 mg total) by mouth 2 (two) times daily with a meal. 11/29/17  Yes Scherrie Gerlach, MD  QUEtiapine (SEROQUEL) 50 MG tablet Take 1 tablet (50 mg total) by mouth at bedtime. 11/29/17  Yes Scherrie Gerlach, MD  Blood Glucose Monitoring Suppl (TRUE METRIX METER) DEVI 1 each by Does not apply route daily. Patient not taking: Reported on 01/13/2018 11/29/17   Scherrie Gerlach, MD   Dg Foot Complete Left  Result Date: 01/13/2018 CLINICAL DATA:  Left foot wound dehiscence following amputation. Edema and erythema. EXAM: LEFT FOOT - COMPLETE 3+ VIEW COMPARISON:  11/25/2017. FINDINGS: Again demonstrated are post amputation changes at the level of the proximal metatarsals. Interval mild bone destruction involving the distal aspect of the remaining portion of the 1st metatarsal. There is also interval mild periosteal new bone formation involving the distal aspect of  the remaining portion of the 5th metatarsal. Diffuse soft tissue swelling with no soft tissue gas. IMPRESSION: Findings suspicious for the possibility of osteomyelitis involving the remaining portion of the 1st metatarsal and 5th metatarsal. Electronically Signed   By: Beckie Salts M.D.   On: 01/13/2018 17:35   - pertinent xrays, CT, MRI studies were reviewed and independently interpreted  Positive ROS: All other systems have been reviewed and were otherwise negative with the exception of those mentioned in the HPI and as above.  Physical Exam: General: Alert, no acute distress Psychiatric: Patient is competent for consent with normal mood and affect Lymphatic: No axillary or cervical lymphadenopathy Cardiovascular: No pedal edema Respiratory: No cyanosis, no use of accessory musculature GI: No organomegaly, abdomen is soft and non-tender  Skin: Examination patient has no ascending cellulitis.  There is purulent drainage with wound dehiscence over the transmetatarsal amputation.  Images:  @ENCIMAGES @   Neurologic: Patient does not have protective sensation bilateral lower extremities.   MUSCULOSKELETAL:  Patient has a good dorsalis pedis and posterior tibial pulse.  Patient's calf is soft there is no evidence of infection proximal to the metatarsal shafts.  Assessment: Assessment: Diabetic insensate neuropathy with tobacco use with wound dehiscence abscess and osteomyelitis of the transmetatarsal amputation.    Plan: Plan: We will plan for revision of the transmetatarsal amputation.  Risks and benefits were discussed including the importance of smoking cessation potential for a transtibial amputation.  Patient states he understands wished to proceed at this time.  Thank you for the consult and the opportunity to see Mr. Mark Sato, MD [facility] [phone] 8:14 AM

## 2018-01-16 ENCOUNTER — Encounter (HOSPITAL_COMMUNITY): Payer: Self-pay | Admitting: Orthopedic Surgery

## 2018-01-16 LAB — CBC
HEMATOCRIT: 29.7 % — AB (ref 39.0–52.0)
HEMOGLOBIN: 9.8 g/dL — AB (ref 13.0–17.0)
MCH: 32 pg (ref 26.0–34.0)
MCHC: 33 g/dL (ref 30.0–36.0)
MCV: 97.1 fL (ref 78.0–100.0)
Platelets: 257 10*3/uL (ref 150–400)
RBC: 3.06 MIL/uL — ABNORMAL LOW (ref 4.22–5.81)
RDW: 15.8 % — ABNORMAL HIGH (ref 11.5–15.5)
WBC: 6.4 10*3/uL (ref 4.0–10.5)

## 2018-01-16 LAB — GLUCOSE, CAPILLARY
GLUCOSE-CAPILLARY: 126 mg/dL — AB (ref 65–99)
GLUCOSE-CAPILLARY: 118 mg/dL — AB (ref 65–99)
Glucose-Capillary: 111 mg/dL — ABNORMAL HIGH (ref 65–99)
Glucose-Capillary: 155 mg/dL — ABNORMAL HIGH (ref 65–99)

## 2018-01-16 LAB — AEROBIC CULTURE W GRAM STAIN (SUPERFICIAL SPECIMEN)

## 2018-01-16 LAB — AEROBIC CULTURE  (SUPERFICIAL SPECIMEN)

## 2018-01-16 MED ORDER — ENOXAPARIN SODIUM 40 MG/0.4ML ~~LOC~~ SOLN
40.0000 mg | SUBCUTANEOUS | Status: DC
  Administered 2018-01-16: 40 mg via SUBCUTANEOUS
  Filled 2018-01-16: qty 0.4

## 2018-01-16 NOTE — Evaluation (Signed)
Physical Therapy Evaluation Patient Details Name: Mark Graham MRN: [id number] DOB: 19-Sep-1956 Today's Date: 01/16/2018   History of Present Illness  Pt is a 62 y.o. male with history of diabetes mellitus type 2, hypertension, hyperlipidemia, and depression.  He had a recent hospitalization for gangrenous left foot after frostbite and underwent a transmetatarsal amputation on 11/25/17.  Pt was eventually discharged to skilled nursing facility and then discharged home.  He is now s/p revision of L transmet amp due to dehiscence and infection.     Clinical Impression  Pt admitted with above diagnosis. Pt currently with functional limitations due to the deficits listed below (see PT Problem List). PTA pt living alone independent with functional mobility. On eval, he required supervision bed mobility (due to lines) and min guard assist transfers. Pt able to maintain NWB LE during pivot transfer with RW bed to recliner. Pt positioned in chair with feet elevated at end of session.  Pt will benefit from skilled PT to increase their independence and safety with mobility to allow discharge to the venue listed below.  Pt's preference is to return home. When questioned regarding who will assist him into the house, he stated he could get some help.       Follow Up Recommendations No PT follow up;Supervision - Intermittent    Equipment Recommendations  Wheelchair cushion (measurements PT);Wheelchair (measurements PT)    Recommendations for Other Services       Precautions / Restrictions Precautions Precautions: Fall Restrictions Weight Bearing Restrictions: Yes LLE Weight Bearing: Non weight bearing      Mobility  Bed Mobility Overal bed mobility: Needs Assistance Bed Mobility: Sit to Supine       Sit to supine: Supervision   General bed mobility comments: supervision for safety/lines  Transfers Overall transfer level: Needs assistance Equipment used: Rolling walker (2 wheeled) Transfers:  Sit to/from [employer] Sit to Stand: Min guard Stand pivot transfers: Min guard       General transfer comment: increased time to stabilize initial standing balance, pivot steps with RW bed to recliner, pt able to maintain NWB LLE  Ambulation/Gait                Stairs            Wheelchair Mobility    Modified Rankin (Stroke Patients Only)       Balance Overall balance assessment: Needs assistance Sitting-balance support: No upper extremity supported;Feet supported Sitting balance-Leahy Scale: Good     Standing balance support: During functional activity;Bilateral upper extremity supported Standing balance-Leahy Scale: Poor Standing balance comment: heavy reliance on RW due to NWB LLE                             Pertinent Vitals/Pain Pain Assessment: 0-10 Pain Score: 9  Pain Location: L foot Pain Descriptors / Indicators: Sore;Aching Pain Intervention(s): Monitored during session;Repositioned    Home Living Family/patient expects to be discharged to:: Private residence Living Arrangements: Alone Available Help at Discharge: Other (Comment)(none) Type of Home: House Home Access: Stairs to enter Entrance Stairs-Rails: None Entrance Stairs-Number of Steps: 4 Home Layout: One level Home Equipment: Walker - 2 wheels      Prior Function Level of Independence: Independent         Comments: He reports living in a dilapidated mobile home and walks where he needs to go.     Hand Dominance   Dominant Hand: Right  Extremity/Trunk Assessment   Upper Extremity Assessment Upper Extremity Assessment: Overall WFL for tasks assessed    Lower Extremity Assessment Lower Extremity Assessment: LLE deficits/detail LLE: Unable to fully assess due to pain    Cervical / Trunk Assessment Cervical / Trunk Assessment: Normal  Communication   Communication: No difficulties  Cognition Arousal/Alertness: Awake/alert Behavior  During Therapy: WFL for tasks assessed/performed Overall Cognitive Status: Within Functional Limits for tasks assessed                                        General Comments      Exercises     Assessment/Plan    PT Assessment Patient needs continued PT services  PT Problem List Decreased mobility;Decreased safety awareness;Decreased knowledge of precautions;Decreased activity tolerance;Pain;Decreased balance;Decreased knowledge of use of DME       PT Treatment Interventions DME instruction;Therapeutic activities;Gait training;Therapeutic exercise;Patient/family education;Balance training;Stair training;Functional mobility training;Wheelchair mobility training    PT Goals (Current goals can be found in the Care Plan section)  Acute Rehab PT Goals Patient Stated Goal: home PT Goal Formulation: With patient Time For Goal Achievement: 01/30/18 Potential to Achieve Goals: Good    Frequency Min 3X/week   Barriers to discharge Decreased caregiver support      Co-evaluation               AM-PAC PT "6 Clicks" Daily Activity  Outcome Measure Difficulty turning over in bed (including adjusting bedclothes, sheets and blankets)?: None Difficulty moving from lying on back to sitting on the side of the bed? : None Difficulty sitting down on and standing up from a chair with arms (e.g., wheelchair, bedside commode, etc,.)?: A Little Help needed moving to and from a bed to chair (including a wheelchair)?: A Little Help needed walking in hospital room?: A Little Help needed climbing 3-5 steps with a railing? : A Lot 6 Click Score: 19    End of Session Equipment Utilized During Treatment: Gait belt Activity Tolerance: Patient tolerated treatment well Patient left: in chair;with call bell/phone within reach Nurse Communication: Mobility status;Weight bearing status PT Visit Diagnosis: Difficulty in walking, not elsewhere classified (R26.2);Pain Pain - Right/Left:  Left Pain - part of body: Ankle and joints of foot    Time: [id number] PT Time Calculation (min) (ACUTE ONLY): 20 min   Charges:   PT Evaluation $PT Eval Moderate Complexity: 1 Mod     PT G Codes:        Aida RaiderWendy Jimya Ciani, PT  Office # [phone] Pager [phone]   Ilda FoilGarrow, Thurman Sarver Rene 01/16/2018, 12:46 PM

## 2018-01-16 NOTE — Progress Notes (Signed)
PROGRESS NOTE    Mark Graham   WUJ:[id number]  DOB: 04/26/1956  DOA: 01/13/2018 PCP: Hoy RegisterNewlin, Enobong, MD   Brief Narrative:  Mark Graham D Service is a 62 y.o. male with history of diabetes mellitus type 2, hypertension, hyperlipidemia, depression who was recently admitted last month for gangrenous left foot after frostbite and underwent a transmetatarsal amputation on 11/25/17 was eventually discharged to skilled nursing facility and then discharged home.  Over the last 2 days he noticed an increasing foul smelling discharge from the wound.   Xray is concerning for osteomylelits of 1st and 5th metatarsals.   Subjective:  c/o pain in his left foot again and wants wound vac off. Overall looks quite comfortable to me. Ate all of his breakfast, no nausea, vomiting, abdominal pain dyspnea, dysuria. No other complaints.    Assessment & Plan:   Principal Problem:   Osteomyelitis of left foot  - underwent a transmetatarsal amputation on 11/25/17 - presents with dehiscence of wound and infection - b/l venous duplex neg on 3/21 -  blood cultures neg   - underwent REVISION TRANS METATARSAL AMPUTATION 3/23 - cont Vanc/ Zosyn for 24 more hrs (after surgery) per ortho- will stop tomorrow AM - further ortho recommendations:  Weightbearing: Strict nonweightbearing on the left (I discussed this with patient) Pain medication: Continue current pain medication. Dressing care/ Wound VAC: Discharge with the portable Praveena wound VAC pump. Ambulatory devices: Walker or crutches Discharge to: Home. - awaiting PT eval today- he states he went to SNF after last surgery   Active Problems: ? 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 01/15/18 0756 01/15/18 0943   01/14/18 1200  vancomycin (VANCOCIN) IVPB 750 mg/150 ml premix     750 mg 150 mL/hr over 60 Minutes Intravenous Every 12 hours 01/13/18 2354     01/14/18 0600  piperacillin-tazobactam (ZOSYN) IVPB 3.375 g     3.375 g 12.5 mL/hr over 240 Minutes Intravenous Every 8 hours 01/13/18 2354     01/14/18 0000  vancomycin (VANCOCIN) 1,500 mg in sodium chloride 0.9 % 500 mL IVPB     1,500 mg 250 mL/hr over 120 Minutes Intravenous  Once 01/13/18 2354 01/14/18 0236   01/14/18 0000  piperacillin-tazobactam (ZOSYN) IVPB 3.375 g     3.375 g 100 mL/hr over 30 Minutes Intravenous  Once 01/13/18 2354 01/14/18 0118       Objective: Vitals:  01/15/18 1353 01/15/18 2025 01/15/18 2309 01/16/18 0647  BP: 135/84 (!) 188/95 (!) 153/82 (!) 143/85  Pulse: 79 (!) 101 89 93  Resp: 14 20 18    Temp: (!) 97.5 F (36.4 C) 98.7 F (37.1 C)  98.8 F (37.1 C)  TempSrc: Oral Oral  Oral  SpO2: 96% 94% 94% 94%  Weight:        Intake/Output Summary (Last 24 hours) at 01/16/2018 0919 Last data filed at 01/16/2018 0152 Gross per 24 hour  Intake 640 ml  Output 1300 ml  Net -660 ml   Filed Weights   01/13/18 1613  Weight: 65.3 kg (144 lb)    Examination: General exam: Appears comfortable -     Respiratory system: Clear to auscultation listened to anteriorly.   Cardiovascular system: RRR Gastrointestinal system: Abdomen soft,  Normal bowel sound.   Central nervous system: AAO x 3, CN 2-12 intact, no focal deficits Extremities:  Wound vac on left foot noted s/p transmetatarsal amputation   Data Reviewed: I have personally reviewed following labs and imaging studies  CBC: Recent Labs  Lab 01/13/18 1623 01/14/18 0440 01/16/18 0642  WBC 7.9 5.4 6.4  NEUTROABS 4.9  --   --   HGB 10.8* 9.8* 9.8*  HCT 32.0* 29.9* 29.7*  MCV 95.5 95.8 97.1  PLT 269 240 257   Basic Metabolic Panel: Recent Labs  Lab 01/13/18 1623 01/14/18 0440  NA 135 137  K 3.6 3.4*  CL 103 103  CO2 24 25  GLUCOSE 178* 144*  BUN 7 7  CREATININE 1.05 1.03  CALCIUM 8.8* 8.4*   GFR: Estimated Creatinine Clearance: 69.6 mL/min (by C-G formula based on SCr of 1.03 mg/dL). Liver Function Tests: Recent Labs  Lab 01/13/18 1623  AST 20  ALT 13*  ALKPHOS 89  BILITOT 0.4  PROT 6.5  ALBUMIN 3.6   No results for input(s): LIPASE, AMYLASE in the last 168 hours. No results for input(s): AMMONIA in the last 168 hours. Coagulation Profile: No results for input(s): INR, PROTIME in the last 168 hours. Cardiac Enzymes: No results for input(s): CKTOTAL, CKMB, CKMBINDEX, TROPONINI in the last 168 hours. BNP (last 3 results) No results for input(s): PROBNP in the last 8760 hours. HbA1C: No results for input(s): HGBA1C in the last 72 hours. CBG: Recent Labs  Lab 01/15/18 0841 01/15/18 1117 01/15/18 1611 01/15/18 2033 01/16/18 0645  GLUCAP 95 85 163* 151* 111*   Lipid Profile: No results for input(s): CHOL, HDL, LDLCALC, TRIG, CHOLHDL, LDLDIRECT in the last 72 hours. Thyroid Function Tests: No results for input(s): TSH, T4TOTAL, FREET4, T3FREE, THYROIDAB in the last 72 hours. Anemia Panel: Recent Labs    01/14/18 0440  VITAMINB12 226  FOLATE 14.2  FERRITIN 107  TIBC 203*  IRON 40*   RETICCTPCT 1.6   Urine analysis:    Component Value Date/Time   COLORURINE YELLOW 01/13/2018 0041   APPEARANCEUR HAZY (A) 01/13/2018 0041   LABSPEC 1.005 01/13/2018 0041   PHURINE 6.0 01/13/2018 0041   GLUCOSEU NEGATIVE 01/13/2018 0041   HGBUR SMALL (A) 01/13/2018 0041   BILIRUBINUR NEGATIVE 01/13/2018 0041   KETONESUR NEGATIVE 01/13/2018 0041   PROTEINUR NEGATIVE 01/13/2018 0041   UROBILINOGEN 1.0 01/15/2015 1946   NITRITE NEGATIVE 01/13/2018 0041   LEUKOCYTESUR LARGE (A) 01/13/2018 0041   Sepsis Labs: @LABRCNTIP (procalcitonin:4,lacticidven:4) ) Recent Results (from the past 240 hour(s))  Blood culture (routine x 2)     Status: None (Preliminary result)   Collection Time: 01/14/18 12:09 AM  Result Value Ref Range Status   Specimen Description BLOOD RIGHT FOREARM  Final   Special Requests   Final    BOTTLES DRAWN AEROBIC AND ANAEROBIC Blood Culture adequate volume   Culture   Final    NO GROWTH 1 DAY Performed at [facility], [address], [place], [place] [address]    Report Status PENDING  Incomplete  Wound or Superficial Culture     Status: None (Preliminary result)   Collection Time: 01/14/18 12:10 AM  Result Value Ref Range Status   Specimen Description WOUND LEFT FOOT  Final   Special Requests Immunocompromised  Final   Gram Stain   Final    FEW WBC PRESENT,BOTH PMN AND MONONUCLEAR ABUNDANT GRAM POSITIVE COCCI ABUNDANT GRAM NEGATIVE RODS    Culture   Final    CULTURE REINCUBATED FOR BETTER GROWTH Performed at [facility], [address], [place], [place] [address]    Report Status PENDING  Incomplete  Blood culture (routine x 2)     Status: None (Preliminary result)   Collection Time: 01/14/18 12:19 AM  Result Value Ref Range Status   Specimen Description BLOOD LEFT ANTECUBITAL  Final   Special Requests   Final    IN PEDIATRIC BOTTLE Blood Culture results may not be optimal due to an excessive volume of blood received in culture bottles    Culture   Final    NO GROWTH 1 DAY Performed at [facility], [address], [place], [place] [address]    Report Status PENDING  Incomplete  Surgical pcr screen     Status: None   Collection Time: 01/15/18  8:25 AM  Result Value Ref Range Status   MRSA, PCR NEGATIVE NEGATIVE Final   Staphylococcus aureus NEGATIVE NEGATIVE Final    Comment: (NOTE) The Xpert SA Assay (FDA approved for NASAL specimens in patients 40 years of age and older), is one component of a comprehensive surveillance program. It is not intended to diagnose infection nor to guide or monitor treatment. Performed at [facility], [address] [place], [place] [address]          Radiology Studies: No results found.    Scheduled Meds: . atorvastatin  20 mg Oral q1800  . docusate sodium  100 mg Oral BID  . insulin aspart  0-9 Units Subcutaneous TID WC  . lisinopril  10 mg Oral Daily  . QUEtiapine  50 mg Oral QHS  . vitamin B-12  1,000 mcg Oral Daily   Continuous Infusions: . sodium chloride    . methocarbamol (ROBAXIN)  IV    . piperacillin-tazobactam (ZOSYN)  IV 3.375 g (01/16/18 0641)  . vancomycin Stopped (01/16/18 0159)     LOS: 2 days    Time spent in minutes: 35    Calvert Cantor, MD [facility] Pager: [email or web address] Password [facility] 01/16/2018, 9:19 AM

## 2018-01-17 LAB — GLUCOSE, CAPILLARY
GLUCOSE-CAPILLARY: 121 mg/dL — AB (ref 65–99)
Glucose-Capillary: 104 mg/dL — ABNORMAL HIGH (ref 65–99)

## 2018-01-17 LAB — BASIC METABOLIC PANEL
ANION GAP: 7 (ref 5–15)
BUN: 8 mg/dL (ref 6–20)
CO2: 26 mmol/L (ref 22–32)
Calcium: 8.7 mg/dL — ABNORMAL LOW (ref 8.9–10.3)
Chloride: 104 mmol/L (ref 101–111)
Creatinine, Ser: 1.21 mg/dL (ref 0.61–1.24)
GFR calc non Af Amer: >60 mL/min (ref >60)
GLUCOSE: 108 mg/dL — AB (ref 65–99)
POTASSIUM: 3.8 mmol/L (ref 3.5–5.1)
Sodium: 137 mmol/L (ref 135–145)

## 2018-01-17 MED ORDER — HYDROCODONE-ACETAMINOPHEN 5-325 MG PO TABS: 1.0000 | ORAL_TABLET | ORAL | 0 refills | Status: DC | PRN

## 2018-01-17 MED ORDER — ACETAMINOPHEN 325 MG PO TABS: 325.0000 mg | ORAL_TABLET | Freq: Four times a day (QID) | ORAL | Status: DC | PRN

## 2018-01-17 MED ORDER — DOCUSATE SODIUM 100 MG PO CAPS: 100.0000 mg | ORAL_CAPSULE | Freq: Two times a day (BID) | ORAL | 0 refills | Status: DC

## 2018-01-17 MED ORDER — POLYETHYLENE GLYCOL 3350 17 G PO PACK: 17.0000 g | PACK | Freq: Every day | ORAL | 0 refills | Status: DC | PRN

## 2018-01-17 MED ORDER — BISACODYL 10 MG RE SUPP: 10.0000 mg | Freq: Every day | RECTAL | 0 refills | Status: DC | PRN

## 2018-01-17 NOTE — Progress Notes (Signed)
Upon discharge, patient stated that his ride was waiting downstairs. Patient was taken down to valet parking via [employer]. Patient then stated that ride was on the way. RN made notified/aware of situation, went downstairs, and educated patient on the risks of smoking and not coming back to his room to wait for ride. Patient refused to come back to floor and wait for ride. RN again repeated the risks, and patient stated "to leave him downstairs to smoke and wait for ride". RN made CN and ChiropodistAssistant Director aware of situation.

## 2018-01-17 NOTE — Discharge Instructions (Signed)
Narcotics like Hydrocodone can cause severe constipation and dizziness, confusion and are highly addictive. Use them sparingly.

## 2018-01-17 NOTE — Progress Notes (Signed)
Physical Therapy Treatment Patient Details Name: Mark Graham MRN: [id number] DOB: 1956/05/18 Today's Date: 01/17/2018    History of Present Illness Pt is a 62 y.o. male with history of diabetes mellitus type 2, hypertension, hyperlipidemia, and depression.  He had a recent hospitalization for gangrenous left foot after frostbite and underwent a transmetatarsal amputation on 11/25/17.  Pt was eventually discharged to skilled nursing facility and then discharged home.  He is now s/p revision of L transmet amp due to dehiscence and infection.     PT Comments    Patient is progressing well toward mobility goals. Pt tolerated gait training for 66ft with RW and able to maintain NWB status. Pt reported 4 steps to enter home and therapist recommended pt bump up steps on his bottom to avoid L LE weight bearing and risk of falling. Pt reported he has ascended/descended steps this way PTA due to severe pain in L foot. Continue to progress as tolerated.    Follow Up Recommendations  No PT follow up;Supervision - Intermittent     Equipment Recommendations  Wheelchair cushion (measurements PT);Wheelchair (measurements PT)(pt has RW )    Recommendations for Other Services       Precautions / Restrictions Precautions Precautions: Fall Restrictions Weight Bearing Restrictions: Yes LLE Weight Bearing: Non weight bearing    Mobility  Bed Mobility               General bed mobility comments: pt OOB in chair upon arrival  Transfers Overall transfer level: Needs assistance Equipment used: Rolling walker (2 wheeled) Transfers: Sit to/from [employer] Sit to Stand: Supervision Stand pivot transfers: Supervision       General transfer comment: cues for safe hand placement  Ambulation/Gait Ambulation/Gait assistance: Supervision Ambulation Distance (Feet): 90 Feet Assistive device: Rolling walker (2 wheeled) Gait Pattern/deviations: Step-to pattern     General Gait  Details: slow, steady gait; pt able to maintain NWB status    Stairs         General stair comments: pt reported having ~4 steps to enter home; discussed bumping up steps on his bottom for safety; unable to practice due to pt needing to use the bathroom   Wheelchair Mobility    Modified Rankin (Stroke Patients Only)       Balance Overall balance assessment: Needs assistance Sitting-balance support: No upper extremity supported;Feet supported Sitting balance-Leahy Scale: Good     Standing balance support: During functional activity;Bilateral upper extremity supported Standing balance-Leahy Scale: Poor Standing balance comment: heavy reliance on RW due to NWB LLE                            Cognition Arousal/Alertness: Awake/alert Behavior During Therapy: WFL for tasks assessed/performed Overall Cognitive Status: Within Functional Limits for tasks assessed                                        Exercises      General Comments        Pertinent Vitals/Pain Pain Assessment: Faces Faces Pain Scale: Hurts a little bit Pain Location: L foot Pain Descriptors / Indicators: Sore Pain Intervention(s): Monitored during session;Repositioned    Home Living                      Prior Function  PT Goals (current goals can now be found in the care plan section) Acute Rehab PT Goals Patient Stated Goal: home PT Goal Formulation: With patient Time For Goal Achievement: 01/30/18 Potential to Achieve Goals: Good Progress towards PT goals: Progressing toward goals    Frequency    Min 3X/week      PT Plan Current plan remains appropriate    Co-evaluation              AM-PAC PT "6 Clicks" Daily Activity  Outcome Measure  Difficulty turning over in bed (including adjusting bedclothes, sheets and blankets)?: None Difficulty moving from lying on back to sitting on the side of the bed? : None Difficulty sitting down  on and standing up from a chair with arms (e.g., wheelchair, bedside commode, etc,.)?: A Little Help needed moving to and from a bed to chair (including a wheelchair)?: A Little Help needed walking in hospital room?: A Little Help needed climbing 3-5 steps with a railing? : A Lot 6 Click Score: 19    End of Session Equipment Utilized During Treatment: Gait belt Activity Tolerance: Patient tolerated treatment well Patient left: with call bell/phone within reach;Other (comment)(pt in restroom-NT aware and pt educated on pulling cord ) Nurse Communication: Mobility status;Weight bearing status PT Visit Diagnosis: Difficulty in walking, not elsewhere classified (R26.2);Pain Pain - Right/Left: Left Pain - part of body: Ankle and joints of foot     Time: [id number] PT Time Calculation (min) (ACUTE ONLY): 19 min  Charges:  $Gait Training: 8-22 mins                    G Codes:       Erline LevineKellyn Emoni Yang, PTA Pager: [phone]     Carolynne EdouardKellyn R Pharoah Goggins 01/17/2018, 9:14 AM

## 2018-01-17 NOTE — Progress Notes (Signed)
Orthopedic Tech Progress Note Patient Details:  Deberah CastleJerry D Sitar 01/12/1956 [id number]  Ortho Devices Type of Ortho Device: Crutches Ortho Device/Splint Interventions: Application   Post Interventions Patient Tolerated: Well Instructions Provided: Care of device   Nikki DomCrawford, Delyla Sandeen 01/17/2018, 11:28 AM

## 2018-01-17 NOTE — Care Management Note (Signed)
Case Management Note  Patient Details  Name: Mark Graham MRN: [id number] Date of Birth: 09/10/1956  Subjective/Objective:     62 yr old gentleman s/p revision of left foot transmetatarsal amputation.                Action/Plan: Case manager spoke with patient concerning discharge plan and DME. He does not need Home Health services. DMe ahs been ordered. Patient says he will be "ok" at discharge.    Expected Discharge Date:  01/17/18               Expected Discharge Plan:   Home/self care  In-House Referral:     Discharge planning Services     Post Acute Care Choice:    Choice offered to:     DME Arranged:   Wheelchair/crutches DME Agency:   [facility]   HH Arranged:   NA HH Agency:     Status of Service:   Completed  If discussed at Long Length of Stay Meetings, dates discussed:    Additional Comments:  Mark Graham, Mark Corvin [name], RN 01/17/2018, 12:17 PM

## 2018-01-17 NOTE — Progress Notes (Addendum)
Mark CastleJerry D Graham to be D/C'd Home per MD order.  Discussed prescriptions and follow up appointments with the patient. Prescriptions given to patient, medication list explained in detail. Pt verbalized understanding.  Allergies as of 01/17/2018   No Known Allergies     Medication List    TAKE these medications   acetaminophen 325 MG tablet Commonly known as:  TYLENOL Take 1-2 tablets (325-650 mg total) by mouth every 6 (six) hours as needed for mild pain (pain score 1-3 or temp > 100.5).   atorvastatin 20 MG tablet Commonly known as:  LIPITOR Take 1 tablet (20 mg total) by mouth daily at 6 PM.   bisacodyl 10 MG suppository Commonly known as:  DULCOLAX Place 1 suppository (10 mg total) rectally daily as needed for moderate constipation.   docusate sodium 100 MG capsule Commonly known as:  COLACE Take 1 capsule (100 mg total) by mouth 2 (two) times daily.   HYDROcodone-acetaminophen 5-325 MG tablet Commonly known as:  NORCO/VICODIN Take 1 tablet by mouth every 4 (four) hours as needed for moderate pain (pain score 4-6).   lisinopril 10 MG tablet Commonly known as:  PRINIVIL,ZESTRIL Take 1 tablet (10 mg total) by mouth daily.   metFORMIN 500 MG tablet Commonly known as:  GLUCOPHAGE Take 1 tablet (500 mg total) by mouth 2 (two) times daily with a meal.   polyethylene glycol packet Commonly known as:  MIRALAX Take 17 g by mouth daily as needed for mild constipation.   QUEtiapine 50 MG tablet Commonly known as:  SEROQUEL Take 1 tablet (50 mg total) by mouth at bedtime.   TRUE METRIX METER Devi 1 each by Does not apply route daily.            Durable Medical Equipment  (From admission, onward)        Start     Ordered   01/17/18 1035  For home use only DME Crutches  Once     01/17/18 1034   01/17/18 0906  For home use only DME standard manual wheelchair with seat cushion  Once    Comments:  Patient suffers from left foot amputtion which impairs their ability to  perform daily activities like ambulating long distances .  A crutch or walker will not resolve. issue with performing activities of daily living. A wheelchair will allow patient to safely perform daily activities. Patient can safely propel the wheelchair in the home or has a caregiver who can provide assistance.  Accessories: elevating leg rests (ELRs), wheel locks, extensions and anti-tippers.   01/17/18 0906   01/17/18 0906  For home use only DME wheelchair cushion (seat and back)  Once     01/17/18 0906       Discharge Care Instructions  (From admission, onward)        Start     Ordered   01/17/18 0000  Non weight bearing    Question Answer Comment  Laterality left   Extremity Lower      01/17/18 0915      Vitals:   01/17/18 0614 01/17/18 1300  BP: 124/78 122/70  Pulse: 76 77  Resp: 17 16  Temp: 98 F (36.7 C) 98.6 F (37 C)  SpO2: 93% 94%    Skin clean, dry and intact without evidence of skin break down, no evidence of skin tears noted. Prevena wound vac in place, patient educated. Fully charged. IV catheter discontinued intact. Site without signs and symptoms of complications. Dressing and pressure applied. Pt  denies pain at this time. No complaints noted.  An After Visit Summary and prescriptions were printed and given to the patient. Patient escorted via WC , and D/C home via private auto.  [place] Labib Cwynar RN

## 2018-01-17 NOTE — Progress Notes (Signed)
Patient ID: Mark Graham, male   DOB: 07/23/1956, 62 y.o.   MRN: [id number] Patient is a 62 year old gentleman who is status post revision transmetatarsal amputation on Saturday.  The wound VAC is functioning well there is no drainage.  Patient will need to be strict nonweightbearing on the left.  He will need to discharge with the portable Praveena wound pump.  This will remain in place for 1 week.  Anticipate patient will need skilled nursing care for discharge.  Patient was previously discharged to skilled nursing.

## 2018-01-17 NOTE — Discharge Summary (Signed)
Physician Discharge Summary  Mark Graham:[id number] DOB: 1955/12/27 DOA: 01/13/2018  PCP: Hoy Register, MD  Admit date: 01/13/2018 Discharge date: 01/17/2018  Admitted From: home Disposition:  home   Recommendations for Outpatient Follow-up:  1. F/u Hb in 1 month  Home Health:  None needed Equipment/Devices:  Wheelchair and cushion    Discharge Condition:  stable   CODE STATUS:  Full code   Consultations:  ortho    Discharge Diagnoses:  Principal Problem:   Osteomyelitis of left foot (HCC) Active Problems:   Hypertension   S/P transmetatarsal amputation of foot, left (HCC)   Diabetes mellitus type 2 in nonobese (HCC)   Normochromic normocytic anemia   Foot osteomyelitis, left (HCC)    Subjective: Despite our discussion yesterday and his discussion with Dr Lajoyce Corners, patient asking me again if he will go home with a wound vac. I have stated again that he will.   Brief Summary: Mark Graham a 62 y.o.malewithhistory of diabetes mellitus type 2, hypertension, hyperlipidemia, depression who was recently admitted last month for gangrenous left foot after frostbite and underwent a transmetatarsal amputation on 11/25/17 was eventually discharged to skilled nursing facility and then discharged home. Over the last 2 days he noticed an increasing foul smelling discharge from the wound.  Xray is concerning for osteomylelits of 1st and 5th metatarsals.    Hospital Course:  Principal Problem:   Osteomyelitis of left foot  - underwent a transmetatarsal amputation on 11/25/17 - presents with dehiscence of wound and infection - b/l venous duplex neg on 3/21 -  blood cultures neg  - underwent REVISION TRANS METATARSAL AMPUTATION 3/23 - cont Vanc/ Zosyn for 24 more hrs (after surgery) per ortho- has been stopped - further ortho recommendations:  Weightbearing:Strict nonweightbearing on the left (I discussed this with patient) Pain medication:Continue current  pain medication. Dressing care/ Wound XBJ:[id number] with the portable Praveena wound VAC pump. Ambulatory devices:Walker or crutches Discharge HY:QMVH. - PT eval done- he does not need PT at home and needs only intermittent supervision- wheelchair and cushion need to be ordered (for long distances)   Active Problems: ? 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  - With low normal B12 at 226-  replaced with s/c B12 1000 mcg on 3/22 - discussed with patient - CBC shows Hb is now stable at 9.8      Discharge Exam: Vitals:   01/16/18 1940 01/17/18 0614  BP: (!) 145/79 124/78  Pulse: 79 76  Resp: 18 17  Temp: 97.9 F (36.6 C) 98 F (36.7 C)  SpO2: 97% 93%   Vitals:   01/16/18 0647 01/16/18 1226 01/16/18 1940 01/17/18 0614  BP: (!) 143/85 132/80 (!) 145/79 124/78  Pulse: 93 83 79 76  Resp:  18 18 17   Temp: 98.8 F (37.1 C) (!) 97.5 F (36.4 C) 97.9 F (36.6 C) 98 F (36.7 C)  TempSrc: Oral Oral Oral Oral  SpO2: 94% 94% 97% 93%  Weight:  General: Pt is alert, awake, not in acute distress Cardiovascular: RRR, S1/S2 +, no rubs, no gallops Respiratory: CTA bilaterally, no wheezing, no rhonchi Abdominal: Soft, NT, ND, bowel sounds + Extremities: no edema, no cyanosis   Discharge Instructions   Allergies as of 01/17/2018   No Known Allergies     Medication List    TAKE these medications   acetaminophen 325 MG tablet Commonly known as:  TYLENOL Take 1-2 tablets (325-650 mg total) by mouth every 6 (six) hours as needed for mild pain (pain score  1-3 or temp > 100.5).   atorvastatin 20 MG tablet Commonly known as:  LIPITOR Take 1 tablet (20 mg total) by mouth daily at 6 PM.   bisacodyl 10 MG suppository Commonly known as:  DULCOLAX Place 1 suppository (10 mg total) rectally daily as needed for moderate constipation.   docusate sodium 100 MG capsule Commonly known as:  COLACE Take 1 capsule (100 mg total) by mouth 2 (two) times daily.   HYDROcodone-acetaminophen 5-325 MG tablet Commonly known as:  NORCO/VICODIN Take 1 tablet by mouth every 4 (four) hours as needed for moderate pain (pain score 4-6).   lisinopril 10 MG tablet Commonly known as:  PRINIVIL,ZESTRIL Take 1 tablet (10 mg total) by mouth daily.   metFORMIN 500 MG tablet Commonly known as:  GLUCOPHAGE Take 1 tablet (500 mg total) by mouth 2 (two) times daily with a meal.   QUEtiapine 50 MG tablet Commonly known as:  SEROQUEL Take 1 tablet (50 mg total) by mouth at bedtime.   TRUE METRIX METER Devi 1 each by Does not apply route daily.      Follow-up Information    Nadara Mustard, MD In 1 week.   Specialty:  Orthopedic Surgery Contact information: [address] [place] [address] [phone]          No Known Allergies   Procedures/Studies:  REVISION TRANS METATARSAL AMPUTATION 3/23 Dg Foot Complete Left  Result Date: 01/13/2018 CLINICAL DATA:  Left foot wound dehiscence following amputation. Edema and erythema. EXAM: LEFT FOOT - COMPLETE 3+ VIEW COMPARISON:  11/25/2017. FINDINGS: Again demonstrated are post amputation changes at the level of the proximal metatarsals. Interval mild bone destruction involving the distal aspect of the remaining portion of the 1st metatarsal. There is also interval mild periosteal new bone formation involving the distal aspect of the remaining portion of the 5th metatarsal. Diffuse soft tissue swelling with no soft tissue gas. IMPRESSION: Findings suspicious for the possibility of osteomyelitis  involving the remaining portion of the 1st metatarsal and 5th metatarsal. Electronically Signed   By: Beckie Salts M.D.   On: 01/13/2018 17:35     The results of significant diagnostics from this hospitalization (including imaging, microbiology, ancillary and laboratory) are listed below for reference.     Microbiology: Recent Results (from the past 240 hour(s))  Blood culture (routine x 2)     Status: None (Preliminary result)   Collection Time: 01/14/18 12:09 AM  Result Value Ref Range Status   Specimen Description BLOOD RIGHT FOREARM  Final   Special Requests   Final    BOTTLES DRAWN AEROBIC AND ANAEROBIC Blood Culture adequate volume   Culture   Final    NO GROWTH 2 DAYS Performed at [facility], [address], [place], [place] [address]    Report Status PENDING  Incomplete  Wound or Superficial Culture     Status: Abnormal   Collection Time: 01/14/18 12:10 AM  Result Value Ref Range Status   Specimen Description WOUND LEFT FOOT  Final   Special Requests Immunocompromised  Final   Gram Stain   Final    FEW WBC PRESENT,BOTH PMN AND MONONUCLEAR ABUNDANT GRAM POSITIVE COCCI ABUNDANT GRAM NEGATIVE RODS Performed at [facility], [address] [place], [place] [address]    Culture MULTIPLE ORGANISMS PRESENT, NONE PREDOMINANT (A)  Final   Report Status 01/16/2018 FINAL  Final  Blood culture (routine x 2)     Status: None (Preliminary result)   Collection Time: 01/14/18 12:19 AM  Result Value Ref Range Status   Specimen Description BLOOD LEFT ANTECUBITAL  Final   Special Requests   Final    IN PEDIATRIC BOTTLE Blood Culture results may not be optimal due to an excessive volume of blood received in culture bottles   Culture   Final    NO GROWTH 2 DAYS Performed at [facility], [address] [place], [place] [address]    Report Status PENDING  Incomplete  Surgical pcr screen     Status: None   Collection Time: 01/15/18  8:25 AM  Result Value Ref Range  Status   MRSA, PCR NEGATIVE NEGATIVE Final   Staphylococcus aureus NEGATIVE NEGATIVE Final    Comment: (NOTE) The Xpert SA Assay (FDA approved for NASAL specimens in patients 62 years of age and older), is one component of a comprehensive surveillance program. It is not intended to diagnose infection nor to guide or monitor treatment. Performed at [facility], [address] [place], [place] [address]      Labs: BNP (last 3 results) No results for input(s): BNP in the last 8760 hours. Basic Metabolic Panel: Recent Labs  Lab 01/13/18 1623 01/14/18 0440 01/17/18 0651  NA 135 137 137  K 3.6 3.4* 3.8  CL 103 103 104  CO2 24 25 26   GLUCOSE 178* 144* 108*  BUN 7 7 8   CREATININE 1.05 1.03 1.21  CALCIUM 8.8* 8.4* 8.7*   Liver Function Tests: Recent Labs  Lab 01/13/18 1623  AST 20  ALT 13*  ALKPHOS 89  BILITOT 0.4  PROT 6.5  ALBUMIN 3.6   No results for input(s): LIPASE, AMYLASE in the last 168 hours. No results for input(s): AMMONIA in the last 168 hours. CBC: Recent Labs  Lab 01/13/18 1623 01/14/18 0440 01/16/18 0642  WBC 7.9 5.4 6.4  NEUTROABS 4.9  --   --   HGB 10.8* 9.8* 9.8*  HCT 32.0* 29.9* 29.7*  MCV 95.5 95.8 97.1  PLT 269 240 257   Cardiac Enzymes: No results for input(s): CKTOTAL, CKMB, CKMBINDEX, TROPONINI in the last 168 hours. BNP: Invalid input(s): POCBNP CBG: Recent Labs  Lab 01/16/18 0645 01/16/18 1101 01/16/18 1648 01/16/18 2112 01/17/18 0612  GLUCAP 111* 155* 126* 118* 104*   D-Dimer No results for input(s): DDIMER in the last 72 hours. Hgb A1c No results for input(s): HGBA1C in the last 72 hours. Lipid Profile No results for input(s): CHOL, HDL, LDLCALC, TRIG, CHOLHDL, LDLDIRECT in the last 72 hours. Thyroid function studies No results for input(s): TSH, T4TOTAL, T3FREE, THYROIDAB in the last 72 hours.  Invalid input(s): FREET3 Anemia work up No results for input(s): VITAMINB12, FOLATE, FERRITIN, TIBC, IRON,  RETICCTPCT in the last 72 hours. Urinalysis    Component Value Date/Time   COLORURINE YELLOW 01/13/2018 0041   APPEARANCEUR HAZY (A) 01/13/2018 0041   LABSPEC 1.005 01/13/2018 0041   PHURINE 6.0 01/13/2018 0041  GLUCOSEU NEGATIVE 01/13/2018 0041   HGBUR SMALL (A) 01/13/2018 0041   BILIRUBINUR NEGATIVE 01/13/2018 0041   KETONESUR NEGATIVE 01/13/2018 0041   PROTEINUR NEGATIVE 01/13/2018 0041   UROBILINOGEN 1.0 01/15/2015 1946   NITRITE NEGATIVE 01/13/2018 0041   LEUKOCYTESUR LARGE (A) 01/13/2018 0041   Sepsis Labs Invalid input(s): PROCALCITONIN,  WBC,  LACTICIDVEN Microbiology Recent Results (from the past 240 hour(s))  Blood culture (routine x 2)     Status: None (Preliminary result)   Collection Time: 01/14/18 12:09 AM  Result Value Ref Range Status   Specimen Description BLOOD RIGHT FOREARM  Final   Special Requests   Final    BOTTLES DRAWN AEROBIC AND ANAEROBIC Blood Culture adequate volume   Culture   Final    NO GROWTH 2 DAYS Performed at [facility], [address], [place], [place] [address]    Report Status PENDING  Incomplete  Wound or Superficial Culture     Status: Abnormal   Collection Time: 01/14/18 12:10 AM  Result Value Ref Range Status   Specimen Description WOUND LEFT FOOT  Final   Special Requests Immunocompromised  Final   Gram Stain   Final    FEW WBC PRESENT,BOTH PMN AND MONONUCLEAR ABUNDANT GRAM POSITIVE COCCI ABUNDANT GRAM NEGATIVE RODS Performed at [facility], [address] [place], [place] [address]    Culture MULTIPLE ORGANISMS PRESENT, NONE PREDOMINANT (A)  Final   Report Status 01/16/2018 FINAL  Final  Blood culture (routine x 2)     Status: None (Preliminary result)   Collection Time: 01/14/18 12:19 AM  Result Value Ref Range Status   Specimen Description BLOOD LEFT ANTECUBITAL  Final   Special Requests   Final    IN PEDIATRIC BOTTLE Blood Culture results may not be optimal due to an excessive volume of blood received in  culture bottles   Culture   Final    NO GROWTH 2 DAYS Performed at [facility], [address] [place], [place] [address]    Report Status PENDING  Incomplete  Surgical pcr screen     Status: None   Collection Time: 01/15/18  8:25 AM  Result Value Ref Range Status   MRSA, PCR NEGATIVE NEGATIVE Final   Staphylococcus aureus NEGATIVE NEGATIVE Final    Comment: (NOTE) The Xpert SA Assay (FDA approved for NASAL specimens in patients 22 years of age and older), is one component of a comprehensive surveillance program. It is not intended to diagnose infection nor to guide or monitor treatment. Performed at [facility], [address], [place], [place] [address]      Time coordinating discharge: Over 30 minutes  SIGNED:   Calvert Cantor, MD  [facility] 01/17/2018, 9:02 AM Pager   If 7PM-7AM, please contact night-coverage [email or web address] Password [email or web address]

## 2018-01-17 NOTE — Progress Notes (Signed)
Orthopedic Tech Progress Note Patient Details:  Mark Graham 03/27/1956 [id number]  Patient ID: Mark Graham, male   DOB: 11/14/1955, 62 y.o.   MRN: [id number]   Nikki DomCrawford, Belva Koziel 01/17/2018, 11:29 AM Viewed order from doctor's order list

## 2018-01-18 ENCOUNTER — Telehealth: Payer: Self-pay

## 2018-01-18 NOTE — Telephone Encounter (Signed)
[facility] Post-discharge Follow-Up Phone Call:  Date of Discharge: 01/17/2018 Principal Discharge Diagnosis(es): osteomyelitis left foot, DM, HT Post-discharge Communication: (Clearly document all attempts clearly and date contact made) call placed to the patient # [phone] and a HIPAA compliant voicemail message was left requesting a call back to # [phone].  Call placed to the patient's brother, Neale BurlyDavid, [place] # [phone] and a HIPAA compliant voicemail message was left  Requesting a call back to the numbers noted above. Call also placed to the patient's sister, Velna HatchetSheila and she stated that the patient was not with her  But she would give him a message to return the call to this CM when she sees him.  Call Completed: No

## 2018-01-19 ENCOUNTER — Ambulatory Visit: Payer: Self-pay | Admitting: Family Medicine

## 2018-01-19 ENCOUNTER — Telehealth: Payer: Self-pay

## 2018-01-19 LAB — CULTURE, BLOOD (ROUTINE X 2)
CULTURE: NO GROWTH
Culture: NO GROWTH
SPECIAL REQUESTS: ADEQUATE

## 2018-01-19 NOTE — Telephone Encounter (Signed)
[facility]itional [facility] Clinic Post-discharge Follow-Up Phone Call:  Date of Discharge: 01/17/2018 Principal Discharge Diagnosis(es): osteomyelitis left foot, DM, HT Post-discharge Communication: (Clearly document all attempts clearly and date contact made) call placed to the patient # [phone] and a HIPAA compliant voicemail message was left requesting a call back to # [phone]. [facility] and transportation can be provided to the clinic if needed.  Call Completed: No

## 2018-01-20 ENCOUNTER — Telehealth: Payer: Self-pay | Admitting: Family Medicine

## 2018-01-20 NOTE — Telephone Encounter (Signed)
Call placed to patient #[phone], to check on his status. Call went straight to vm. Left patient a message asking him to return my call at [phone].  Call placed to [facility] #[phone], to check if patient was at facility since we haven't been able to contact patient, spoke with both [place] and [place] and they both stated that patient was not at the facility.

## 2018-01-21 ENCOUNTER — Telehealth: Payer: Self-pay

## 2018-01-21 NOTE — Telephone Encounter (Signed)
Transitional [facility] Clinic Post-discharge Follow-Up Phone Call:  Date of Discharge:01/17/2018 Principal Discharge Diagnosis(es):osteomyelitis left foot, DM, HT Post-discharge Communication: (Clearly document all attempts clearly and date contact made)call placed to the patient # [phone] and a HIPAA compliant voicemail message was left requesting a call back to # [phone]. Call Completed:No   Call placed to [facility] to inquire if the patient has scheduled an appointment with Dr Lajoyce Cornersuda as instructed and if he has leave a message requesting he contact [facility]. Spoke to [place] Island[place]eredith who stated that he has not scheduled an appointment yet.  As per Georgiana ShoreLuke Van Ausdell, RPH/[facility],  the patient has not picked up any medications at the pharmacy since the beginning of 11/2017

## 2018-01-24 ENCOUNTER — Telehealth: Payer: Self-pay

## 2018-01-24 NOTE — Telephone Encounter (Signed)
Transitional Care Clinic Post-discharge Follow-Up Phone Call:  Date of Discharge:01/17/2018 Principal Discharge Diagnosis(es):osteomyelitis left foot, DM, HTN Post-discharge Communication: (Clearly document all attempts clearly and date contact made)call placed to the patient # [phone] and a HIPAA compliant voicemail message was left requesting a call back to # [phone]. Call Completed:No

## 2018-01-25 IMAGING — US US RENAL
1 series · 14 of 25 positions shown · non-contrast
Comparison: 08/12/2017

CLINICAL DATA: Urinary retention since [REDACTED], history
hypertension, diabetes mellitus, smoking

EXAM:
RENAL / URINARY TRACT ULTRASOUND COMPLETE

[Series 1: us renal · 0.22mm/px · 14 of 37 slices shown]
[im 1/37]
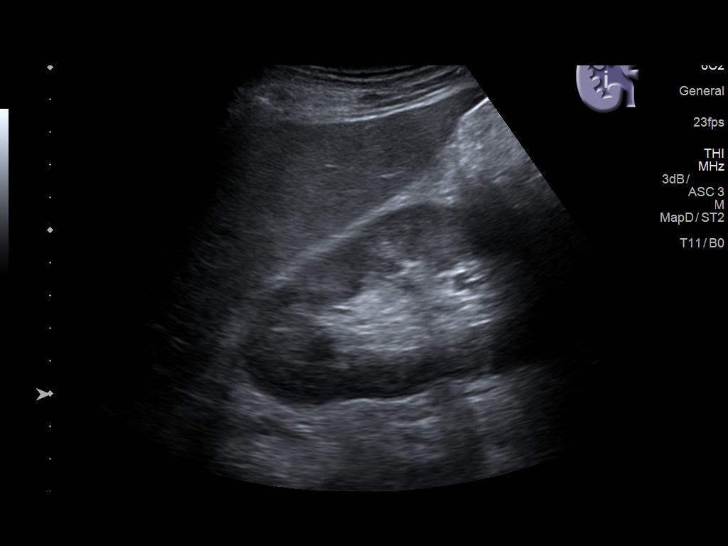
[im 4/37]
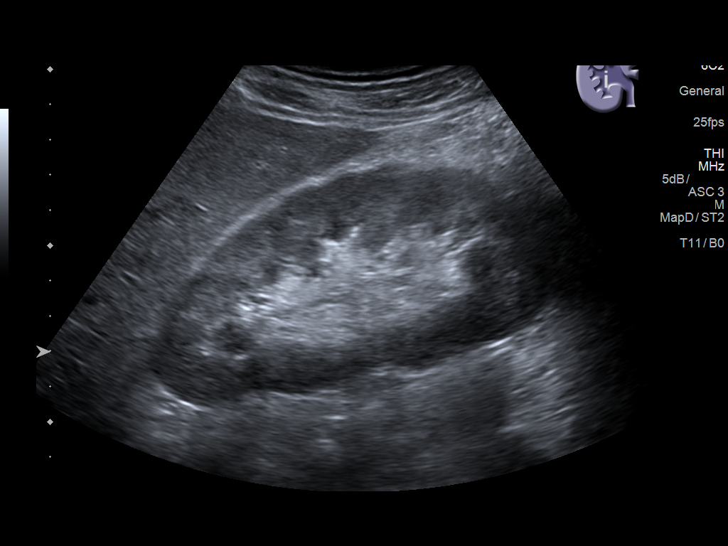
[im 7/37]
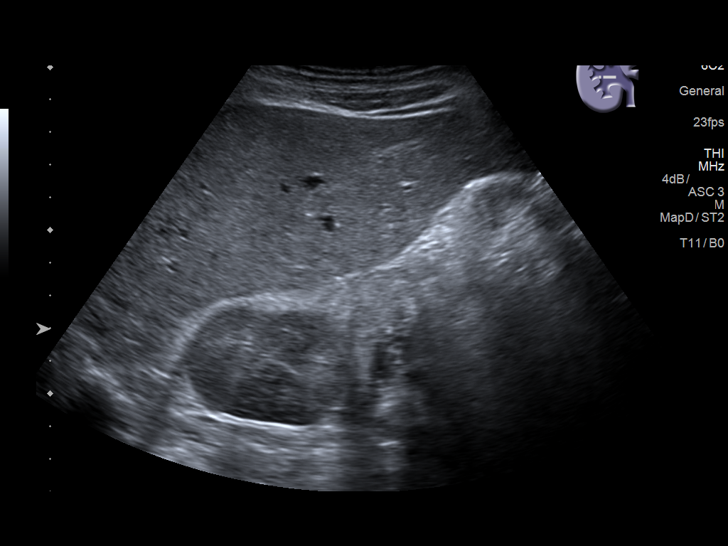
[im 10/37]
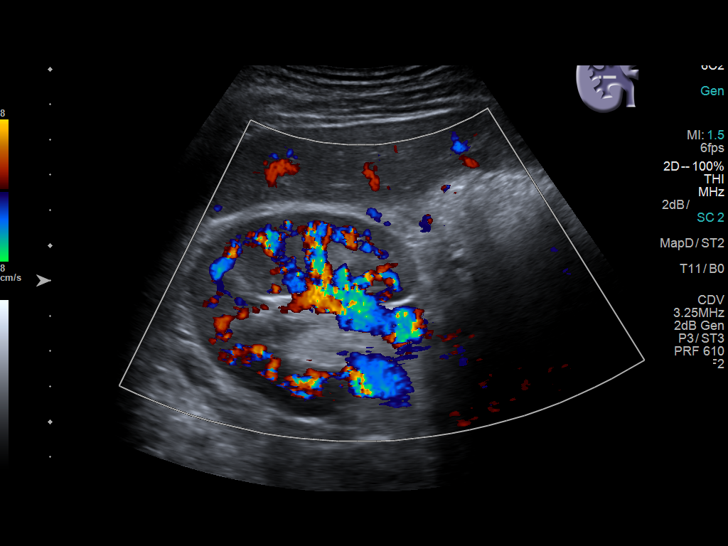
[im 13/37]
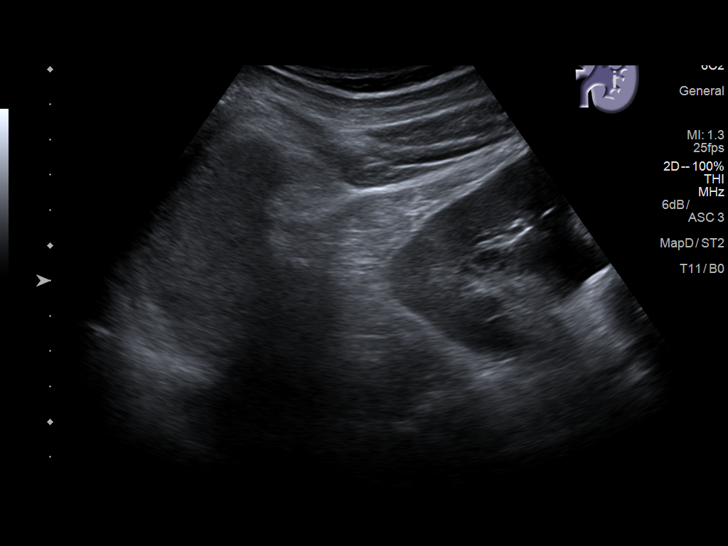
[im 14/37]
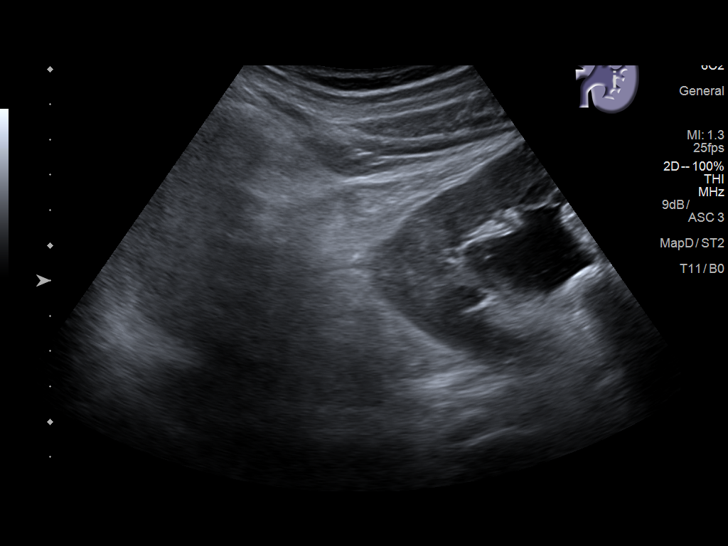
[im 17/37]
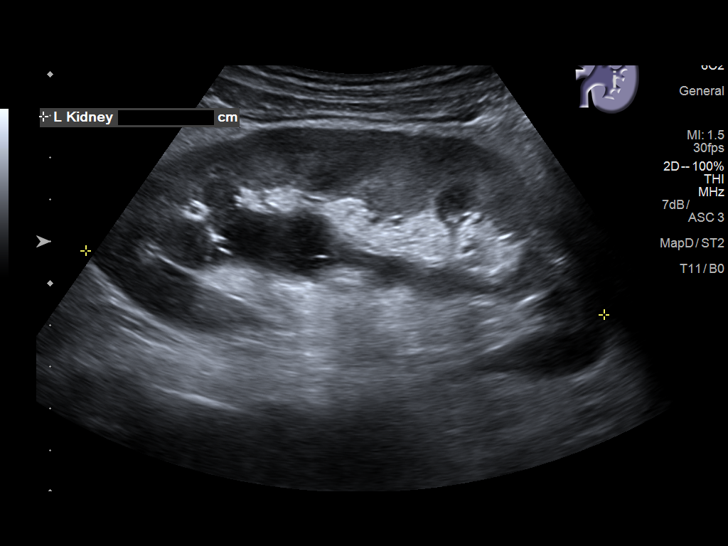
[im 20/37]
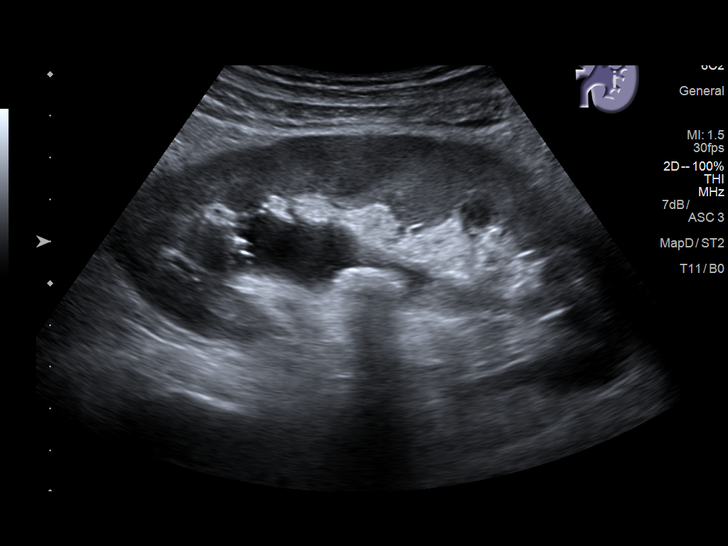
[im 23/37]
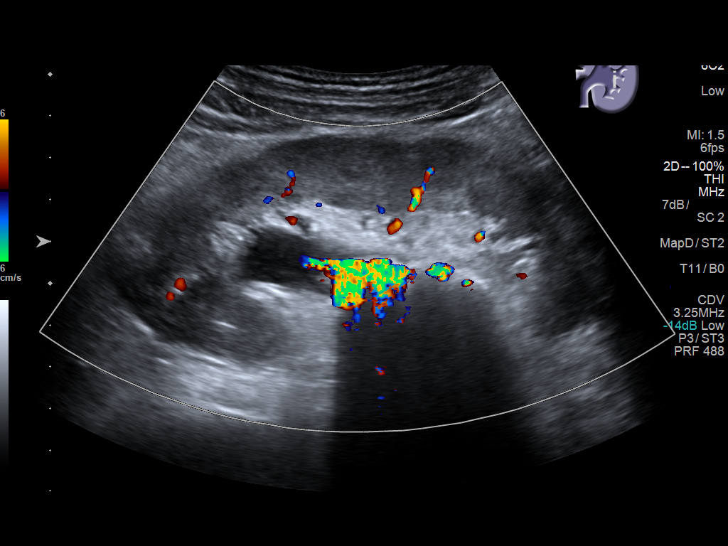
[im 25/37]
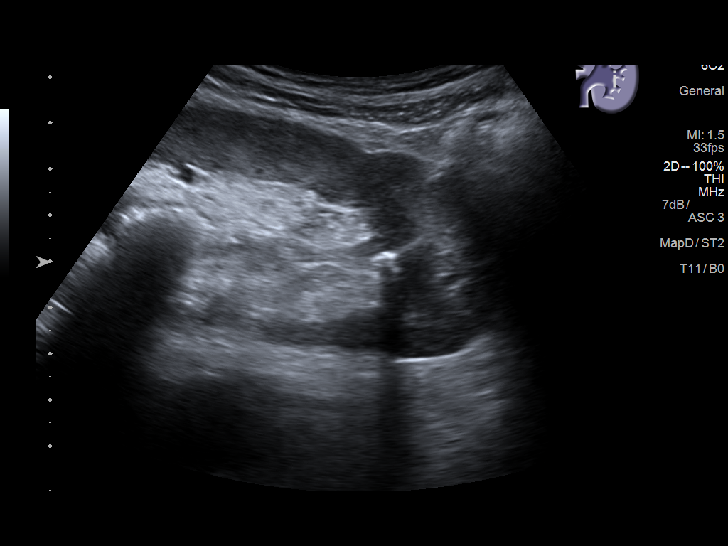
[im 28/37]
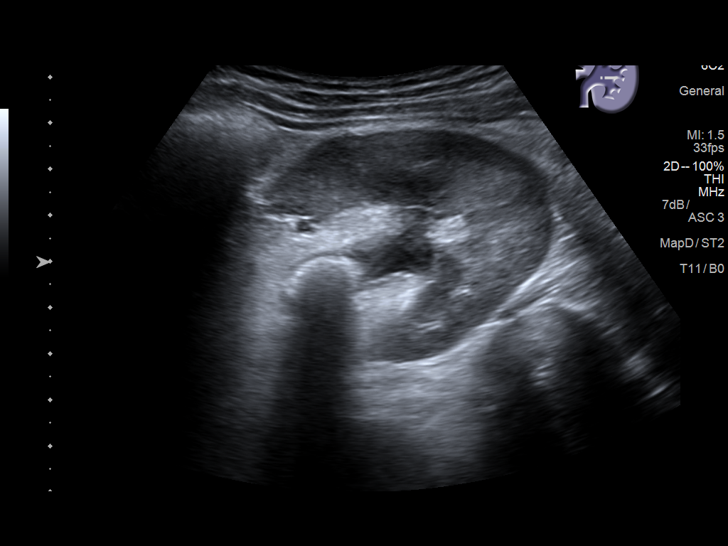
[im 31/37]
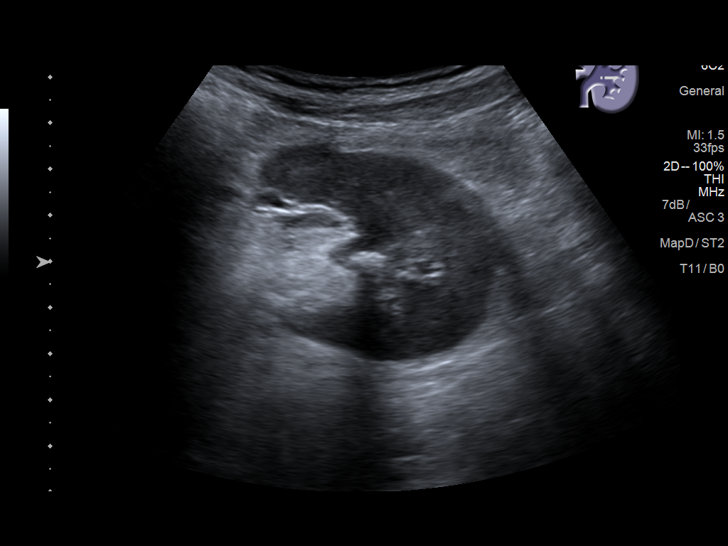
[im 34/37]
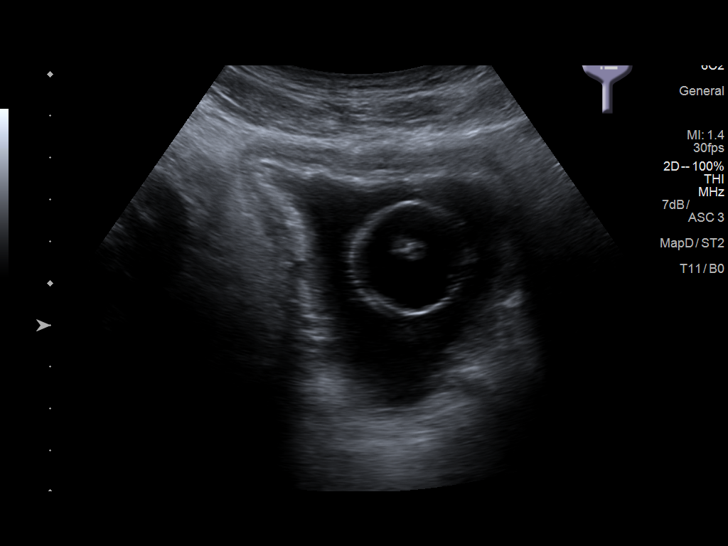
[im 37/37]
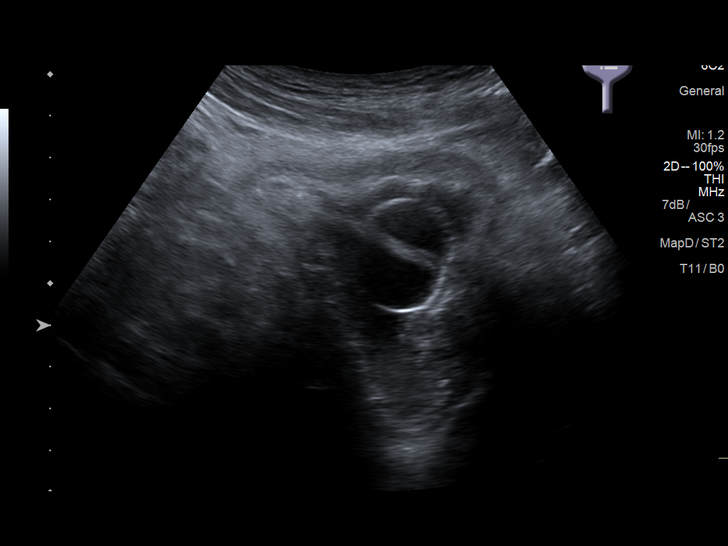

[14 of 25 positions shown; findings below may reference images not displayed]

FINDINGS: Right Kidney:

Length: 11.6 cm. Normal cortical thickness and echogenicity. No
mass, hydronephrosis or shadowing calcification.

Left Kidney:

Length: 12.5 cm. Normal cortical thickness and echogenicity. Two
large calculi at the RIGHT renal pelvis larger 22 mm diameter. Mild
hydronephrosis. No renal mass lesion.

Bladder:

Contains a Foley catheter and minimal urine.
IMPRESSION: Mild LEFT hydronephrosis.

2 large calculi at renal pelvis larger 22 mm diameter.

## 2018-03-03 ENCOUNTER — Telehealth: Payer: Self-pay

## 2018-03-03 NOTE — Telephone Encounter (Signed)
Message received from the patient requesting an appointment.  Returned call and informed him that he has an appointment scheduled for 03/07/18 @ 1430.   He stated that he will be taking the bus to the clinic. He noted that he has a shoe with velcro and he able to walk.  He said that there are no open areas on his feet. The surgical site is scabbed over. He also said that he has not been taking any medication, does not have any medication and has not been checking his blood sugar. He went on to explain that has a prescription from the rehab facility that he never had filled.  This CM stressed the importance of keeping his appointment on Monday and bringing the unfilled prescription with him for Dr Alvis Lemmings to review.

## 2018-03-04 NOTE — Telephone Encounter (Signed)
Noted  

## 2018-03-07 ENCOUNTER — Encounter (HOSPITAL_COMMUNITY): Payer: Self-pay | Admitting: Emergency Medicine

## 2018-03-07 ENCOUNTER — Inpatient Hospital Stay (HOSPITAL_COMMUNITY)
Admission: EM | Admit: 2018-03-07 | Discharge: 2018-03-11 | DRG: 920 | Disposition: A | Discharge status: Home Health | Payer: MEDICAID | Attending: Internal Medicine | Admitting: Internal Medicine

## 2018-03-07 ENCOUNTER — Emergency Department (HOSPITAL_COMMUNITY): Payer: MEDICAID

## 2018-03-07 ENCOUNTER — Encounter: Payer: Self-pay | Admitting: Family Medicine

## 2018-03-07 ENCOUNTER — Telehealth: Payer: Self-pay

## 2018-03-07 ENCOUNTER — Ambulatory Visit: Payer: Self-pay | Attending: Family Medicine | Admitting: Family Medicine

## 2018-03-07 ENCOUNTER — Other Ambulatory Visit: Payer: Self-pay

## 2018-03-07 VITALS — BP 157/101 | HR 104 | Temp 97.5°F | Ht 71.0 in | Wt 154.8 lb

## 2018-03-07 DIAGNOSIS — F17210 Nicotine dependence, cigarettes, uncomplicated: Secondary | ICD-10-CM | POA: Diagnosis present

## 2018-03-07 DIAGNOSIS — E0800 Diabetes mellitus due to underlying condition with hyperosmolarity without nonketotic hyperglycemic-hyperosmolar coma (NKHHC): Secondary | ICD-10-CM

## 2018-03-07 DIAGNOSIS — E118 Type 2 diabetes mellitus with unspecified complications: Secondary | ICD-10-CM | POA: Diagnosis present

## 2018-03-07 DIAGNOSIS — I10 Essential (primary) hypertension: Secondary | ICD-10-CM | POA: Diagnosis present

## 2018-03-07 DIAGNOSIS — Z720 Tobacco use: Secondary | ICD-10-CM

## 2018-03-07 DIAGNOSIS — Z794 Long term (current) use of insulin: Secondary | ICD-10-CM

## 2018-03-07 DIAGNOSIS — Z89422 Acquired absence of other left toe(s): Secondary | ICD-10-CM | POA: Insufficient documentation

## 2018-03-07 DIAGNOSIS — Y835 Amputation of limb(s) as the cause of abnormal reaction of the patient, or of later complication, without mention of misadventure at the time of the procedure: Secondary | ICD-10-CM | POA: Diagnosis present

## 2018-03-07 DIAGNOSIS — Z91199 Patient's noncompliance with other medical treatment and regimen due to unspecified reason: Secondary | ICD-10-CM

## 2018-03-07 DIAGNOSIS — E7800 Pure hypercholesterolemia, unspecified: Secondary | ICD-10-CM | POA: Diagnosis present

## 2018-03-07 DIAGNOSIS — T8130XA Disruption of wound, unspecified, initial encounter: Principal | ICD-10-CM | POA: Diagnosis present

## 2018-03-07 DIAGNOSIS — F329 Major depressive disorder, single episode, unspecified: Secondary | ICD-10-CM | POA: Diagnosis present

## 2018-03-07 DIAGNOSIS — Z79899 Other long term (current) drug therapy: Secondary | ICD-10-CM | POA: Insufficient documentation

## 2018-03-07 DIAGNOSIS — E1165 Type 2 diabetes mellitus with hyperglycemia: Secondary | ICD-10-CM | POA: Diagnosis present

## 2018-03-07 DIAGNOSIS — L089 Local infection of the skin and subcutaneous tissue, unspecified: Secondary | ICD-10-CM

## 2018-03-07 DIAGNOSIS — Z89432 Acquired absence of left foot: Secondary | ICD-10-CM

## 2018-03-07 DIAGNOSIS — T8131XA Disruption of external operation (surgical) wound, not elsewhere classified, initial encounter: Secondary | ICD-10-CM | POA: Insufficient documentation

## 2018-03-07 DIAGNOSIS — T8149XA Infection following a procedure, other surgical site, initial encounter: Secondary | ICD-10-CM | POA: Diagnosis present

## 2018-03-07 DIAGNOSIS — Z590 Homelessness: Secondary | ICD-10-CM

## 2018-03-07 DIAGNOSIS — E1169 Type 2 diabetes mellitus with other specified complication: Secondary | ICD-10-CM | POA: Insufficient documentation

## 2018-03-07 DIAGNOSIS — Z9119 Patient's noncompliance with other medical treatment and regimen: Secondary | ICD-10-CM

## 2018-03-07 DIAGNOSIS — F29 Unspecified psychosis not due to a substance or known physiological condition: Secondary | ICD-10-CM | POA: Insufficient documentation

## 2018-03-07 DIAGNOSIS — Z7984 Long term (current) use of oral hypoglycemic drugs: Secondary | ICD-10-CM | POA: Insufficient documentation

## 2018-03-07 DIAGNOSIS — N179 Acute kidney failure, unspecified: Secondary | ICD-10-CM | POA: Diagnosis present

## 2018-03-07 DIAGNOSIS — Z9114 Patient's other noncompliance with medication regimen: Secondary | ICD-10-CM | POA: Insufficient documentation

## 2018-03-07 DIAGNOSIS — E860 Dehydration: Secondary | ICD-10-CM | POA: Diagnosis present

## 2018-03-07 DIAGNOSIS — R739 Hyperglycemia, unspecified: Secondary | ICD-10-CM

## 2018-03-07 DIAGNOSIS — F32A Depression, unspecified: Secondary | ICD-10-CM | POA: Diagnosis present

## 2018-03-07 DIAGNOSIS — Y839 Surgical procedure, unspecified as the cause of abnormal reaction of the patient, or of later complication, without mention of misadventure at the time of the procedure: Secondary | ICD-10-CM | POA: Insufficient documentation

## 2018-03-07 DIAGNOSIS — E785 Hyperlipidemia, unspecified: Secondary | ICD-10-CM

## 2018-03-07 HISTORY — DX: Local infection of the skin and subcutaneous tissue, unspecified: L08.9

## 2018-03-07 HISTORY — DX: Other injury of unspecified body region, initial encounter: T14.8XXA

## 2018-03-07 LAB — COMPREHENSIVE METABOLIC PANEL
ALT: 13 U/L — AB (ref 17–63)
AST: 21 U/L (ref 15–41)
Albumin: 3.9 g/dL (ref 3.5–5.0)
Alkaline Phosphatase: 99 U/L (ref 38–126)
Anion gap: 10 (ref 5–15)
BILIRUBIN TOTAL: 0.6 mg/dL (ref 0.3–1.2)
BUN: 15 mg/dL (ref 6–20)
CHLORIDE: 100 mmol/L — AB (ref 101–111)
CO2: 29 mmol/L (ref 22–32)
CREATININE: 1.24 mg/dL (ref 0.61–1.24)
Calcium: 9.4 mg/dL (ref 8.9–10.3)
GFR calc Af Amer: >60 mL/min (ref >60)
GLUCOSE: 190 mg/dL — AB (ref 65–99)
Potassium: 4.1 mmol/L (ref 3.5–5.1)
Sodium: 139 mmol/L (ref 135–145)
Total Protein: 7.1 g/dL (ref 6.5–8.1)

## 2018-03-07 LAB — CBC WITH DIFFERENTIAL/PLATELET
Basophils Absolute: 0.1 10*3/uL (ref 0.0–0.1)
Basophils Relative: 1 %
EOS ABS: 0.2 10*3/uL (ref 0.0–0.7)
EOS PCT: 2 %
HCT: 44.2 % (ref 39.0–52.0)
Hemoglobin: 14.5 g/dL (ref 13.0–17.0)
LYMPHS ABS: 2.5 10*3/uL (ref 0.7–4.0)
Lymphocytes Relative: 24 %
MCH: 32.6 pg (ref 26.0–34.0)
MCHC: 32.8 g/dL (ref 30.0–36.0)
MCV: 99.3 fL (ref 78.0–100.0)
MONO ABS: 0.6 10*3/uL (ref 0.1–1.0)
Monocytes Relative: 6 %
Neutro Abs: 7.1 10*3/uL (ref 1.7–7.7)
Neutrophils Relative %: 67 %
PLATELETS: 307 10*3/uL (ref 150–400)
RBC: 4.45 MIL/uL (ref 4.22–5.81)
RDW: 14.9 % (ref 11.5–15.5)
WBC: 10.4 10*3/uL (ref 4.0–10.5)

## 2018-03-07 LAB — CBG MONITORING, ED: GLUCOSE-CAPILLARY: 140 mg/dL — AB (ref 65–99)

## 2018-03-07 LAB — I-STAT CG4 LACTIC ACID, ED
Lactic Acid, Venous: 2.55 mmol/L (ref 0.5–1.9)
Lactic Acid, Venous: 1.3 mmol/L (ref 0.5–1.9)

## 2018-03-07 LAB — POCT GLYCOSYLATED HEMOGLOBIN (HGB A1C): Hemoglobin A1C: 6.5

## 2018-03-07 LAB — SEDIMENTATION RATE: SED RATE: 13 mm/h (ref 0–16)

## 2018-03-07 LAB — C-REACTIVE PROTEIN: CRP: 0.8 mg/dL (ref <1.0)

## 2018-03-07 LAB — GLUCOSE, POCT (MANUAL RESULT ENTRY): POC Glucose: 123 mg/dl — AB (ref 70–99)

## 2018-03-07 MED ORDER — HYDROCODONE-ACETAMINOPHEN 5-325 MG PO TABS
1.0000 | ORAL_TABLET | ORAL | Status: DC | PRN
  Administered 2018-03-07 – 2018-03-11 (×10): 1 via ORAL
  Filled 2018-03-07 (×10): qty 1

## 2018-03-07 MED ORDER — ONDANSETRON HCL 4 MG/2ML IJ SOLN: 4.0000 mg | Freq: Four times a day (QID) | INTRAMUSCULAR | Status: DC | PRN

## 2018-03-07 MED ORDER — SODIUM CHLORIDE 0.9 % IV BOLUS: 2000.0000 mL | Freq: Once | INTRAVENOUS | Status: DC

## 2018-03-07 MED ORDER — SODIUM CHLORIDE 0.9 % IV SOLN
INTRAVENOUS | Status: DC
  Administered 2018-03-08 – 2018-03-09 (×2): via INTRAVENOUS

## 2018-03-07 MED ORDER — DOCUSATE SODIUM 100 MG PO CAPS: 100.0000 mg | ORAL_CAPSULE | Freq: Two times a day (BID) | ORAL | Status: DC | PRN

## 2018-03-07 MED ORDER — VANCOMYCIN HCL 10 G IV SOLR
1500.0000 mg | Freq: Once | INTRAVENOUS | Status: AC
  Administered 2018-03-07: 1500 mg via INTRAVENOUS
  Filled 2018-03-07: qty 1500

## 2018-03-07 MED ORDER — INSULIN ASPART 100 UNIT/ML ~~LOC~~ SOLN
0.0000 [IU] | Freq: Every day | SUBCUTANEOUS | Status: DC
  Filled 2018-03-07: qty 1

## 2018-03-07 MED ORDER — HYDRALAZINE HCL 20 MG/ML IJ SOLN: 5.0000 mg | INTRAMUSCULAR | Status: DC | PRN

## 2018-03-07 MED ORDER — LISINOPRIL 10 MG PO TABS: 10.0000 mg | ORAL_TABLET | Freq: Every day | ORAL | 0 refills | Status: DC

## 2018-03-07 MED ORDER — ATORVASTATIN CALCIUM 20 MG PO TABS
20.0000 mg | ORAL_TABLET | Freq: Every day | ORAL | Status: DC
  Administered 2018-03-08 – 2018-03-10 (×3): 20 mg via ORAL
  Filled 2018-03-07 (×3): qty 1

## 2018-03-07 MED ORDER — AMLODIPINE BESYLATE 5 MG PO TABS
5.0000 mg | ORAL_TABLET | Freq: Every day | ORAL | Status: DC
  Administered 2018-03-07 – 2018-03-11 (×5): 5 mg via ORAL
  Filled 2018-03-07 (×5): qty 1

## 2018-03-07 MED ORDER — METFORMIN HCL 500 MG PO TABS: 500.0000 mg | ORAL_TABLET | Freq: Two times a day (BID) | ORAL | 0 refills | Status: DC

## 2018-03-07 MED ORDER — QUETIAPINE FUMARATE 50 MG PO TABS
50.0000 mg | ORAL_TABLET | Freq: Every day | ORAL | Status: DC
  Administered 2018-03-08 – 2018-03-10 (×4): 50 mg via ORAL
  Filled 2018-03-07 (×5): qty 1

## 2018-03-07 MED ORDER — VANCOMYCIN HCL IN DEXTROSE 750-5 MG/150ML-% IV SOLN
750.0000 mg | Freq: Two times a day (BID) | INTRAVENOUS | Status: DC
  Administered 2018-03-08 – 2018-03-10 (×5): 750 mg via INTRAVENOUS
  Filled 2018-03-07 (×5): qty 150

## 2018-03-07 MED ORDER — ACETAMINOPHEN 325 MG PO TABS: 650.0000 mg | ORAL_TABLET | Freq: Four times a day (QID) | ORAL | Status: DC | PRN

## 2018-03-07 MED ORDER — ATORVASTATIN CALCIUM 20 MG PO TABS
20.0000 mg | ORAL_TABLET | Freq: Every day | ORAL | 0 refills | Status: DC
Start: 2018-03-07 — End: 2018-03-11

## 2018-03-07 MED ORDER — ZOLPIDEM TARTRATE 5 MG PO TABS
5.0000 mg | ORAL_TABLET | Freq: Every evening | ORAL | Status: DC | PRN
  Administered 2018-03-07 – 2018-03-10 (×2): 5 mg via ORAL
  Filled 2018-03-07 (×2): qty 1

## 2018-03-07 MED ORDER — INSULIN ASPART 100 UNIT/ML ~~LOC~~ SOLN
0.0000 [IU] | Freq: Three times a day (TID) | SUBCUTANEOUS | Status: DC
  Administered 2018-03-08: 2 [IU] via SUBCUTANEOUS
  Administered 2018-03-09: 1 [IU] via SUBCUTANEOUS
  Administered 2018-03-10: 2 [IU] via SUBCUTANEOUS

## 2018-03-07 MED ORDER — NICOTINE 21 MG/24HR TD PT24
21.0000 mg | MEDICATED_PATCH | Freq: Every day | TRANSDERMAL | Status: DC
  Administered 2018-03-07 – 2018-03-11 (×5): 21 mg via TRANSDERMAL
  Filled 2018-03-07 (×5): qty 1

## 2018-03-07 MED ORDER — ONDANSETRON HCL 4 MG PO TABS: 4.0000 mg | ORAL_TABLET | Freq: Four times a day (QID) | ORAL | Status: DC | PRN

## 2018-03-07 MED FILL — LISINOPRIL 10 MG TABS: 10 | 30 days supply | Qty: 30 | Fill #0

## 2018-03-07 MED FILL — ATORVASTATIN 20 MG TABLET: 20 | 30 days supply | Qty: 30 | Fill #0

## 2018-03-07 MED FILL — metFORMIN HCL 500 MG TABS: 500 | 30 days supply | Qty: 60 | Fill #0

## 2018-03-07 NOTE — ED Provider Notes (Signed)
[name] EMERGENCY DEPARTMENT Provider Note   CSN: [id number] Arrival date & time: 03/07/18  1636     History   Chief Complaint Chief Complaint  Patient presents with  . foot infection    HPI Mark Graham is a 62 y.o. male.  HPI  Patient with h/o osteomyelitis of the left foot s/p transmetatarsal amputation 12/2017, diabetes -- presents from [facility] clinic with question of infection. Patient noted to have foul odor and drainage from the wound. He states that he continues to walk on the foot. No fever, N/V/D. He has some pain that is not particularly worse. The onset of this condition was acute. The course is constant. Aggravating factors: none. Alleviating factors: none.    Past Medical History:  Diagnosis Date  . Arthritis    "probably in my hands" (11/24/2017)  . Cellulitis of left foot 11/24/2017  . High cholesterol   . Hypertension   . Osteomyelitis of left foot (HCC)    Osteomyelitis of left hallux with overlying cellulitis left foot/notes 11/24/2017  . Tobacco abuse   . Tuberculosis ~ 1990   "had tx for it"  . Type II diabetes mellitus [facility])     Patient Active Problem List   Diagnosis Date Noted  . Diabetes mellitus type 2 in nonobese (HCC) 01/14/2018  . Normochromic normocytic anemia 01/14/2018  . Osteomyelitis of left foot (HCC) 01/14/2018  . Foot osteomyelitis, left (HCC) 01/14/2018  . S/P transmetatarsal amputation of foot, left (HCC)   . Frostbite 11/24/2017  . Cellulitis 11/24/2017  . Depression 02/02/2017  . Insomnia 02/02/2017  . Protein-calorie malnutrition, severe 01/24/2017  . Urinary tract infection without hematuria   . Psychosis (HCC) 12/13/2016  . Weight loss 12/13/2016  . Abnormal ECG 12/13/2016  . Iron overload 12/13/2016  . Elevated bilirubin 12/13/2016  . PSA elevation 12/13/2016  . Diabetes mellitus (HCC) 12/12/2016  . Tobacco abuse 10/21/2015  . Hyperglycemia 10/21/2015  . Urinary frequency  10/21/2015  . RUQ abdominal pain 10/21/2015  . Hypertension 03/03/2014  . Hyponatremia 03/03/2014  . Chest pain 03/03/2014  . Leukocytosis 03/03/2014  . Urinary retention 03/03/2014    Past Surgical History:  Procedure Laterality Date  . AMPUTATION Left 11/25/2017   Procedure: TRANSMETATARSAL AMPUTATION;  Surgeon: Roby Lofts, MD;  Location: [facility] OR;  Service: Orthopedics;  Laterality: Left;  . AMPUTATION Left 01/15/2018   Procedure: REVISION TRANS METATARSAL AMPUTATION;  Surgeon: Nadara Mustard, MD;  Location: [facility] OR;  Service: Orthopedics;  Laterality: Left;  . NO PAST SURGERIES          Home Medications    Prior to Admission medications   Medication Sig Start Date End Date Taking? Authorizing Provider  acetaminophen (TYLENOL) 325 MG tablet Take 1-2 tablets (325-650 mg total) by mouth every 6 (six) hours as needed for mild pain (pain score 1-3 or temp > 100.5). Patient not taking: Reported on 03/07/2018 01/17/18   Calvert Cantor, MD  atorvastatin (LIPITOR) 20 MG tablet Take 1 tablet (20 mg total) by mouth daily at 6 PM. 03/07/18   Hoy Register, MD  bisacodyl (DULCOLAX) 10 MG suppository Place 1 suppository (10 mg total) rectally daily as needed for moderate constipation. Patient not taking: Reported on 03/07/2018 01/17/18   Calvert Cantor, MD  Blood Glucose Monitoring Suppl (TRUE METRIX METER) DEVI 1 each by Does not apply route daily. Patient not taking: Reported on 01/13/2018 11/29/17   Scherrie Gerlach, MD  docusate sodium (COLACE) 100 MG  capsule Take 1 capsule (100 mg total) by mouth 2 (two) times daily. Patient not taking: Reported on 03/07/2018 01/17/18   Calvert Cantor, MD  HYDROcodone-acetaminophen (NORCO/VICODIN) 5-325 MG tablet Take 1 tablet by mouth every 4 (four) hours as needed for moderate pain (pain score 4-6). Patient not taking: Reported on 03/07/2018 01/17/18   Calvert Cantor, MD  lisinopril (PRINIVIL,ZESTRIL) 10 MG tablet Take 1 tablet (10 mg total) by mouth daily. 03/07/18    Hoy Register, MD  metFORMIN (GLUCOPHAGE) 500 MG tablet Take 1 tablet (500 mg total) by mouth 2 (two) times daily with a meal. 03/07/18   Hoy Register, MD  polyethylene glycol (MIRALAX) packet Take 17 g by mouth daily as needed for mild constipation. Patient not taking: Reported on 03/07/2018 01/17/18   Calvert Cantor, MD  QUEtiapine (SEROQUEL) 50 MG tablet Take 1 tablet (50 mg total) by mouth at bedtime. Patient not taking: Reported on 03/07/2018 11/29/17   Scherrie Gerlach, MD    Family History Family History  Problem Relation Age of Onset  . Cancer Father   . Cancer Sister   . Diabetes Mellitus II Unknown        States 6 out of 13 of his siblings     Social History Social History   Tobacco Use  . Smoking status: Current Every Day Smoker    Packs/day: 1.00    Years: 46.00    Pack years: 46.00    Types: Cigarettes  . Smokeless tobacco: Former Neurosurgeon    Types: Chew  Substance Use Topics  . Alcohol use: Yes    Alcohol/week: 3.6 oz    Types: 6 Cans of beer per week  . Drug use: Yes    Types: Marijuana    Comment: 11/24/2017 "nothing since ~ 2002"     Allergies   Patient has no known allergies.   Review of Systems Review of Systems  Constitutional: Negative for fever.  HENT: Negative for rhinorrhea and sore throat.   Eyes: Negative for redness.  Respiratory: Negative for cough.   Cardiovascular: Negative for chest pain.  Gastrointestinal: Negative for abdominal pain, diarrhea, nausea and vomiting.  Genitourinary: Negative for dysuria.  Musculoskeletal: Negative for myalgias.  Skin: Positive for wound. Negative for color change and rash.  Neurological: Negative for headaches.     Physical Exam Updated Vital Signs BP (!) 166/102   Pulse 96   Temp 97.7 F (36.5 C)   Resp 18   SpO2 98%   Physical Exam  Constitutional: He appears well-developed and well-nourished.  HENT:  Head: Normocephalic and atraumatic.  Eyes: Conjunctivae are normal. Right eye exhibits no  discharge. Left eye exhibits no discharge.  Neck: Normal range of motion. Neck supple.  Cardiovascular: Normal rate, regular rhythm and normal heart sounds.  Pulmonary/Chest: Effort normal and breath sounds normal.  Abdominal: Soft. There is no tenderness.  Musculoskeletal:  Open poorly healing wound with stitches in place for nearly 2 months. Foul odor. There is a scant amount of yellow purulent drainage on the dressing and granulation tissue within wound. No significant redness of foot itself.   Neurological: He is alert.  Skin: Skin is warm and dry.  Psychiatric: He has a normal mood and affect.  Nursing note and vitals reviewed.         ED Treatments / Results  Labs (all labs ordered are listed, but only abnormal results are displayed) Labs Reviewed  COMPREHENSIVE METABOLIC PANEL - Abnormal; Notable for the following components:  Result Value   Chloride 100 (*)    Glucose, Bld 190 (*)    ALT 13 (*)    All other components within normal limits  I-STAT CG4 LACTIC ACID, ED - Abnormal; Notable for the following components:   Lactic Acid, Venous 2.55 (*)    All other components within normal limits  CULTURE, BLOOD (ROUTINE X 2)  CULTURE, BLOOD (ROUTINE X 2)  AEROBIC CULTURE (SUPERFICIAL SPECIMEN)  AEROBIC CULTURE (SUPERFICIAL SPECIMEN)  CBC WITH DIFFERENTIAL/PLATELET  SEDIMENTATION RATE  C-REACTIVE PROTEIN  I-STAT CG4 LACTIC ACID, ED    EKG None  Radiology Dg Foot Complete Left  Result Date: 03/07/2018 CLINICAL DATA:  Foot infection. EXAM: LEFT FOOT - COMPLETE 3+ VIEW COMPARISON:  Radiographs of January 13, 2018. FINDINGS: Status post surgical amputation at the level of the proximal metatarsals. No lytic destruction is seen at this time to suggest acute osteomyelitis. No radiopaque foreign body is noted. Dorsal soft tissue swelling is noted which may be postoperative in etiology. IMPRESSION: Postsurgical changes as described above. No definite lytic destruction is  seen to suggest osteomyelitis. Electronically Signed   By: Lupita Raider, M.D.   On: 03/07/2018 19:24    Procedures Procedures (including critical care time)  Medications Ordered in ED Medications - No data to display   Initial Impression / Assessment and Plan / ED Course  I have reviewed the triage vital signs and the nursing notes.  Pertinent labs & imaging results that were available during my care of the patient were reviewed by me and considered in my medical decision making (see chart for details).  Clinical Course as of Mar 08 2111  Mon Mar 07, 2018  5960 62 year old male with amputation of toes of his left foot here sent in from clinic for concerns for foot infection.  Patient is homeless is up on his feet a lot does not seem to be taking very good care of it.  He still has a sutures in from the surgery that happened 2 months ago.  He states it has been painful and has been draining some fluid.  There is not a lot of overt signs of cellulitis on top of the foot is lab works pretty unremarkable.  His lactate was elevated but is cleared.  Due to his social situation he may benefit from being in house and will get some further work-up on this.   [MB]    Clinical Course User Index [MB] Terrilee Files, MD     Patient seen and examined. Work-up initiated.    Vital signs reviewed and are as follows: BP (!) 166/102   Pulse 96   Temp 97.7 F (36.5 C)   Resp 18   SpO2 98%   Discussed with Dr. Charm Barges. Reviewed records from visit today and multiple notes from social work attempting unsuccessfully to ensure patient followed up for this.   Given worsening drainage, dehisance, odor, and patient's social situation will speak with ortho and consider admission.   9:12 PM Spoke with Dr. Magnus Ivan with [place]. States someone will see inpatient tomorrow. Will request unassigned medicine admit.  9:26 PM Spoke with Dr. Clyde Lundborg who will admit.    Final Clinical Impressions(s) / ED  Diagnoses   Final diagnoses:  Wound infection after surgery  Hyperglycemia   Admit for wound infection.   ED Discharge Orders    None       Renne Crigler, Cordelia Poche 03/07/18 2129    Terrilee Files, MD 03/08/18 1140

## 2018-03-07 NOTE — ED Provider Notes (Signed)
MSE was initiated and I personally evaluated the patient and placed orders (if any) at  6:52 PM on Mar 07, 2018.  The patient appears stable so that the remainder of the MSE may be completed by another provider.   Patient placed in Quick Look pathway, seen and evaluated   Chief Complaint: Foot wound HPI:   With history of type 2 diabetes hypertension homelessness who was seen by the  in community wellness center earlier.  Had a left transmetatarsal amputation due to frostbite on 11/14/2017 with a revision surgery in 3 of 2019.  He has not followed up with orthopedics.  He was seen at that wellness center earlier and sent in for evaluation of wound dehiscence, and concern for need for debridement.  ROS: Foot wound (one)  Physical Exam:   Gen: No distress  Neuro: Awake and Alert  Skin: Warm    Focused Exam: Patient's left foot with dehiscence, gangrenous odor with macerated white necrotic tissue in the open wound.  Wound culture sent and I have ordered basic lab evaluation and imaging.   Initiation of care has begun. The patient has been counseled on the process, plan, and necessity for staying for the completion/evaluation, and the remainder of the medical screening examination    Delos Haring 03/07/18 Vertell Limber, MD 03/07/18 2352

## 2018-03-07 NOTE — Progress Notes (Signed)
Subjective:  Patient ID: Mark Graham, male    DOB: 1956/04/03  Age: 62 y.o. MRN: [id number]  CC: Diabetes   HPI Mark Graham is a 62 year old male with a history of type 2 diabetes mellitus (A1c 6.5), hypertension, homelessness, depression with underlying psychotic disorder, status post left transmetatarsal amputation secondary to frostbite in 10/2017 with subsequent revision in 12/2017 here for follow-up visit. He was discharged to a rehab facility after hospitalization. Since his procedure, he has not been to see orthopedics for follow-up but informs me he has been soaking his left foot and wearing his cam walker while ambulating with a walker.  His sutures have not been taken out either. Denies fever, lightheadedness, chest pain or shortness of breath He does not have any of his medications and his blood pressure is elevated today. He has not had his metformin either. He was seen with the case manager in the clinic and she verified that several attempts at reaching him to schedule an appointment here in the clinic were futile; we have spoken to the pharmacy on site to assist with his medications.  Past Medical History:  Diagnosis Date  . Arthritis    "probably in my hands" (11/24/2017)  . Cellulitis of left foot 11/24/2017  . High cholesterol   . Hypertension   . Osteomyelitis of left foot (HCC)    Osteomyelitis of left hallux with overlying cellulitis left foot/notes 11/24/2017  . Tobacco abuse   . Tuberculosis ~ 1990   "had tx for it"  . Type II diabetes mellitus (HCC)     Past Surgical History:  Procedure Laterality Date  . AMPUTATION Left 11/25/2017   Procedure: TRANSMETATARSAL AMPUTATION;  Surgeon: Roby Lofts, MD;  Location: [facility] OR;  Service: Orthopedics;  Laterality: Left;  . AMPUTATION Left 01/15/2018   Procedure: REVISION TRANS METATARSAL AMPUTATION;  Surgeon: Nadara Mustard, MD;  Location: [facility] OR;  Service: Orthopedics;  Laterality: Left;  . NO PAST SURGERIES      No  Known Allergies   Outpatient Medications Prior to Visit  Medication Sig Dispense Refill  . acetaminophen (TYLENOL) 325 MG tablet Take 1-2 tablets (325-650 mg total) by mouth every 6 (six) hours as needed for mild pain (pain score 1-3 or temp > 100.5). (Patient not taking: Reported on 03/07/2018)    . bisacodyl (DULCOLAX) 10 MG suppository Place 1 suppository (10 mg total) rectally daily as needed for moderate constipation. (Patient not taking: Reported on 03/07/2018) 12 suppository 0  . Blood Glucose Monitoring Suppl (TRUE METRIX METER) DEVI 1 each by Does not apply route daily. (Patient not taking: Reported on 01/13/2018) 1 Device 0  . docusate sodium (COLACE) 100 MG capsule Take 1 capsule (100 mg total) by mouth 2 (two) times daily. (Patient not taking: Reported on 03/07/2018) 10 capsule 0  . HYDROcodone-acetaminophen (NORCO/VICODIN) 5-325 MG tablet Take 1 tablet by mouth every 4 (four) hours as needed for moderate pain (pain score 4-6). (Patient not taking: Reported on 03/07/2018) 20 tablet 0  . polyethylene glycol (MIRALAX) packet Take 17 g by mouth daily as needed for mild constipation. (Patient not taking: Reported on 03/07/2018) 14 each 0  . QUEtiapine (SEROQUEL) 50 MG tablet Take 1 tablet (50 mg total) by mouth at bedtime. (Patient not taking: Reported on 03/07/2018) 30 tablet 0  . atorvastatin (LIPITOR) 20 MG tablet Take 1 tablet (20 mg total) by mouth daily at 6 PM. (Patient not taking: Reported on 03/07/2018) 30 tablet 0  . lisinopril (  PRINIVIL,ZESTRIL) 10 MG tablet Take 1 tablet (10 mg total) by mouth daily. (Patient not taking: Reported on 03/07/2018) 30 tablet 0  . metFORMIN (GLUCOPHAGE) 500 MG tablet Take 1 tablet (500 mg total) by mouth 2 (two) times daily with a meal. (Patient not taking: Reported on 03/07/2018) 60 tablet 0   No facility-administered medications prior to visit.     ROS Review of Systems  Constitutional: Negative for activity change and appetite change.  HENT: Negative  for sinus pressure and sore throat.   Eyes: Negative for visual disturbance.  Respiratory: Negative for cough, chest tightness and shortness of breath.   Cardiovascular: Negative for chest pain and leg swelling.  Gastrointestinal: Negative for abdominal distention, abdominal pain, constipation and diarrhea.  Endocrine: Negative.   Genitourinary: Negative for dysuria.  Musculoskeletal: Negative for joint swelling and myalgias.  Skin: Positive for wound. Negative for rash.  Allergic/Immunologic: Negative.   Neurological: Negative for weakness, light-headedness and numbness.  Psychiatric/Behavioral: Negative for dysphoric mood and suicidal ideas.    Objective:  BP (!) 157/101   Pulse (!) 104   Temp (!) 97.5 F (36.4 C) (Oral)   Ht  (1.803 m)   Wt 154 lb 12.8 oz (70.2 kg)   SpO2 94%   BMI 21.59 kg/m   BP/Weight 03/07/2018 01/17/2018 01/13/2018  Systolic BP 157 122 -  Diastolic BP 101 70 -  Wt. (Lbs) 154.8 - 144  BMI 21.59 - 20.08      Physical Exam  Constitutional: He is oriented to person, place, and time. He appears well-developed and well-nourished.  Cardiovascular: Normal heart sounds and intact distal pulses. Tachycardia present.  No murmur heard. Pulmonary/Chest: Effort normal and breath sounds normal. He has no wheezes. He has no rales. He exhibits no tenderness.  Abdominal: Soft. Bowel sounds are normal. He exhibits no distension and no mass. There is no tenderness.  Musculoskeletal:  Left transmetatarsal amputation, sutures in place with dehiscence of wound, foul-smelling discharge. Left posterior tibialis pulse palpable Right foot is normal  Neurological: He is alert and oriented to person, place, and time.  Skin: Skin is warm and dry.  Psychiatric: He has a normal mood and affect.    CMP Latest Ref Rng & Units 01/17/2018 01/14/2018 01/13/2018  Glucose 65 - 99 mg/dL 409(W) 119(J) 478(G)  BUN 6 - 20 mg/dL Creatinine 0.61 - 1.24 mg/dL 9.56 2.13 0.86    Sodium 135 - 145 mmol/L 137 137 135  Potassium 3.5 - 5.1 mmol/L 3.8 3.4(L) 3.6  Chloride 101 - 111 mmol/L 104 103 103  CO2 22 - 32 mmol/L Calcium 8.9 - 10.3 mg/dL 5.7(Q) 4.6(N) 6.2(X)  Total Protein 6.5 - 8.1 g/dL - - 6.5  Total Bilirubin 0.3 - 1.2 mg/dL - - 0.4  Alkaline Phos 38 - 126 U/L - - 89  AST 15 - 41 U/L - - 20  ALT 17 - 63 U/L - - 13(L)    Lab Results  Component Value Date   HGBA1C 6.5 03/07/2018     Assessment & Plan:   1. Type 2 diabetes mellitus with other specified complication, without long-term current use of insulin (HCC) Controlled with A1c of 6.5 - POCT glycosylated hemoglobin (Hb A1C) - POCT glucose (manual entry) - atorvastatin (LIPITOR) 20 MG tablet; Take 1 tablet (20 mg total) by mouth daily at 6 PM.  Dispense: 30 tablet; Refill: 0 - metFORMIN (GLUCOPHAGE) 500 MG tablet; Take 1 tablet (500 mg total)  by mouth 2 (two) times daily with a meal.  Dispense: 60 tablet; Refill: 0  2. S/P transmetatarsal amputation of foot, left (HCC) Secondary to frostbite Never followed up with orthopedics postoperatively  3. Non-compliance Emphasized the need to be compliant with medications; discussed the implications of noncompliance and complications of uncontrolled medical conditions Homelessness is a major contributing factor as well as financial constraints coupled wit poor judgment  4. Wound dehiscence Refer to the ED as he will need wound debridement vs revision  5. Essential hypertension Uncontrolled due to being out of medications which I have refilled Low sodium, DASH diet - lisinopril (PRINIVIL,ZESTRIL) 10 MG tablet; Take 1 tablet (10 mg total) by mouth daily.  Dispense: 30 tablet; Refill: 0   Meds ordered this encounter  Medications  . atorvastatin (LIPITOR) 20 MG tablet    Sig: Take 1 tablet (20 mg total) by mouth daily at 6 PM.    Dispense:  30 tablet    Refill:  0  . lisinopril (PRINIVIL,ZESTRIL) 10 MG tablet    Sig: Take 1 tablet (10 mg  total) by mouth daily.    Dispense:  30 tablet    Refill:  0  . metFORMIN (GLUCOPHAGE) 500 MG tablet    Sig: Take 1 tablet (500 mg total) by mouth 2 (two) times daily with a meal.    Dispense:  60 tablet    Refill:  0    Follow-up: Return in about 1 month (around 04/07/2018) for Follow-up of chronic medical conditions.   Hoy Register MD

## 2018-03-07 NOTE — Patient Instructions (Signed)
Diabetes Mellitus and Nutrition When you have diabetes (diabetes mellitus), it is very important to have healthy eating habits because your blood sugar (glucose) levels are greatly affected by what you eat and drink. Eating healthy foods in the appropriate amounts, at about the same times every day, can help you:  Control your blood glucose.  Lower your risk of heart disease.  Improve your blood pressure.  Reach or maintain a healthy weight.  Every person with diabetes is different, and each person has different needs for a meal plan. Your health care provider may recommend that you work with a diet and nutrition specialist (dietitian) to make a meal plan that is best for you. Your meal plan may vary depending on factors such as:  The calories you need.  The medicines you take.  Your weight.  Your blood glucose, blood pressure, and cholesterol levels.  Your activity level.  Other health conditions you have, such as heart or kidney disease.  How do carbohydrates affect me? Carbohydrates affect your blood glucose level more than any other type of food. Eating carbohydrates naturally increases the amount of glucose in your blood. Carbohydrate counting is a method for keeping track of how many carbohydrates you eat. Counting carbohydrates is important to keep your blood glucose at a healthy level, especially if you use insulin or take certain oral diabetes medicines. It is important to know how many carbohydrates you can safely have in each meal. This is different for every person. Your dietitian can help you calculate how many carbohydrates you should have at each meal and for snack. Foods that contain carbohydrates include:  Bread, cereal, rice, pasta, and crackers.  Potatoes and corn.  Peas, beans, and lentils.  Milk and yogurt.  Fruit and juice.  Desserts, such as cakes, cookies, ice cream, and candy.  How does alcohol affect me? Alcohol can cause a sudden decrease in blood  glucose (hypoglycemia), especially if you use insulin or take certain oral diabetes medicines. Hypoglycemia can be a life-threatening condition. Symptoms of hypoglycemia (sleepiness, dizziness, and confusion) are similar to symptoms of having too much alcohol. If your health care provider says that alcohol is safe for you, follow these guidelines:  Limit alcohol intake to no more than 1 drink per day for nonpregnant women and 2 drinks per day for men. One drink equals 12 oz of beer, 5 oz of wine, or 1 oz of hard liquor.  Do not drink on an empty stomach.  Keep yourself hydrated with water, diet soda, or unsweetened iced tea.  Keep in mind that regular soda, juice, and other mixers may contain a lot of sugar and must be counted as carbohydrates.  What are tips for following this plan? Reading food labels  Start by checking the serving size on the label. The amount of calories, carbohydrates, fats, and other nutrients listed on the label are based on one serving of the food. Many foods contain more than one serving per package.  Check the total grams (g) of carbohydrates in one serving. You can calculate the number of servings of carbohydrates in one serving by dividing the total carbohydrates by 15. For example, if a food has 30 g of total carbohydrates, it would be equal to 2 servings of carbohydrates.  Check the number of grams (g) of saturated and trans fats in one serving. Choose foods that have low or no amount of these fats.  Check the number of milligrams (mg) of sodium in one serving. Most people   should limit total sodium intake to less than 2,300 mg per day.  Always check the nutrition information of foods labeled as "low-fat" or "nonfat". These foods may be higher in added sugar or refined carbohydrates and should be avoided.  Talk to your dietitian to identify your daily goals for nutrients listed on the label. Shopping  Avoid buying canned, premade, or processed foods. These  foods tend to be high in fat, sodium, and added sugar.  Shop around the outside edge of the grocery store. This includes fresh fruits and vegetables, bulk grains, fresh meats, and fresh dairy. Cooking  Use low-heat cooking methods, such as baking, instead of high-heat cooking methods like deep frying.  Cook using healthy oils, such as olive, canola, or sunflower oil.  Avoid cooking with butter, cream, or high-fat meats. Meal planning  Eat meals and snacks regularly, preferably at the same times every day. Avoid going long periods of time without eating.  Eat foods high in fiber, such as fresh fruits, vegetables, beans, and whole grains. Talk to your dietitian about how many servings of carbohydrates you can eat at each meal.  Eat 4-6 ounces of lean protein each day, such as lean meat, chicken, fish, eggs, or tofu. 1 ounce is equal to 1 ounce of meat, chicken, or fish, 1 egg, or 1/4 cup of tofu.  Eat some foods each day that contain healthy fats, such as avocado, nuts, seeds, and fish. Lifestyle   Check your blood glucose regularly.  Exercise at least 30 minutes 5 or more days each week, or as told by your health care provider.  Take medicines as told by your health care provider.  Do not use any products that contain nicotine or tobacco, such as cigarettes and e-cigarettes. If you need help quitting, ask your health care provider.  Work with a counselor or diabetes educator to identify strategies to manage stress and any emotional and social challenges. What are some questions to ask my health care provider?  Do I need to meet with a diabetes educator?  Do I need to meet with a dietitian?  What number can I call if I have questions?  When are the best times to check my blood glucose? Where to find more information:  American Diabetes Association: diabetes.org/food-and-fitness/food  Academy of Nutrition and Dietetics:  www.eatright.org/resources/health/diseases-and-conditions/diabetes  National Institute of Diabetes and Digestive and Kidney Diseases (NIH): www.niddk.nih.gov/health-information/diabetes/overview/diet-eating-physical-activity Summary  A healthy meal plan will help you control your blood glucose and maintain a healthy lifestyle.  Working with a diet and nutrition specialist (dietitian) can help you make a meal plan that is best for you.  Keep in mind that carbohydrates and alcohol have immediate effects on your blood glucose levels. It is important to count carbohydrates and to use alcohol carefully. This information is not intended to replace advice given to you by your health care provider. Make sure you discuss any questions you have with your health care provider. Document Released: 07/09/2005 Document Revised: 11/16/2016 Document Reviewed: 11/16/2016 Elsevier Interactive Patient Education  2018 Elsevier Inc.  

## 2018-03-07 NOTE — H&P (Signed)
History and Physical    Mark Graham:[id number] DOB: [birth year] DOA: 03/07/2018  Referring MD/NP/PA:   PCP: [name], MD   Patient coming from:  The patient is coming from home.  At baseline, pt is independent for most of ADL.   Chief Complaint: left foot pain  HPI: Mark Graham is a 62 y.o. male with medical history significant of hypertension, hyperlipidemia, diabetes mellitus, depression, tobacco abuse, left foot osteomyelitis, wh presents with left foot pain.  Pt underwent transmetatarsal amputation 12/2017. He was supposed to f/u with ortho, but did not do so. He states that has he has worsening left foot pain in the past several days, which has been progressively getting worse. Patient noted foul odor and drainage from the wound.  The pain is constant, moderate, sharp, nonradiating.  Patient denies fever or chills. [facility], and sent to ED due to concerning of wound infection. Patient does not have chest pain, shortness of breath, cough.  No nausea, vomiting, diarrhea, abdominal pain, symptoms of UTI or unilateral weakness.  ED Course: pt was found to have WBC 10.4, lactic acid 2.55, 1.30, acute renal injury with creatinine 1.24, BUN 15, temperature 97.5, tachycardia, no tachypnea, oxygen saturation 98% on room air.  X-ray of left foot did not show evidence of osteomyelitis.  Patient is admitted to [facility] bed as inpatient.  Orthopedic surgeon, Dr. Ninfa Linden was consulted by EDP.  Review of Systems:   General: no fevers, chills, no body weight gain, has fatigue HEENT: no blurry vision, hearing changes or sore throat Respiratory: no dyspnea, coughing, wheezing CV: no chest pain, no palpitations GI: no nausea, vomiting, abdominal pain, diarrhea, constipation GU: no dysuria, burning on urination, increased urinary frequency, hematuria  Ext: no leg edema Neuro: no unilateral weakness, numbness, or tingling, no vision change  or hearing loss Skin: has open wound in left foot with pus drainage. MSK: No muscle spasm, no deformity, no limitation of range of movement in spin Heme: No easy bruising.  Travel history: No recent long distant travel.  Allergy: No Known Allergies  Past Medical History:  Diagnosis Date  . Arthritis    "probably in my hands" (11/24/2017)  . Cellulitis of left foot 11/24/2017  . High cholesterol   . Hypertension   . Osteomyelitis of left foot (HCC)    Osteomyelitis of left hallux with overlying cellulitis left foot/notes 11/24/2017  . Tobacco abuse   . Tuberculosis ~ 1990   "had tx for it"  . Type II diabetes mellitus ([place])     Past Surgical History:  Procedure Laterality Date  . AMPUTATION Left 11/25/2017   Procedure: TRANSMETATARSAL AMPUTATION;  Surgeon: Shona Needles, MD;  Location: [facility];  Service: Orthopedics;  Laterality: Left;  . AMPUTATION Left 01/15/2018   Procedure: REVISION TRANS METATARSAL AMPUTATION;  Surgeon: Newt Minion, MD;  Location: [facility];  Service: Orthopedics;  Laterality: Left;  . NO PAST SURGERIES      Social History:  reports that he has been smoking cigarettes.  He has a 46.00 pack-year smoking history. He has quit using smokeless tobacco. His smokeless tobacco use included chew. He reports that he drinks about 3.6 oz of alcohol per week. He reports that he has current or past drug history. Drug: Marijuana.  Family History:  Family History  Problem Relation Age of Onset  . Cancer Father   . Cancer Sister   . Diabetes Mellitus II Unknown  States 6 out of 13 of his siblings      Prior to Admission medications   Medication Sig Start Date End Date Taking? Authorizing Provider  acetaminophen (TYLENOL) 325 MG tablet Take 1-2 tablets (325-650 mg total) by mouth every 6 (six) hours as needed for mild pain (pain score 1-3 or temp > 100.5). Patient not taking: Reported on 03/07/2018 01/17/18   Debbe Odea, MD  atorvastatin (LIPITOR) 20 MG tablet Take  1 tablet (20 mg total) by mouth daily at 6 PM. 03/07/18   [name], MD  bisacodyl (DULCOLAX) 10 MG suppository Place 1 suppository (10 mg total) rectally daily as needed for moderate constipation. Patient not taking: Reported on 03/07/2018 01/17/18   Debbe Odea, MD  Blood Glucose Monitoring Suppl (TRUE METRIX METER) DEVI 1 each by Does not apply route daily. Patient not taking: Reported on 01/13/2018 11/29/17   Colbert Ewing, MD  docusate sodium (COLACE) 100 MG capsule Take 1 capsule (100 mg total) by mouth 2 (two) times daily. Patient not taking: Reported on 03/07/2018 01/17/18   Debbe Odea, MD  HYDROcodone-acetaminophen (NORCO/VICODIN) 5-325 MG tablet Take 1 tablet by mouth every 4 (four) hours as needed for moderate pain (pain score 4-6). Patient not taking: Reported on 03/07/2018 01/17/18   Debbe Odea, MD  lisinopril (PRINIVIL,ZESTRIL) 10 MG tablet Take 1 tablet (10 mg total) by mouth daily. 03/07/18   [name], MD  metFORMIN (GLUCOPHAGE) 500 MG tablet Take 1 tablet (500 mg total) by mouth 2 (two) times daily with a meal. 03/07/18   [name], MD  polyethylene glycol (MIRALAX) packet Take 17 g by mouth daily as needed for mild constipation. Patient not taking: Reported on 03/07/2018 01/17/18   Debbe Odea, MD  QUEtiapine (SEROQUEL) 50 MG tablet Take 1 tablet (50 mg total) by mouth at bedtime. Patient not taking: Reported on 03/07/2018 11/29/17   Colbert Ewing, MD    Physical Exam: Vitals:   03/07/18 2128 03/07/18 2211 03/07/18 2300 03/08/18 0036  BP: (!) 150/105 (!) 150/105 (!) 175/113 (!) 170/96  Pulse: 79 79 81 76  Resp: _0 Temp: 97.6 F (36.4 C) 97.6 F (36.4 C)  97.9 F (36.6 C)  TempSrc: Oral Oral  Oral  SpO2: 97% 99% 96% 98%   General: Not in acute distress HEENT:       Eyes: PERRL, EOMI, no scleral icterus.       ENT: No discharge from the ears and nose, no pharynx injection, no tonsillar enlargement.        Neck: No JVD, no bruit, no mass  felt. Heme: No neck lymph node enlargement. Cardiac: S1/S2, RRR, No murmurs, No gallops or rubs. Respiratory: No rales, wheezing, rhonchi or rubs. GI: Soft, nondistended, nontender, no rebound pain, no organomegaly, BS present. GU: No hematuria Ext: No pitting leg edema bilaterally. 2+DP/PT pulse bilaterally. Musculoskeletal: No joint deformities, No joint redness or warmth, no limitation of ROM in spin. Skin: has an open wound from previous surgical site, with stitches still in place. Has a little yellow purulent drainage from wound  Neuro: Alert, oriented X3, cranial nerves II-XII grossly intact, moves all extremities normally.  Psych: Patient is not psychotic, no suicidal or hemocidal ideation.  Labs on Admission: I have personally reviewed following labs and imaging studies  CBC: Recent Labs  Lab 03/07/18 1841 03/08/18 0101  WBC 10.4 6.5  NEUTROABS 7.1  --   HGB 14.5 13.1  HCT 44.2 40.3  MCV 99.3 98.5  PLT  307 409   Basic Metabolic Panel: Recent Labs  Lab 03/07/18 1841 03/08/18 0101  NA 139 141  K 4.1 3.8  CL 100* 105  CO2 29 28  GLUCOSE 190* 170*  BUN 15 13  CREATININE 1.24 1.12  CALCIUM 9.4 8.9   GFR: Estimated Creatinine Clearance: 68.8 mL/min (by C-G formula based on SCr of 1.12 mg/dL). Liver Function Tests: Recent Labs  Lab 03/07/18 1841  AST 21  ALT 13*  ALKPHOS 99  BILITOT 0.6  PROT 7.1  ALBUMIN 3.9   No results for input(s): LIPASE, AMYLASE in the last 168 hours. No results for input(s): AMMONIA in the last 168 hours. Coagulation Profile: Recent Labs  Lab 03/08/18 0101  INR 0.99   Cardiac Enzymes: No results for input(s): CKTOTAL, CKMB, CKMBINDEX, TROPONINI in the last 168 hours. BNP (last 3 results) No results for input(s): PROBNP in the last 8760 hours. HbA1C: Recent Labs    03/07/18 1434  HGBA1C 6.5   CBG: Recent Labs  Lab 03/07/18 2256  GLUCAP 140*   Lipid Profile: No results for input(s): CHOL, HDL, LDLCALC, TRIG, CHOLHDL,  LDLDIRECT in the last 72 hours. Thyroid Function Tests: No results for input(s): TSH, T4TOTAL, FREET4, T3FREE, THYROIDAB in the last 72 hours. Anemia Panel: No results for input(s): VITAMINB12, FOLATE, FERRITIN, TIBC, IRON, RETICCTPCT in the last 72 hours. Urine analysis:    Component Value Date/Time   COLORURINE YELLOW 01/13/2018 0041   APPEARANCEUR HAZY (A) 01/13/2018 0041   LABSPEC 1.005 01/13/2018 0041   PHURINE 6.0 01/13/2018 0041   GLUCOSEU NEGATIVE 01/13/2018 0041   HGBUR SMALL (A) 01/13/2018 0041   BILIRUBINUR NEGATIVE 01/13/2018 0041   KETONESUR NEGATIVE 01/13/2018 0041   PROTEINUR NEGATIVE 01/13/2018 0041   UROBILINOGEN 1.0 01/15/2015 1946   NITRITE NEGATIVE 01/13/2018 0041   LEUKOCYTESUR LARGE (A) 01/13/2018 0041   Sepsis Labs: _0 (procalcitonin:4,lacticidven:4) )No results found for this or any previous visit (from the past 240 hour(s)).   Radiological Exams on Admission: Dg Foot Complete Left  Result Date: 03/07/2018 CLINICAL DATA:  Foot infection. EXAM: LEFT FOOT - COMPLETE 3+ VIEW COMPARISON:  Radiographs of January 13, 2018. FINDINGS: Status post surgical amputation at the level of the proximal metatarsals. No lytic destruction is seen at this time to suggest acute osteomyelitis. No radiopaque foreign body is noted. Dorsal soft tissue swelling is noted which may be postoperative in etiology. IMPRESSION: Postsurgical changes as described above. No definite lytic destruction is seen to suggest osteomyelitis. Electronically Signed   By: Marijo Conception, M.D.   On: 03/07/2018 19:24     EKG: Not done in ED, will get one.   Assessment/Plan Principal Problem:   Wound infection after surgery Active Problems:   Hypertension   Tobacco abuse   Diabetes mellitus type 2, controlled, with complications ([place])   Depression   S/P transmetatarsal amputation of foot, left (HCC)   HLD (hyperlipidemia)   Left foot infection   AKI (acute kidney injury)  ([place])    Diabetic left foot wound infection after surgery: pt is S/P transmetatarsal amputation of L foot, now has wound infection. Lactic acid is elevated, but clinically not septic. Patient does not have leukocytosis or fever.  Currently hemodynamically stable.  Orthopedic surgeon, Dr. Ninfa Linden was consulted by EDP.  - will admit to tele bed as inpt - Empiric antimicrobial treatment with vancomycin - PRN Zofran for nausea, Norco for pain - Blood cultures x 2  - ESR and CRP - wound care consult - MRI-L  foot - ABI - will get Procalcitonin and trend lactic acid level - IVF: 2.0 L of NS bolus in ED, followed by 100 cc/h  HTN: Blood pressure 166/102 -Hold her lisinopril due to acute renal injury -Start amlodipine 5 mg daily -IV hydralazine prn  Tobacco abuse: -Did counseling about importance of quitting smoking -Nicotine patch  Diabetes mellitus type 2, controlled, with complications ([place]): Last A1c 6.5 on 03/07/18, well controled. Patient is taking metfomin at home -SSI  Depression : Stable, no suicidal or homicidal ideations. -Continue home medications: Seroquel  HLD:  -Continue lipitor  AKI: Creatinine 1.25, BUN 15. likely due to prerenal secondary to dehydration and continuation of ACEI, diuretics - IVF as above - Follow up renal function by BMP - Hold Prinzide  DVT ppx: SCD Code Status: Full code Family Communication: None at bed side.   Disposition Plan:  Anticipate discharge back to previous home environment Consults called:  Ortho, Dr. Ninfa Linden Admission status: medical floor/inpt  Date of Service 03/08/2018    Ivor Costa [facility] Pager [phone]  If 7PM-7AM, please contact night-coverage [email or web address] Password [facility] 03/08/2018, 5:27 AM

## 2018-03-07 NOTE — ED Triage Notes (Signed)
Pt states some toes removed January and then had more debridement in March. Had a follow up [facility] and was told it looked infected. Pt is diabetic.

## 2018-03-07 NOTE — Progress Notes (Signed)
Pharmacy Antibiotic Note  Mark Graham is a 62 y.o. male admitted on 03/07/2018 with cellulitis.  S/p L transmetatarsal amputation 11/14/17 with revision surgery in 12/2017. Imaging not suggestive of osteo, foot was "suspicious for possibility of osteo" in March. Pharmacy has been consulted for vancomycin dosing. SCr 1.24 on admit, CrCl~60-65.  Plan: Vancomycin  IV x 1; then  IV q12h Monitor clinical progress, c/s, renal function F/u de-escalation plan/LOT, vancomycin trough as indicated      Temp (24hrs), Avg:97.6 F (36.4 C), Min:97.5 F (36.4 C), Max:97.7 F (36.5 C)  Recent Labs  Lab 03/07/18 1841 03/07/18 1916 03/07/18 2101  WBC 10.4  --   --   CREATININE 1.24  --   --   LATICACIDVEN  --  2.55* 1.30    Estimated Creatinine Clearance: 62.1 mL/min (by C-G formula based on SCr of 1.24 mg/dL).    No Known Allergies  Babs Bertin, PharmD, BCPS Clinical Pharmacist 03/07/2018 9:23 PM

## 2018-03-07 NOTE — Telephone Encounter (Signed)
Met with the patient when he was in the clinic for his appointment today. Dr Newlin had instructed him to go to the ED now. He stated multiple times that he would prefer to go tomorrow. This CM strongly urged him to go now as instructed.   His medications that Dr Newlin refilled were not ready for pick up at [facility].  He is only eligible for 1 order of medications to be charged to his account as he has no insurance and no income. He will need to apply for the Blue Card in order to charge any additional medication after this order.  He scheduled an appointment with [facility] financial counselor for 03/23/18.  At this time, he will hold off picking up the refills until he is seen in the ED as he may be admitted or prescribed additional medication that will need to be charged to an account .  Dr Newlin is aware that he was asking if he could wait and go to the ED tomorrow and he will hold off picking up the medications at [facility] pharmacy until after being seen in the ED  This CM provided him with bus passes to return to [facility] for medication pick up and financial counseling appointments and again encouraged him to go to the ED now   

## 2018-03-07 NOTE — Care Management (Signed)
Patient is followed by Dr. Alvis Lemmings at the [facility] patient was seen today and was sent today as per [facility] Case Manager  for wound evaluation r/o dehiscence. Patient will continue to follow at the clinic once cleared for discharge.  [facility] CM will follow for transitional care planning.

## 2018-03-07 NOTE — ED Notes (Signed)
Pt needs new wound culture; initial wound culture collected with wrong swab.

## 2018-03-08 ENCOUNTER — Other Ambulatory Visit: Payer: Self-pay

## 2018-03-08 ENCOUNTER — Encounter (HOSPITAL_COMMUNITY): Payer: Self-pay | Admitting: General Practice

## 2018-03-08 DIAGNOSIS — T8149XA Infection following a procedure, other surgical site, initial encounter: Secondary | ICD-10-CM

## 2018-03-08 DIAGNOSIS — N179 Acute kidney failure, unspecified: Secondary | ICD-10-CM | POA: Diagnosis present

## 2018-03-08 LAB — CBC
HCT: 40.3 % (ref 39.0–52.0)
HEMOGLOBIN: 13.1 g/dL (ref 13.0–17.0)
MCH: 32 pg (ref 26.0–34.0)
MCHC: 32.5 g/dL (ref 30.0–36.0)
MCV: 98.5 fL (ref 78.0–100.0)
Platelets: 264 10*3/uL (ref 150–400)
RBC: 4.09 MIL/uL — AB (ref 4.22–5.81)
RDW: 14.8 % (ref 11.5–15.5)
WBC: 6.5 10*3/uL (ref 4.0–10.5)

## 2018-03-08 LAB — BASIC METABOLIC PANEL
ANION GAP: 8 (ref 5–15)
BUN: 13 mg/dL (ref 6–20)
CALCIUM: 8.9 mg/dL (ref 8.9–10.3)
CO2: 28 mmol/L (ref 22–32)
Chloride: 105 mmol/L (ref 101–111)
Creatinine, Ser: 1.12 mg/dL (ref 0.61–1.24)
GFR calc Af Amer: >60 mL/min (ref >60)
Glucose, Bld: 170 mg/dL — ABNORMAL HIGH (ref 65–99)
Potassium: 3.8 mmol/L (ref 3.5–5.1)
Sodium: 141 mmol/L (ref 135–145)

## 2018-03-08 LAB — PROTIME-INR
INR: 0.99
PROTHROMBIN TIME: 13 s (ref 11.4–15.2)

## 2018-03-08 LAB — GLUCOSE, CAPILLARY
GLUCOSE-CAPILLARY: 179 mg/dL — AB (ref 65–99)
GLUCOSE-CAPILLARY: 91 mg/dL (ref 65–99)
Glucose-Capillary: 109 mg/dL — ABNORMAL HIGH (ref 65–99)
Glucose-Capillary: 98 mg/dL (ref 65–99)

## 2018-03-08 LAB — LACTIC ACID, PLASMA: LACTIC ACID, VENOUS: 1.4 mmol/L (ref 0.5–1.9)

## 2018-03-08 LAB — PROCALCITONIN

## 2018-03-08 LAB — APTT: APTT: 30 s (ref 24–36)

## 2018-03-08 MED ORDER — PRO-STAT SUGAR FREE PO LIQD
30.0000 mL | Freq: Two times a day (BID) | ORAL | Status: DC
  Administered 2018-03-08 – 2018-03-11 (×7): 30 mL via ORAL
  Filled 2018-03-08 (×7): qty 30

## 2018-03-08 MED ORDER — SILVER SULFADIAZINE 1 % EX CREA
TOPICAL_CREAM | Freq: Every day | CUTANEOUS | Status: DC
  Administered 2018-03-08 – 2018-03-11 (×4): via TOPICAL
  Filled 2018-03-08: qty 85

## 2018-03-08 MED ORDER — ENOXAPARIN SODIUM 40 MG/0.4ML ~~LOC~~ SOLN
40.0000 mg | Freq: Every day | SUBCUTANEOUS | Status: DC
  Administered 2018-03-08 – 2018-03-11 (×4): 40 mg via SUBCUTANEOUS
  Filled 2018-03-08 (×4): qty 0.4

## 2018-03-08 NOTE — Plan of Care (Signed)
  Problem: Education: Goal: Knowledge of General Education information will improve Outcome: Progressing   Problem: Clinical Measurements: Goal: Ability to maintain clinical measurements within normal limits will improve Outcome: Progressing   Problem: Clinical Measurements: Goal: Will remain free from infection Outcome: Progressing   Problem: Activity: Goal: Risk for activity intolerance will decrease Outcome: Progressing   Problem: Nutrition: Goal: Adequate nutrition will be maintained Outcome: Progressing

## 2018-03-08 NOTE — Evaluation (Addendum)
Physical Therapy Evaluation Patient Details Name: Mark Graham MRN: [id number] DOB: [birth year] Today's Date: 03/08/2018   History of Present Illness  Pt is a 62 y/o male admitted secondary to dehiscence of L transmetatarsal amputation. PMH includes DM, HTN, tobacco abuse, and s/p L transmetatarsal amputation.   Clinical Impression  Pt admitted secondary to problem above with deficits below. Upon eval, pt presenting with decreased safety awareness, decreased awareness of precautions, cognitive deficits and unsteadiness. REquired safety cues for maintenance of NWB on LLE and for safety cues with use of RW. Requiring min to min guard A for mobility. Feel pt is at an increased risk for falls and will have difficulty maintaining NWB at home. Pt reports he does not have anyone to help care for him at d/c. Pt may refuse current d/c recommendations, as pt wanting to d/c home. Will continue to follow acutely to maximize functional mobility deficits.     Follow Up Recommendations SNF;Supervision/Assistance - 24 hour    Equipment Recommendations  Rolling walker with 5" wheels(pt's RW broken )    Recommendations for Other Services       Precautions / Restrictions Precautions Precautions: Fall Restrictions Weight Bearing Restrictions: Yes LLE Weight Bearing: Non weight bearing      Mobility  Bed Mobility Overal bed mobility: Needs Assistance Bed Mobility: Supine to Sit     Supine to sit: Supervision     General bed mobility comments: Supervision for safety. Required safety cues to wait for PT to perform mobility tasks.   Transfers Overall transfer level: Needs assistance Equipment used: Rolling walker (2 wheeled) Transfers: Sit to/from Stand Sit to Stand: Min assist         General transfer comment: Min A for lift assist and steadying. Despite verbal cues, pt putting weight on LLE.   Ambulation/Gait Ambulation/Gait assistance: Min assist;Min guard Ambulation Distance (Feet): 20  Feet Assistive device: Rolling walker (2 wheeled) Gait Pattern/deviations: Step-to pattern Gait velocity: Decreased    General Gait Details: Hop to pattern during gait. Min to min guard for safety. Required safety cues to stay on task during ambulation, as pt easily distractible. Safety cues for safe use of RW during gait.   Stairs            Wheelchair Mobility    Modified Rankin (Stroke Patients Only)       Balance Overall balance assessment: Needs assistance Sitting-balance support: No upper extremity supported;Feet supported Sitting balance-Leahy Scale: Good     Standing balance support: Bilateral upper extremity supported;During functional activity Standing balance-Leahy Scale: Poor Standing balance comment: Reliant on BUE support.                              Pertinent Vitals/Pain Pain Assessment: 0-10 Pain Score: 6  Pain Location: L foot  Pain Descriptors / Indicators: Aching Pain Intervention(s): Limited activity within patient's tolerance;Monitored during session;Repositioned    Home Living Family/patient expects to be discharged to:: Private residence Living Arrangements: Alone Available Help at Discharge: Other (Comment)(none ) Type of Home: House Home Access: Stairs to enter Entrance Stairs-Rails: None Entrance Stairs-Number of Steps: 4 Home Layout: One level Home Equipment: Walker - 2 wheels;Wheelchair - manual(RW is broken )      Prior Function Level of Independence: Independent         Comments: Reports he is living in a deteriorating home and his wheelchair is outside so he can go down the street. Otherwise walks  within the house.      Hand Dominance   Dominant Hand: Right    Extremity/Trunk Assessment   Upper Extremity Assessment Upper Extremity Assessment: Overall WFL for tasks assessed    Lower Extremity Assessment Lower Extremity Assessment: LLE deficits/detail;Generalized weakness LLE Deficits / Details: L foot  bandaged.        Communication   Communication: No difficulties  Cognition Arousal/Alertness: Awake/alert Behavior During Therapy: Impulsive Overall Cognitive Status: Impaired/Different from baseline Area of Impairment: Following commands;Safety/judgement;Memory;Problem solving                     Memory: Decreased short-term memory;Decreased recall of precautions Following Commands: Follows one step commands with increased time Safety/Judgement: Decreased awareness of safety;Decreased awareness of deficits   Problem Solving: Slow processing;Requires verbal cues;Requires tactile cues General Comments: Pt easily distractable and requires cues to stay on task. Unsure if pt has been maintaining weightbearing precautions at home; when asked how he was performing steps, reports "oh, it was easy." Admits to being forgetful. Reports his WC is outside where the grass is grown up around it. Reports he uses it to go down the road.       General Comments      Exercises     Assessment/Plan    PT Assessment Patient needs continued PT services  PT Problem List Decreased strength;Decreased balance;Decreased mobility;Decreased cognition;Decreased knowledge of use of DME;Decreased safety awareness;Decreased knowledge of precautions;Pain       PT Treatment Interventions Gait training;DME instruction    PT Goals (Current goals can be found in the Care Plan section)  Acute Rehab PT Goals Patient Stated Goal: to go home  PT Goal Formulation: With patient Time For Goal Achievement: 03/22/18 Potential to Achieve Goals: Good    Frequency Min 2X/week   Barriers to discharge Decreased caregiver support Lives alone, and reports no one available to check on him.     Co-evaluation               AM-PAC PT "6 Clicks" Daily Activity  Outcome Measure Difficulty turning over in bed (including adjusting bedclothes, sheets and blankets)?: A Little Difficulty moving from lying on back  to sitting on the side of the bed? : A Little Difficulty sitting down on and standing up from a chair with arms (e.g., wheelchair, bedside commode, etc,.)?: Unable Help needed moving to and from a bed to chair (including a wheelchair)?: A Little Help needed walking in hospital room?: A Little Help needed climbing 3-5 steps with a railing? : A Lot 6 Click Score: 15    End of Session Equipment Utilized During Treatment: Gait belt Activity Tolerance: Patient tolerated treatment well Patient left: in chair;with call bell/phone within reach Nurse Communication: Mobility status PT Visit Diagnosis: Other abnormalities of gait and mobility (R26.89);Difficulty in walking, not elsewhere classified (R26.2)    Time: 1610-9604 PT Time Calculation (min) (ACUTE ONLY): 19 min   Charges:   PT Evaluation $PT Eval Moderate Complexity: 1 Mod     PT G Codes:        Gladys Damme, PT, DPT  Acute Rehabilitation Services  Pager: [phone]   Lehman Prom 03/08/2018, 12:04 PM

## 2018-03-08 NOTE — Consult Note (Signed)
WOC consult requested prior to ortho service involvement.  Dr Lajoyce Corners has assessed patient and ordered Silvadene for topical treatment.  Please refer to ortho service for further questions regarding plan of care. Please re-consult if further assistance is needed.  Thank-you,  Cammie Mcgee MSN, RN, CWOCN, [place], CNS [phone]

## 2018-03-08 NOTE — Progress Notes (Signed)
PROGRESS NOTE        PATIENT DETAILS Name: Mark Graham Age: 62 y.o. Sex: male Date of Birth: 08-Aug-1956 Admit Date: 03/07/2018 Admitting Physician Lorretta Harp, MD RUE:[id number], Odette Horns, MD  Brief Narrative: Patient is a 62 y.o. 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 Lajoyce Corners, he was supposed to be nonweightbearing-but unfortunately continue to bear weight-sent from his PCPs office for dehiscence of his transmetatarsal stump.  Subjective: Lying comfortably in bed-no discharge or pain evident at his left transmetatarsal stump site.  Some stitches are in place-detail portion of the wound appears to be open.  Denies any fever.  Assessment/Plan: Left transmetatarsal stump dehiscence: No sign of infection-afebrile-no purulence noted at the stump site.  Remains on empiric antimicrobial treatment with vancomycin.  Spoke with Dr. Kirstie Mirza does not recommend pursuing MRI.  Recommendations are to obtain physical therapy evaluation, and compliance to nonweightbearing status.  Dr. Lajoyce Corners recommends antibiotics for total of 2 weeks-we will await cultures-if cultures remain negative-suspect we could transition to doxycycline on discharge.  Acute kidney injury: Likely hemodynamically mediated-resolved with gentle hydration.  Hypertension: Controlled-continue amlodipine.  Dyslipidemia: Continue statin  DM-2: CBG stable-continue SSI-resume metformin on discharge  Tobacco abuse: Counseled  DVT Prophylaxis: Prophylactic Lovenox   Code Status: Full code   Family Communication: None at bedside  Disposition Plan: Remain inpatient-remain inpatient for another day or so-await physical therapy evaluation.  Antimicrobial agents: Anti-infectives (From admission, onward)   Start     Dose/Rate Route Frequency Ordered Stop   03/08/18 1000  vancomycin (VANCOCIN) IVPB 750 mg/150 ml premix     750 mg 150 mL/hr over 60 Minutes Intravenous Every 12 hours 03/07/18 2123     03/07/18 2130  vancomycin (VANCOCIN) 1,500 mg in sodium chloride 0.9 % 500 mL IVPB     1,500 mg 250 mL/hr over 120 Minutes Intravenous  Once 03/07/18 2123 03/08/18 0014      Procedures: None  CONSULTS:  None  Time spent: 25- minutes-Greater than 50% of this time was spent in counseling, explanation of diagnosis, planning of further management, and coordination of care.  MEDICATIONS: Scheduled Meds: . amLODipine  5 mg Oral Daily  . atorvastatin  20 mg Oral q1800  . insulin aspart  0-5 Units Subcutaneous QHS  . insulin aspart  0-9 Units Subcutaneous TID WC  . nicotine  21 mg Transdermal Daily  . QUEtiapine  50 mg Oral QHS  . silver sulfADIAZINE   Topical Daily   Continuous Infusions: . sodium chloride 10 mL/hr at 03/08/18 0945  . sodium chloride    . vancomycin Stopped (03/08/18 1000)   PRN Meds:.acetaminophen, docusate sodium, hydrALAZINE, HYDROcodone-acetaminophen, ondansetron **OR** ondansetron (ZOFRAN) IV, zolpidem   PHYSICAL EXAM: Vital signs: Vitals:   03/07/18 2300 03/08/18 0036 03/08/18 0533 03/08/18 1151  BP: (!) 175/113 (!) 170/96 121/83   Pulse: 81 76 72   Resp: Temp:  97.9 F (36.6 C) 97.7 F (36.5 C)   TempSrc:  Oral Oral   SpO2: 96% 98% 94%   Weight:    69.9 kg (154 lb)  Height:     (1.803 m)   Filed Weights   03/08/18 1151  Weight: 69.9 kg (154 lb)   Body mass index is  21.48 kg/m.   General appearance :Awake, alert, not in any distress.  Eyes:, pupils equally reactive to light and accomodation,no scleral icterus.Pink conjunctiva HEENT: Atraumatic and Normocephalic Neck: supple, no JVD. No cervical lymphadenopathy. No thyromegaly Resp:Good air entry bilaterally, no added sounds  CVS: S1 S2 regular, no murmurs.  GI: Bowel sounds present, Non tender and not distended with no gaurding,  rigidity or rebound.No organomegaly Extremities: B/L Lower Ext shows no edema, both legs are warm to touch.  Left foot with dehiscence in the middle/medial aspect of the Neurology:  speech clear,Non focal, sensation is grossly intact. Psychiatric: Normal judgment and insight. Alert and oriented x 3. Normal mood. Musculoskeletal:No digital cyanosis Skin:No Rash, warm and dry Wounds:N/A  I have personally reviewed following labs and imaging studies  LABORATORY DATA: CBC: Recent Labs  Lab 03/07/18 1841 03/08/18 0101  WBC 10.4 6.5  NEUTROABS 7.1  --   HGB 14.5 13.1  HCT 44.2 40.3  MCV 99.3 98.5  PLT 307 264    Basic Metabolic Panel: Recent Labs  Lab 03/07/18 1841 03/08/18 0101  NA 139 141  K 4.1 3.8  CL 100* 105  CO2 29 28  GLUCOSE 190* 170*  BUN 15 13  CREATININE 1.24 1.12  CALCIUM 9.4 8.9    GFR: Estimated Creatinine Clearance: 68.5 mL/min (by C-G formula based on SCr of 1.12 mg/dL).  Liver Function Tests: Recent Labs  Lab 03/07/18 1841  AST 21  ALT 13*  ALKPHOS 99  BILITOT 0.6  PROT 7.1  ALBUMIN 3.9   No results for input(s): LIPASE, AMYLASE in the last 168 hours. No results for input(s): AMMONIA in the last 168 hours.  Coagulation Profile: Recent Labs  Lab 03/08/18 0101  INR 0.99    Cardiac Enzymes: No results for input(s): CKTOTAL, CKMB, CKMBINDEX, TROPONINI in the last 168 hours.  BNP (last 3 results) No results for input(s): PROBNP in the last 8760 hours.  HbA1C: Recent Labs    03/07/18 1434  HGBA1C 6.5    CBG: Recent Labs  Lab 03/07/18 2256 03/08/18 0616 03/08/18 1131  GLUCAP 140* 109* 179*    Lipid Profile: No results for input(s): CHOL, HDL, LDLCALC, TRIG, CHOLHDL, LDLDIRECT in the last 72 hours.  Thyroid Function Tests: No results for input(s): TSH, T4TOTAL, FREET4, T3FREE, THYROIDAB in the last 72 hours.  Anemia Panel: No results for input(s): VITAMINB12, FOLATE, FERRITIN, TIBC, IRON, RETICCTPCT in the last 72  hours.  Urine analysis:    Component Value Date/Time   COLORURINE YELLOW 01/13/2018 0041   APPEARANCEUR HAZY (A) 01/13/2018 0041   LABSPEC 1.005 01/13/2018 0041   PHURINE 6.0 01/13/2018 0041   GLUCOSEU NEGATIVE 01/13/2018 0041   HGBUR SMALL (A) 01/13/2018 0041   BILIRUBINUR NEGATIVE 01/13/2018 0041   KETONESUR NEGATIVE 01/13/2018 0041   PROTEINUR NEGATIVE 01/13/2018 0041   UROBILINOGEN 1.0 01/15/2015 1946   NITRITE NEGATIVE 01/13/2018 0041   LEUKOCYTESUR LARGE (A) 01/13/2018 0041    Sepsis Labs: Lactic Acid, Venous    Component Value Date/Time   LATICACIDVEN 1.4 03/08/2018 0101    MICROBIOLOGY: Recent Results (from the past 240 hour(s))  Wound or Superficial Culture     Status: None (Preliminary result)   Collection Time: 03/07/18  8:30 PM  Result Value Ref Range Status   Specimen Description WOUND LEFT FOOT  Final   Special Requests NONE  Final   Gram Stain   Final    ABUNDANT WBC PRESENT, PREDOMINANTLY PMN ABUNDANT GRAM POSITIVE COCCI ABUNDANT GRAM NEGATIVE RODS  ABUNDANT GRAM POSITIVE RODS    Culture   Final    TOO YOUNG TO READ Performed at [facility], [address], [place], [place] [address]    Report Status PENDING  Incomplete  Blood Cultures x 2 sites     Status: None (Preliminary result)   Collection Time: 03/07/18  8:52 PM  Result Value Ref Range Status   Specimen Description BLOOD RIGHT ARM  Final   Special Requests   Final    BOTTLES DRAWN AEROBIC AND ANAEROBIC Blood Culture adequate volume   Culture   Final    NO GROWTH < 12 HOURS Performed at [facility], [address] [place], [place] [address]    Report Status PENDING  Incomplete    RADIOLOGY STUDIES/RESULTS: Dg Foot Complete Left  Result Date: 03/07/2018 CLINICAL DATA:  Foot infection. EXAM: LEFT FOOT - COMPLETE 3+ VIEW COMPARISON:  Radiographs of January 13, 2018. FINDINGS: Status post surgical amputation at the level of the proximal metatarsals. No lytic destruction is seen  at this time to suggest acute osteomyelitis. No radiopaque foreign body is noted. Dorsal soft tissue swelling is noted which may be postoperative in etiology. IMPRESSION: Postsurgical changes as described above. No definite lytic destruction is seen to suggest osteomyelitis. Electronically Signed   By: Lupita Raider, M.D.   On: 03/07/2018 19:24     LOS: 1 day   Jeoffrey Massed, MD  [facility] Pager:[phone]  If 7PM-7AM, please contact night-coverage [email or web address] Password [facility] 03/08/2018, 12:02 PM

## 2018-03-08 NOTE — Progress Notes (Addendum)
Initial Nutrition Assessment  DOCUMENTATION CODES:   Not applicable  INTERVENTION:    Prostat liquid protein po 30 ml BID with meals, each supplement provides 100 kcal, 15 grams protein  NUTRITION DIAGNOSIS:   Increased nutrient needs related to wound healing as evidenced by estimated needs  GOAL:   Patient will meet greater than or equal to 90% of their needs  MONITOR:   PO intake, Supplement acceptance, Labs, Skin, Weight trends, I & O's  REASON FOR ASSESSMENT:   Consult Wound healing  ASSESSMENT:   62 y.o. Male with PMH significant of HTN, hyperlipidemia, diabetes mellitus, depression, tobacco abuse, left foot osteomyelitis, who presented with left foot pain.  RD met with pt at bedside. He reports a good appetite. PO intake 100% per flowsheets. Pt's eating schedule PTA "depends" but usually includes 3 meals per day.  No recent unintentional weight loss reported.  Labs and medications reviewed. CBG's 140-109-179.  Pt does not drink any nutrition supplements at home because "they are too expensive". Amenable to receiving liquid protein supplements during acute hospitalization. Discussed need for increased protein for wound healing.  NUTRITION - FOCUSED PHYSICAL EXAM:    Most Recent Value  Orbital Region  No depletion  Upper Arm Region  No depletion  Thoracic and Lumbar Region  No depletion  Buccal Region  No depletion  Temple Region  No depletion  Clavicle Bone Region  Mild depletion  Clavicle and Acromion Bone Region  Mild depletion  Scapular Bone Region  No depletion  Dorsal Hand  No depletion  Patellar Region  Mild depletion  Anterior Thigh Region  Mild depletion  Posterior Calf Region  Mild depletion  Edema (RD Assessment)  None     Diet Order:   Diet Order           Diet heart healthy/carb modified Room service appropriate? Yes; Fluid consistency: Thin  Diet effective now         EDUCATION NEEDS:   No education needs have been identified at  this time  Skin:  Skin Assessment: Skin Integrity Issues: Skin Integrity Issues:: Other (Comment) Other: diabetic L foot wound infection  Last BM:  N/A  Height:   Ht Readings from Last 1 Encounters:  03/08/18 _0  (1.803 m)   Weight:   Wt Readings from Last 1 Encounters:  03/08/18 154 lb (69.9 kg)   Wt Readings from Last 10 Encounters:  03/08/18 154 lb (69.9 kg)  03/07/18 154 lb 12.8 oz (70.2 kg)  01/13/18 144 lb (65.3 kg)  11/24/17 144 lb 2.9 oz (65.4 kg)  03/19/17 162 lb (73.5 kg)  02/16/17 164 lb (74.4 kg)  02/02/17 171 lb (77.6 kg)  01/25/17 163 lb (73.9 kg)  01/05/17 172 lb (78 kg)  12/16/16 172 lb 9.6 oz (78.3 kg)   Ideal Body Weight:  78.1 kg  BMI:  Body mass index is 21.48 kg/m.  Estimated Nutritional Needs:   Kcal:  1800-2000  Protein:  90-105 gm  Fluid:  1.8-2.0 L  Arthur Holms, RD, LDN Pager #: [phone] After-Hours Pager #: [phone]

## 2018-03-08 NOTE — Consult Note (Signed)
ORTHOPAEDIC CONSULTATION  REQUESTING PHYSICIAN: Maretta Bees, MD  Chief Complaint: Dehiscence left transmetatarsal amputation.  HPI: Mark Graham is a 62 y.o. male who presents with dehiscence left transmetatarsal amputation.  Patient is a diabetic a smoker he was lost to follow-up and presents at this time full weightbearing on the left foot with wound dehiscence.  Past Medical History:  Diagnosis Date  . Arthritis    "probably in my hands" (11/24/2017)  . Cellulitis of left foot 11/24/2017  . High cholesterol   . Hypertension   . Osteomyelitis of left foot (HCC)    Osteomyelitis of left hallux with overlying cellulitis left foot/notes 11/24/2017  . Tobacco abuse   . Tuberculosis ~ 1990   "had tx for it"  . Type II diabetes mellitus (HCC)    Past Surgical History:  Procedure Laterality Date  . AMPUTATION Left 11/25/2017   Procedure: TRANSMETATARSAL AMPUTATION;  Surgeon: Roby Lofts, MD;  Location: [facility] OR;  Service: Orthopedics;  Laterality: Left;  . AMPUTATION Left 01/15/2018   Procedure: REVISION TRANS METATARSAL AMPUTATION;  Surgeon: Nadara Mustard, MD;  Location: [facility] OR;  Service: Orthopedics;  Laterality: Left;  . NO PAST SURGERIES     Social History   Socioeconomic History  . Marital status: Single    Spouse name: Not on file  . Number of children: Not on file  . Years of education: Not on file  . Highest education level: Not on file  Occupational History  . Not on file  Social Needs  . Financial resource strain: Not on file  . Food insecurity:    Worry: Not on file    Inability: Not on file  . Transportation needs:    Medical: Not on file    Non-medical: Not on file  Tobacco Use  . Smoking status: Current Every Day Smoker    Packs/day: 1.00    Years: 46.00    Pack years: 46.00    Types: Cigarettes  . Smokeless tobacco: Former Neurosurgeon    Types: Chew  Substance and Sexual Activity  . Alcohol use: Yes    Alcohol/week: 3.6 oz    Types: 6 Cans of  beer per week  . Drug use: Yes    Types: Marijuana    Comment: 11/24/2017 "nothing since ~ 2002"  . Sexual activity: Never  Lifestyle  . Physical activity:    Days per week: Not on file    Minutes per session: Not on file  . Stress: Not on file  Relationships  . Social connections:    Talks on phone: Not on file    Gets together: Not on file    Attends religious service: Not on file    Active member of club or organization: Not on file    Attends meetings of clubs or organizations: Not on file    Relationship status: Not on file  Other Topics Concern  . Not on file  Social History Narrative  . Not on file   Family History  Problem Relation Age of Onset  . Cancer Father   . Cancer Sister   . Diabetes Mellitus II Unknown        States 6 out of 13 of his siblings    - negative except otherwise stated in the family history section No Known Allergies Prior to Admission medications   Medication Sig Start Date End Date Taking? Authorizing Provider  acetaminophen (TYLENOL) 325 MG tablet Take 1-2 tablets (325-650 mg total) by  mouth every 6 (six) hours as needed for mild pain (pain score 1-3 or temp > 100.5). Patient not taking: Reported on 03/07/2018 01/17/18   Calvert Cantor, MD  atorvastatin (LIPITOR) 20 MG tablet Take 1 tablet (20 mg total) by mouth daily at 6 PM. 03/07/18   Hoy Register, MD  bisacodyl (DULCOLAX) 10 MG suppository Place 1 suppository (10 mg total) rectally daily as needed for moderate constipation. Patient not taking: Reported on 03/07/2018 01/17/18   Calvert Cantor, MD  Blood Glucose Monitoring Suppl (TRUE METRIX METER) DEVI 1 each by Does not apply route daily. Patient not taking: Reported on 01/13/2018 11/29/17   Scherrie Gerlach, MD  docusate sodium (COLACE) 100 MG capsule Take 1 capsule (100 mg total) by mouth 2 (two) times daily. Patient not taking: Reported on 03/07/2018 01/17/18   Calvert Cantor, MD  HYDROcodone-acetaminophen (NORCO/VICODIN) 5-325 MG tablet Take 1  tablet by mouth every 4 (four) hours as needed for moderate pain (pain score 4-6). Patient not taking: Reported on 03/07/2018 01/17/18   Calvert Cantor, MD  lisinopril (PRINIVIL,ZESTRIL) 10 MG tablet Take 1 tablet (10 mg total) by mouth daily. 03/07/18   Hoy Register, MD  metFORMIN (GLUCOPHAGE) 500 MG tablet Take 1 tablet (500 mg total) by mouth 2 (two) times daily with a meal. 03/07/18   Hoy Register, MD  polyethylene glycol (MIRALAX) packet Take 17 g by mouth daily as needed for mild constipation. Patient not taking: Reported on 03/07/2018 01/17/18   Calvert Cantor, MD  QUEtiapine (SEROQUEL) 50 MG tablet Take 1 tablet (50 mg total) by mouth at bedtime. Patient not taking: Reported on 03/07/2018 11/29/17   Scherrie Gerlach, MD   Dg Foot Complete Left  Result Date: 03/07/2018 CLINICAL DATA:  Foot infection. EXAM: LEFT FOOT - COMPLETE 3+ VIEW COMPARISON:  Radiographs of January 13, 2018. FINDINGS: Status post surgical amputation at the level of the proximal metatarsals. No lytic destruction is seen at this time to suggest acute osteomyelitis. No radiopaque foreign body is noted. Dorsal soft tissue swelling is noted which may be postoperative in etiology. IMPRESSION: Postsurgical changes as described above. No definite lytic destruction is seen to suggest osteomyelitis. Electronically Signed   By: Lupita Raider, M.D.   On: 03/07/2018 19:24   - pertinent xrays, CT, MRI studies were reviewed and independently interpreted  Positive ROS: All other systems have been reviewed and were otherwise negative with the exception of those mentioned in the HPI and as above.  Physical Exam: General: Alert, no acute distress Psychiatric: Patient is competent for consent with normal mood and affect Lymphatic: No axillary or cervical lymphadenopathy Cardiovascular: No pedal edema Respiratory: No cyanosis, no use of accessory musculature GI: No organomegaly, abdomen is soft and  non-tender    Images:  @  Labs:  Lab Results  Component Value Date   HGBA1C 6.5 03/07/2018   HGBA1C 7.2 (H) 11/25/2017   HGBA1C 7.2 (H) 12/13/2016   ESRSEDRATE 13 03/07/2018   ESRSEDRATE 28 (H) 01/14/2018   ESRSEDRATE 12 10/21/2015   CRP 0.8 03/07/2018   CRP 1.8 (H) 01/14/2018   CRP 0.8 10/21/2015   REPTSTATUS PENDING 03/07/2018   GRAMSTAIN  03/07/2018    ABUNDANT WBC PRESENT, PREDOMINANTLY PMN ABUNDANT GRAM POSITIVE COCCI ABUNDANT GRAM NEGATIVE RODS ABUNDANT GRAM POSITIVE RODS Performed at [facility], [address], [place], [place] [address]    CULT PENDING 03/07/2018   LABORGA ESCHERICHIA COLI (A) 01/22/2017    Lab Results  Component Value Date  ALBUMIN 3.9 03/07/2018   ALBUMIN 3.6 01/13/2018   ALBUMIN 3.2 (L) 11/24/2017   PREALBUMIN 24.8 12/13/2016    Neurologic: Patient does not have protective sensation bilateral lower extremities.   MUSCULOSKELETAL:   Skin: Examination there is no redness no cellulitis surrounding the wound.  The wound has dehisced about 10 mm in diameter about 3 mm deep there is granulation tissue at the wound bed without exposed bone or tendon.  Patient has wound dehiscence of the mid aspect of the wound there is no cellulitis no drainage no odor no signs of infection.  Proximally three quarters of the wound has completely healed.  Assessment: Assessment: Dehiscence left transmetatarsal amputation.    Plan: Plan: Discussed with the patient the importance of nonweightbearing I will order physical therapy.  Patient will need to start Silvadene dressing changes with Dial soap cleansing daily and I will order suture removal.  Discussed the importance of smoking cessation diabetic control.  Do not feel an MRI scan is necessary at this time.  Thank you for the consult and the opportunity to see Mr. Rajon Bisig, MD [facility] [phone] 7:58 AM

## 2018-03-09 ENCOUNTER — Telehealth: Payer: Self-pay | Admitting: Family Medicine

## 2018-03-09 ENCOUNTER — Telehealth: Payer: Self-pay

## 2018-03-09 DIAGNOSIS — T8130XA Disruption of wound, unspecified, initial encounter: Principal | ICD-10-CM

## 2018-03-09 LAB — GLUCOSE, CAPILLARY
GLUCOSE-CAPILLARY: 135 mg/dL — AB (ref 65–99)
GLUCOSE-CAPILLARY: 136 mg/dL — AB (ref 65–99)
GLUCOSE-CAPILLARY: 106 mg/dL — AB (ref 65–99)
Glucose-Capillary: 104 mg/dL — ABNORMAL HIGH (ref 65–99)

## 2018-03-09 NOTE — NC FL2 (Signed)
[place] MEDICAID FL2 LEVEL OF CARE SCREENING TOOL     IDENTIFICATION  Patient Name: Mark Graham Birthdate: 1956/04/18 Sex: male Admission Date (Current Location): 03/07/2018  [facility] and [place] Number:  Producer, television/film/video and Address:  [facility] [place], [address] [place], [place] [address]      Provider Number: [id number]  Attending Physician Name and Address:  Maretta Bees, MD  Relative Name and Phone Number:       Current Level of Care: Hospital Recommended Level of Care: Skilled Nursing Facility Prior Approval Number:    Date Approved/Denied:   PASRR Number: [id number]  Discharge Plan: SNF    Current Diagnoses: Patient Active Problem List   Diagnosis Date Noted  . AKI (acute kidney injury) (HCC) 03/08/2018  . HLD (hyperlipidemia) 03/07/2018  . Left foot infection 03/07/2018  . Wound infection after surgery   . Diabetes mellitus type 2 in nonobese (HCC) 01/14/2018  . Normochromic normocytic anemia 01/14/2018  . Osteomyelitis of left foot (HCC) 01/14/2018  . Foot osteomyelitis, left (HCC) 01/14/2018  . S/P transmetatarsal amputation of foot, left (HCC)   . Frostbite 11/24/2017  . Cellulitis 11/24/2017  . Depression 02/02/2017  . Insomnia 02/02/2017  . Protein-calorie malnutrition, severe 01/24/2017  . Urinary tract infection without hematuria   . Psychosis (HCC) 12/13/2016  . Weight loss 12/13/2016  . Abnormal ECG 12/13/2016  . Iron overload 12/13/2016  . Elevated bilirubin 12/13/2016  . PSA elevation 12/13/2016  . Diabetes mellitus type 2, controlled, with complications (HCC) 12/12/2016  . Tobacco abuse 10/21/2015  . Hyperglycemia 10/21/2015  . Urinary frequency 10/21/2015  . RUQ abdominal pain 10/21/2015  . Hypertension 03/03/2014  . Hyponatremia 03/03/2014  . Chest pain 03/03/2014  . Leukocytosis 03/03/2014  . Urinary retention 03/03/2014    Orientation RESPIRATION BLADDER Height & Weight     Self, Time,  Situation, Place  Normal Continent Weight: 154 lb (69.9 kg) Height:   (180.3 cm)  BEHAVIORAL SYMPTOMS/MOOD NEUROLOGICAL BOWEL NUTRITION STATUS      Continent Diet(heart healthy, carb modified)  AMBULATORY STATUS COMMUNICATION OF NEEDS Skin   Extensive Assist Verbally Other (Comment)(open wound, left foot; gauze dressing)                       Personal Care Assistance Level of Assistance  Bathing, Feeding, Dressing Bathing Assistance: Limited assistance Feeding assistance: Independent Dressing Assistance: Limited assistance     Functional Limitations Info  Hearing, Sight, Speech Sight Info: Adequate Hearing Info: Adequate Speech Info: Adequate    SPECIAL CARE FACTORS FREQUENCY  PT (By licensed PT), OT (By licensed OT)     PT Frequency: 5x/wk OT Frequency: 5x/wk            Contractures Contractures Info: Not present    Additional Factors Info  Code Status, Allergies, Psychotropic, Insulin Sliding Scale Code Status Info: Full Allergies Info: NKA Psychotropic Info: Seroquel  daily at bedtime Insulin Sliding Scale Info: 0-9 units 3x/day with meals; 0-5 units daily at bedtime       Current Medications (03/09/2018):  This is the current hospital active medication list Current Facility-Administered Medications  Medication Dose Route Frequency Provider Last Rate Last Dose  . 0.9 %  sodium chloride infusion   Intravenous Continuous Maretta Bees, MD 10 mL/hr at 03/08/18 0945    . acetaminophen (TYLENOL) tablet 650 mg  650 mg Oral Q6H PRN Lorretta Harp, MD      .  amLODipine (NORVASC) tablet 5 mg  5 mg Oral Daily Lorretta Harp, MD   5 mg at 03/09/18 1037  . atorvastatin (LIPITOR) tablet 20 mg  20 mg Oral q1800 Lorretta Harp, MD   20 mg at 03/08/18 1746  . docusate sodium (COLACE) capsule 100 mg  100 mg Oral BID PRN Lorretta Harp, MD      . enoxaparin (LOVENOX) injection 40 mg  40 mg Subcutaneous Daily Maretta Bees, MD   40 mg at 03/09/18 1037  . feeding  supplement (PRO-STAT SUGAR FREE 64) liquid 30 mL  30 mL Oral BID Maretta Bees, MD   30 mL at 03/09/18 1037  . hydrALAZINE (APRESOLINE) injection 5 mg  5 mg Intravenous Q2H PRN Lorretta Harp, MD      . HYDROcodone-acetaminophen (NORCO/VICODIN) 5-325 MG per tablet 1 tablet  1 tablet Oral Q4H PRN Lorretta Harp, MD   1 tablet at 03/08/18 2235  . insulin aspart (novoLOG) injection 0-5 Units  0-5 Units Subcutaneous QHS Lorretta Harp, MD   Stopped at 03/07/18 2257  . insulin aspart (novoLOG) injection 0-9 Units  0-9 Units Subcutaneous TID WC Lorretta Harp, MD   2 Units at 03/08/18 1219  . nicotine (NICODERM CQ - dosed in mg/24 hours) patch 21 mg  21 mg Transdermal Daily Lorretta Harp, MD   21 mg at 03/09/18 1037  . ondansetron (ZOFRAN) tablet 4 mg  4 mg Oral Q6H PRN Lorretta Harp, MD       Or  . ondansetron [facility]) injection 4 mg  4 mg Intravenous Q6H PRN Lorretta Harp, MD      . QUEtiapine (SEROQUEL) tablet 50 mg  50 mg Oral QHS Lorretta Harp, MD   50 mg at 03/08/18 2235  . silver sulfADIAZINE (SILVADENE) 1 % cream   Topical Daily Aldean Baker V, MD      . sodium chloride 0.9 % bolus 2,000 mL  2,000 mL Intravenous Once Lorretta Harp, MD      . vancomycin (VANCOCIN) IVPB 750 mg/150 ml premix  750 mg Intravenous Q12H Lorretta Harp, MD 150 mL/hr at 03/09/18 1037 750 mg at 03/09/18 1037  . zolpidem (AMBIEN) tablet 5 mg  5 mg Oral QHS PRN Lorretta Harp, MD   5 mg at 03/07/18 2258     Discharge Medications: Please see discharge summary for a list of discharge medications.  Relevant Imaging Results:  Relevant Lab Results:   Additional Information SS#: [id number]  Baldemar Lenis, LCSW

## 2018-03-09 NOTE — Telephone Encounter (Signed)
The patient is known to the [facility].   This CM spoke to Vance Peper, RN CM and informed her of the patient's situation at home and lack of support. She noted that they are considering SNF at this time.

## 2018-03-09 NOTE — Telephone Encounter (Signed)
Briefly met with patient during his admission at [facility]. Introduced myself to patient and explained why I was there. Patient was agreeable to schedule an appointment, it has been scheduled for 03/18/18 @ 2:10pm. Patient is aware of his condition.   During our conversation patient confirmed that he still lives at the current address on Epic, and the phone number is correct. Patient asked for his brother be taken off his emergency contacts. Patient lives on his own and has no support. Patient mentioned that maybe he will ask his sister if he can stay with her following his discharged. Patient was not sure when he would be discharged.  Informed patient that he will receive a call from Opal Sidles, case manager, or myself following his discharge to check on his status. Patient understood.

## 2018-03-09 NOTE — Progress Notes (Signed)
PROGRESS NOTE        PATIENT DETAILS Name: Mark Graham Age: 62 y.o. Sex: male Date of Birth: 02/20/1956 Admit Date: 03/07/2018 Admitting Physician Lorretta Harp, MD JXB:[id number], Odette Horns, MD  Brief Narrative: Patient is a 62 y.o. 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 Lajoyce Corners, he was supposed to be nonweightbearing-but unfortunately continue to bear weight-sent from his PCPs office for dehiscence of his transmetatarsal stump.  Subjective: No major issues overnight.  Left foot bandaged-not soaked.  Assessment/Plan: Left transmetatarsal stump dehiscence: No signs of infection-afebrile-no purulence noted-remains on antimicrobial therapy.  No sign of infection-afebrile-no purulence noted at the stump site.  Remains on empiric vancomycin-cultures are negative so far.  Spoke with Dr. Lajoyce Corners on 5/14-did not recommend pursuing imaging including MRI of the foot-recommends that we treat with antibiotics for a total of 2 weeks.  Appreciate physical therapy evaluation-recommendations are for SNF.  Have discussed with case management who will let social worker know.  If cultures remain negative-we will transition to doxycycline in the next day or so.  Acute kidney injury: Hemodynamically mediated-resolved with supportive care  Hypertension: Controlled-continue amlodipine  Dyslipidemia: Statin  DM-2: CBG stable-continue SSI-resume metformin on discharge  Tobacco abuse: Counseled  Noncompliance to follow-up and non-weightbearing status: Counseled.  DVT Prophylaxis: Prophylactic Lovenox   Code Status: Full code   Family Communication: None at bedside  Disposition Plan: Remain inpatient-SNF on discharge when bed available  Antimicrobial agents: Anti-infectives (From admission, onward)   Start     Dose/Rate Route  Frequency Ordered Stop   03/08/18 1000  vancomycin (VANCOCIN) IVPB 750 mg/150 ml premix     750 mg 150 mL/hr over 60 Minutes Intravenous Every 12 hours 03/07/18 2123     03/07/18 2130  vancomycin (VANCOCIN) 1,500 mg in sodium chloride 0.9 % 500 mL IVPB     1,500 mg 250 mL/hr over 120 Minutes Intravenous  Once 03/07/18 2123 03/08/18 0014      Procedures: None  CONSULTS:  None  Time spent: 25- minutes-Greater than 50% of this time was spent in counseling, explanation of diagnosis, planning of further management, and coordination of care.  MEDICATIONS: Scheduled Meds: . amLODipine  5 mg Oral Daily  . atorvastatin  20 mg Oral q1800  . enoxaparin (LOVENOX) injection  40 mg Subcutaneous Daily  . feeding supplement (PRO-STAT SUGAR FREE 64)  30 mL Oral BID  . insulin aspart  0-5 Units Subcutaneous QHS  . insulin aspart  0-9 Units Subcutaneous TID WC  . nicotine  21 mg Transdermal Daily  . QUEtiapine  50 mg Oral QHS  . silver sulfADIAZINE   Topical Daily   Continuous Infusions: . sodium chloride 10 mL/hr at 03/08/18 0945  . sodium chloride    . vancomycin 750 mg (03/09/18 1037)   PRN Meds:.acetaminophen, docusate sodium, hydrALAZINE, HYDROcodone-acetaminophen, ondansetron **OR** ondansetron (ZOFRAN) IV, zolpidem   PHYSICAL EXAM: Vital signs: Vitals:   03/08/18 1355 03/08/18 2009 03/09/18 0405 03/09/18 0412  BP: 134/87 (!) 134/93 (!) 152/90 (!) 152/90  Pulse: 82 71 73 81  Resp: Temp: (!) 97.5 F (36.4 C) 97.7 F (36.5 C) 98 F (36.7 C) 98 F (36.7 C)  TempSrc: Oral Oral Oral Oral  SpO2: 96% 95% 94%  97%  Weight:      Height:       Filed Weights   03/08/18 1151  Weight: 69.9 kg (154 lb)   Body mass index is 21.48 kg/m.   General appearance :Awake, alert, not in any distress.  Eyes:, pupils equally reactive to light and accomodation,no scleral icterus. HEENT: Atraumatic and Normocephalic Neck: supple, no JVD. Resp:Good air entry bilaterally, clear to  auscultation CVS: S1 S2 regular, no murmurs.  GI: Bowel sounds present, Non tender and not distended with no gaurding, rigidity or rebound. Extremities: Left foot bandaged-did not open-but no soakage.   Neurology:  speech clear,Non focal, sensation is grossly intact. Psychiatric: Normal judgment and insight. Normal mood. Musculoskeletal:No digital cyanosis Skin:No Rash, warm and dry Wounds:N/A  I have personally reviewed following labs and imaging studies  LABORATORY DATA: CBC: Recent Labs  Lab 03/07/18 1841 03/08/18 0101  WBC 10.4 6.5  NEUTROABS 7.1  --   HGB 14.5 13.1  HCT 44.2 40.3  MCV 99.3 98.5  PLT 307 264    Basic Metabolic Panel: Recent Labs  Lab 03/07/18 1841 03/08/18 0101  NA 139 141  K 4.1 3.8  CL 100* 105  CO2 29 28  GLUCOSE 190* 170*  BUN 15 13  CREATININE 1.24 1.12  CALCIUM 9.4 8.9    GFR: Estimated Creatinine Clearance: 68.5 mL/min (by C-G formula based on SCr of 1.12 mg/dL).  Liver Function Tests: Recent Labs  Lab 03/07/18 1841  AST 21  ALT 13*  ALKPHOS 99  BILITOT 0.6  PROT 7.1  ALBUMIN 3.9   No results for input(s): LIPASE, AMYLASE in the last 168 hours. No results for input(s): AMMONIA in the last 168 hours.  Coagulation Profile: Recent Labs  Lab 03/08/18 0101  INR 0.99    Cardiac Enzymes: No results for input(s): CKTOTAL, CKMB, CKMBINDEX, TROPONINI in the last 168 hours.  BNP (last 3 results) No results for input(s): PROBNP in the last 8760 hours.  HbA1C: Recent Labs    03/07/18 1434  HGBA1C 6.5    CBG: Recent Labs  Lab 03/08/18 1131 03/08/18 1617 03/08/18 2119 03/09/18 0622 03/09/18 1126  GLUCAP 179* 91 98 104* 135*    Lipid Profile: No results for input(s): CHOL, HDL, LDLCALC, TRIG, CHOLHDL, LDLDIRECT in the last 72 hours.  Thyroid Function Tests: No results for input(s): TSH, T4TOTAL, FREET4, T3FREE, THYROIDAB in the last 72 hours.  Anemia Panel: No results for input(s): VITAMINB12, FOLATE,  FERRITIN, TIBC, IRON, RETICCTPCT in the last 72 hours.  Urine analysis:    Component Value Date/Time   COLORURINE YELLOW 01/13/2018 0041   APPEARANCEUR HAZY (A) 01/13/2018 0041   LABSPEC 1.005 01/13/2018 0041   PHURINE 6.0 01/13/2018 0041   GLUCOSEU NEGATIVE 01/13/2018 0041   HGBUR SMALL (A) 01/13/2018 0041   BILIRUBINUR NEGATIVE 01/13/2018 0041   KETONESUR NEGATIVE 01/13/2018 0041   PROTEINUR NEGATIVE 01/13/2018 0041   UROBILINOGEN 1.0 01/15/2015 1946   NITRITE NEGATIVE 01/13/2018 0041   LEUKOCYTESUR LARGE (A) 01/13/2018 0041    Sepsis Labs: Lactic Acid, Venous    Component Value Date/Time   LATICACIDVEN 1.4 03/08/2018 0101    MICROBIOLOGY: Recent Results (from the past 240 hour(s))  Wound or Superficial Culture     Status: None (Preliminary result)   Collection Time: 03/07/18  8:30 PM  Result Value Ref Range Status   Specimen Description WOUND LEFT FOOT  Final   Special Requests NONE  Final   Gram Stain   Final    ABUNDANT  WBC PRESENT, PREDOMINANTLY PMN ABUNDANT GRAM POSITIVE COCCI ABUNDANT GRAM NEGATIVE RODS ABUNDANT GRAM POSITIVE RODS    Culture   Final    TOO YOUNG TO READ Performed at [facility], [address], [place], [place] [address]    Report Status PENDING  Incomplete  Blood Cultures x 2 sites     Status: None (Preliminary result)   Collection Time: 03/07/18  8:52 PM  Result Value Ref Range Status   Specimen Description BLOOD RIGHT ARM  Final   Special Requests   Final    BOTTLES DRAWN AEROBIC AND ANAEROBIC Blood Culture adequate volume   Culture   Final    NO GROWTH < 12 HOURS Performed at [facility], [address] [place], [place] [address]    Report Status PENDING  Incomplete    RADIOLOGY STUDIES/RESULTS: Dg Foot Complete Left  Result Date: 03/07/2018 CLINICAL DATA:  Foot infection. EXAM: LEFT FOOT - COMPLETE 3+ VIEW COMPARISON:  Radiographs of January 13, 2018. FINDINGS: Status post surgical amputation at the level of the  proximal metatarsals. No lytic destruction is seen at this time to suggest acute osteomyelitis. No radiopaque foreign body is noted. Dorsal soft tissue swelling is noted which may be postoperative in etiology. IMPRESSION: Postsurgical changes as described above. No definite lytic destruction is seen to suggest osteomyelitis. Electronically Signed   By: Lupita Raider, M.D.   On: 03/07/2018 19:24     LOS: 2 days   Jeoffrey Massed, MD  [facility] Pager:[phone]  If 7PM-7AM, please contact night-coverage [email or web address] Password [facility] 03/09/2018, 11:48 AM

## 2018-03-09 NOTE — Clinical Social Work Note (Signed)
Clinical Social Work Assessment  Patient Details  Name: Mark Graham MRN: [id number] Date of Birth: [birth year]  Date of referral:  03/09/18               Reason for consult:  Facility Placement                Permission sought to share information with:  Facility Sport and exercise psychologist, Family Supports Permission granted to share information::  Yes, Verbal Permission Granted  Name::     Medical laboratory scientific officer::  SNF  Relationship::  Sister  Contact Information:     Housing/Transportation Living arrangements for the past 2 months:  Apartment Source of Information:  Patient Patient Interpreter Needed:  None Criminal Activity/Legal Involvement Pertinent to Current Situation/Hospitalization:  No - Comment as needed Significant Relationships:  Siblings Lives with:  Self Do you feel safe going back to the place where you live?  Yes Need for family participation in patient care:  No (Coment)  Care giving concerns:  Patient lives at home alone, and is supposed to be non-weight bearing on his foot. Patient will benefit from short term rehab in order to heal his foot wound appropriately, but patient has no insurance. Patient was recently placed at [facility] under [facility] in February for the same.   Social Worker assessment / plan:  CSW met with patient to discuss discharge plan. CSW reviewed concerns with the patient, about him living home alone and will need someone with him 24 hours a day to make sure he isn't putting weight on his foot like he's supposed to. CSW discussed with the patient how his SNF options will be limited due to not having insurance. CSW received permission to look into potential placement for the patient.  Employment status:  Unemployed Forensic scientist:  Self Pay (Medicaid Pending) PT Recommendations:  [place] / Referral to community resources:  [place]  Patient/Family's Response to care:  Patient is agreeable to SNF, but would  rather be able to go home; he is going to see if he can stay with his sister for a while at discharge, as well.  Patient/Family's Understanding of and Emotional Response to Diagnosis, Current Treatment, and Prognosis:  Patient discussed how he knew that he needed some additional help when he discharges, because he wants his foot to heal and get better. Patient said he'd look into SNF options, but wondered if it would also be ok if he went to stay with his sister for a while and have her help him with things. Patient will call and talk with his sister about staying with her while he gets better, so she can help him out. Patient began discussing tangential topics during discussion, including the next coming of Christ and how miracles are happening in the streets including people rising from the dead. Patient warned CSW that she needed to prepare for the end times.  Emotional Assessment Appearance:  Appears stated age, Disheveled Attitude/Demeanor/Rapport:  Engaged Affect (typically observed):  Pleasant Orientation:  Oriented to Self, Oriented to Place, Oriented to  Time, Oriented to Situation Alcohol / Substance use:  Not Applicable Psych involvement (Current and /or in the community):  No (Comment)  Discharge Needs  Concerns to be addressed:  Care Coordination Readmission within the last 30 days:  No Current discharge risk:  Physical Impairment, Dependent with Mobility, Lives alone, Lack of support system Barriers to Discharge:  Continued Medical Work up, Inadequate or no insurance   [place],  LCSW 03/09/2018, 1:13 PM

## 2018-03-10 DIAGNOSIS — T8131XA Disruption of external operation (surgical) wound, not elsewhere classified, initial encounter: Secondary | ICD-10-CM

## 2018-03-10 LAB — AEROBIC CULTURE W GRAM STAIN (SUPERFICIAL SPECIMEN)

## 2018-03-10 LAB — AEROBIC CULTURE  (SUPERFICIAL SPECIMEN)

## 2018-03-10 LAB — GLUCOSE, CAPILLARY
GLUCOSE-CAPILLARY: 110 mg/dL — AB (ref 65–99)
Glucose-Capillary: 106 mg/dL — ABNORMAL HIGH (ref 65–99)
Glucose-Capillary: 156 mg/dL — ABNORMAL HIGH (ref 65–99)
Glucose-Capillary: 101 mg/dL — ABNORMAL HIGH (ref 65–99)

## 2018-03-10 MED ORDER — DOXYCYCLINE HYCLATE 100 MG PO TABS
100.0000 mg | ORAL_TABLET | Freq: Two times a day (BID) | ORAL | Status: DC
  Administered 2018-03-10 – 2018-03-11 (×2): 100 mg via ORAL
  Filled 2018-03-10 (×2): qty 1

## 2018-03-10 NOTE — Progress Notes (Signed)
PROGRESS NOTE        PATIENT DETAILS Name: Mark Graham Age: 62 y.o. Sex: male Date of Birth: Apr 26, 1956 Admit Date: 03/07/2018 Admitting Physician Lorretta Harp, MD RUE:[id number], Odette Horns, MD  Brief Narrative: Patient is a 62 y.o. 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 Lajoyce Corners, he was supposed to be nonweightbearing-but unfortunately continue to bear weight-sent from his PCPs office for dehiscence of his transmetatarsal stump.  Subjective: No major issues overnight-lying comfortably-no chest pain or shortness of breath.  Assessment/Plan: Left transmetatarsal stump dehiscence: No signs of infection apparent-removed dressing today-mostly dry without any purulence.  Remains on IV vancomycin-since all cultures continue to be negative-and no obvious infection apparent-we will transition to doxycycline today.  Had spoken with Dr. Lajoyce Corners on 5/14-who recommended 2 weeks of antimicrobial therapy Dr. Lajoyce Corners did not recommend any further imaging studies including MRI.  Patient unfortunately was noncompliant outpatient follow-up, he was also noncompliant to nonweightbearing status-he has been evaluated by physical therapy services with recommendations to transition to SNF on discharge.  Acute kidney injury: Resolved-likely hemodynamically mediated.   Hypertension: Controlled with amlodipine   Dyslipidemia: Continue statin  DM-2: Controlled CBG stable-continue SSI-resume metformin on discharge  Tobacco abuse: Counseled  Noncompliance to follow-up and non-weightbearing status: Counseled.  DVT Prophylaxis: Prophylactic Lovenox   Code Status: Full code   Family Communication: None at bedside  Disposition Plan: Remain inpatient-SNF on discharge when bed available  Antimicrobial agents: Anti-infectives (From  admission, onward)   Start     Dose/Rate Route Frequency Ordered Stop   03/08/18 1000  vancomycin (VANCOCIN) IVPB 750 mg/150 ml premix     750 mg 150 mL/hr over 60 Minutes Intravenous Every 12 hours 03/07/18 2123     03/07/18 2130  vancomycin (VANCOCIN) 1,500 mg in sodium chloride 0.9 % 500 mL IVPB     1,500 mg 250 mL/hr over 120 Minutes Intravenous  Once 03/07/18 2123 03/08/18 0014      Procedures: None  CONSULTS:  None  Time spent: 25- minutes-Greater than 50% of this time was spent in counseling, explanation of diagnosis, planning of further management, and coordination of care.  MEDICATIONS: Scheduled Meds: . amLODipine  5 mg Oral Daily  . atorvastatin  20 mg Oral q1800  . enoxaparin (LOVENOX) injection  40 mg Subcutaneous Daily  . feeding supplement (PRO-STAT SUGAR FREE 64)  30 mL Oral BID  . insulin aspart  0-5 Units Subcutaneous QHS  . insulin aspart  0-9 Units Subcutaneous TID WC  . nicotine  21 mg Transdermal Daily  . QUEtiapine  50 mg Oral QHS  . silver sulfADIAZINE   Topical Daily   Continuous Infusions: . sodium chloride 10 mL/hr at 03/09/18 2057  . sodium chloride    . vancomycin Stopped (03/09/18 2152)   PRN Meds:.acetaminophen, docusate sodium, hydrALAZINE, HYDROcodone-acetaminophen, ondansetron **OR** ondansetron (ZOFRAN) IV, zolpidem   PHYSICAL EXAM: Vital signs: Vitals:   03/09/18 0412 03/09/18 1406 03/09/18 1951 03/10/18 0340  BP: (!) 152/90 (!) 141/90 (!) 151/88 130/87  Pulse: 81 79 73 62  Resp: Temp: 98 F (36.7 C) (!) 97.5 F (36.4 C) 98.2 F (36.8 C) 97.6 F (36.4 C)  TempSrc: Oral Oral Oral Axillary  SpO2: 97% 96% 96% 93%  Weight:      Height:       Filed Weights   03/08/18 1151  Weight: 69.9 kg (154 lb)   Body mass index is 21.48 kg/m.   General appearance :Awake, alert, not in any distress.  Eyes:, pupils equally reactive to light and accomodation,no scleral icterus. HEENT: Atraumatic and Normocephalic Neck:  supple, no JVD. Resp:Good air entry bilaterally, no rales or rhonchi CVS: S1 S2 regular, no murmurs.  GI: Bowel sounds present, Non tender and not distended with no gaurding, rigidity or rebound. Extremities: Left foot: DMS stump-open but without any purulent discharge.  No surrounding erythema.  Nontender.   Neurology:  speech clear,Non focal, sensation is grossly intact. Psychiatric: Normal judgment and insight. Normal mood. Musculoskeletal:No digital cyanosis Skin:No Rash, warm and dry Wounds:N/A  I have personally reviewed following labs and imaging studies  LABORATORY DATA: CBC: Recent Labs  Lab 03/07/18 1841 03/08/18 0101  WBC 10.4 6.5  NEUTROABS 7.1  --   HGB 14.5 13.1  HCT 44.2 40.3  MCV 99.3 98.5  PLT 307 264    Basic Metabolic Panel: Recent Labs  Lab 03/07/18 1841 03/08/18 0101  NA 139 141  K 4.1 3.8  CL 100* 105  CO2 29 28  GLUCOSE 190* 170*  BUN 15 13  CREATININE 1.24 1.12  CALCIUM 9.4 8.9    GFR: Estimated Creatinine Clearance: 68.5 mL/min (by C-G formula based on SCr of 1.12 mg/dL).  Liver Function Tests: Recent Labs  Lab 03/07/18 1841  AST 21  ALT 13*  ALKPHOS 99  BILITOT 0.6  PROT 7.1  ALBUMIN 3.9   No results for input(s): LIPASE, AMYLASE in the last 168 hours. No results for input(s): AMMONIA in the last 168 hours.  Coagulation Profile: Recent Labs  Lab 03/08/18 0101  INR 0.99    Cardiac Enzymes: No results for input(s): CKTOTAL, CKMB, CKMBINDEX, TROPONINI in the last 168 hours.  BNP (last 3 results) No results for input(s): PROBNP in the last 8760 hours.  HbA1C: Recent Labs    03/07/18 1434  HGBA1C 6.5    CBG: Recent Labs  Lab 03/09/18 0622 03/09/18 1126 03/09/18 1718 03/09/18 2110 03/10/18 0631  GLUCAP 104* 135* 136* 106* 106*    Lipid Profile: No results for input(s): CHOL, HDL, LDLCALC, TRIG, CHOLHDL, LDLDIRECT in the last 72 hours.  Thyroid Function Tests: No results for input(s): TSH, T4TOTAL,  FREET4, T3FREE, THYROIDAB in the last 72 hours.  Anemia Panel: No results for input(s): VITAMINB12, FOLATE, FERRITIN, TIBC, IRON, RETICCTPCT in the last 72 hours.  Urine analysis:    Component Value Date/Time   COLORURINE YELLOW 01/13/2018 0041   APPEARANCEUR HAZY (A) 01/13/2018 0041   LABSPEC 1.005 01/13/2018 0041   PHURINE 6.0 01/13/2018 0041   GLUCOSEU NEGATIVE 01/13/2018 0041   HGBUR SMALL (A) 01/13/2018 0041   BILIRUBINUR NEGATIVE 01/13/2018 0041   KETONESUR NEGATIVE 01/13/2018 0041   PROTEINUR NEGATIVE 01/13/2018 0041   UROBILINOGEN 1.0 01/15/2015 1946   NITRITE NEGATIVE 01/13/2018 0041   LEUKOCYTESUR LARGE (A) 01/13/2018 0041    Sepsis Labs: Lactic Acid, Venous    Component Value Date/Time   LATICACIDVEN 1.4 03/08/2018 0101    MICROBIOLOGY: Recent Results (from the past 240 hour(s))  Wound or Superficial Culture     Status: None (Preliminary result)   Collection Time: 03/07/18  8:30 PM  Result Value Ref Range Status   Specimen Description WOUND LEFT FOOT  Final   Special Requests NONE  Final   Gram Stain  Final    ABUNDANT WBC PRESENT, PREDOMINANTLY PMN ABUNDANT GRAM POSITIVE COCCI ABUNDANT GRAM NEGATIVE RODS ABUNDANT GRAM POSITIVE RODS    Culture   Final    CULTURE REINCUBATED FOR BETTER GROWTH Performed at [facility], [address] [place], [place] [address]    Report Status PENDING  Incomplete  Blood Cultures x 2 sites     Status: None (Preliminary result)   Collection Time: 03/07/18  8:52 PM  Result Value Ref Range Status   Specimen Description BLOOD RIGHT ARM  Final   Special Requests   Final    BOTTLES DRAWN AEROBIC AND ANAEROBIC Blood Culture adequate volume   Culture   Final    NO GROWTH 2 DAYS Performed at [facility], [address] [place], [place] [address]    Report Status PENDING  Incomplete  Blood Cultures x 2 sites     Status: None (Preliminary result)   Collection Time: 03/07/18 10:10 PM  Result Value Ref Range  Status   Specimen Description BLOOD LEFT ARM  Final   Special Requests   Final    BOTTLES DRAWN AEROBIC AND ANAEROBIC Blood Culture adequate volume   Culture   Final    NO GROWTH 1 DAY Performed at [facility], [address] [place], [place] [address]    Report Status PENDING  Incomplete    RADIOLOGY STUDIES/RESULTS: Dg Foot Complete Left  Result Date: 03/07/2018 CLINICAL DATA:  Foot infection. EXAM: LEFT FOOT - COMPLETE 3+ VIEW COMPARISON:  Radiographs of January 13, 2018. FINDINGS: Status post surgical amputation at the level of the proximal metatarsals. No lytic destruction is seen at this time to suggest acute osteomyelitis. No radiopaque foreign body is noted. Dorsal soft tissue swelling is noted which may be postoperative in etiology. IMPRESSION: Postsurgical changes as described above. No definite lytic destruction is seen to suggest osteomyelitis. Electronically Signed   By: Lupita Raider, M.D.   On: 03/07/2018 19:24     LOS: 3 days   Jeoffrey Massed, MD  [facility] Pager:[phone]  If 7PM-7AM, please contact night-coverage [email or web address] Password [email or web address] 03/10/2018, 10:31 AM

## 2018-03-10 NOTE — Progress Notes (Signed)
Physical Therapy Treatment Patient Details Name: Mark Graham MRN: [id number] DOB: 1955-11-19 Today's Date: 03/10/2018    History of Present Illness Pt is a 62 y/o male admitted secondary to dehiscence of L transmetatarsal amputation. PMH includes DM, HTN, tobacco abuse, and s/p L transmetatarsal amputation.     PT Comments    Pt performed gait training and transfer activities.  Pt able to maintain weight bearing throughout session.  Pt remains to require cues for safety and to maintain focus on task.  Pt remains undecided on SNF due to price.  At this time short term SNF stay remains appropriate to improve strength and function before returning home.    Follow Up Recommendations  SNF;Supervision/Assistance - 24 hour     Equipment Recommendations  Rolling walker with 5" wheels(able to fix pin to stabilize RW but R side remains to appeared altered and slightly bent.  )    Recommendations for Other Services       Precautions / Restrictions Precautions Precautions: Fall Restrictions Weight Bearing Restrictions: Yes LLE Weight Bearing: Non weight bearing    Mobility  Bed Mobility Overal bed mobility: Needs Assistance Bed Mobility: Supine to Sit     Supine to sit: Supervision     General bed mobility comments: Supervision for safety. Required safety cues to wait for PT to perform mobility tasks.   Transfers Overall transfer level: Needs assistance Equipment used: Rolling walker (2 wheeled) Transfers: Sit to/from Stand Sit to Stand: Supervision         General transfer comment: Cues for hand placement to and from seated surface.  Pt with better carryover maintaining weight bearing throughout.    Ambulation/Gait Ambulation/Gait assistance: Min guard Ambulation Distance (Feet): 40 Feet(x2 trials.  Seated rest break between trials.  ) Assistive device: Rolling walker (2 wheeled) Gait Pattern/deviations: Step-to pattern Gait velocity: Decreased    General Gait Details:  Hop to pattern during gait. Min to min guard for safety. Required safety cues to stay on task during ambulation, as pt easily distractible. Safety cues for safe use of RW during gait.    Stairs Stairs: Yes Stairs assistance: Min assist Stair Management: No rails;Backwards;With walker Number of Stairs: 2 General stair comments: Cues for sequencing and RW placement.  Pt required PTA to stabilize RW to allow for UE support to ascend and descend stairs.     Wheelchair Mobility    Modified Rankin (Stroke Patients Only)       Balance     Sitting balance-Leahy Scale: Good       Standing balance-Leahy Scale: Fair                              Cognition Arousal/Alertness: Awake/alert Behavior During Therapy: Impulsive Overall Cognitive Status: Within Functional Limits for tasks assessed                                 General Comments: Pt remains very distractable reciting biblical quotes and sharing insight on building.        Exercises      General Comments        Pertinent Vitals/Pain Pain Assessment: No/denies pain    Home Living                      Prior Function  PT Goals (current goals can now be found in the care plan section) Acute Rehab PT Goals Patient Stated Goal: to go home  Potential to Achieve Goals: Good Progress towards PT goals: Progressing toward goals    Frequency    Min 2X/week      PT Plan Current plan remains appropriate    Co-evaluation              AM-PAC PT "6 Clicks" Daily Activity  Outcome Measure  Difficulty turning over in bed (including adjusting bedclothes, sheets and blankets)?: None Difficulty moving from lying on back to sitting on the side of the bed? : None Difficulty sitting down on and standing up from a chair with arms (e.g., wheelchair, bedside commode, etc,.)?: A Little Help needed moving to and from a bed to chair (including a wheelchair)?: A Little Help  needed walking in hospital room?: A Little Help needed climbing 3-5 steps with a railing? : A Little 6 Click Score: 20    End of Session Equipment Utilized During Treatment: Gait belt Activity Tolerance: Patient tolerated treatment well Patient left: in chair;with call bell/phone within reach Nurse Communication: Mobility status PT Visit Diagnosis: Other abnormalities of gait and mobility (R26.89);Difficulty in walking, not elsewhere classified (R26.2)     Time: 1610-9604 PT Time Calculation (min) (ACUTE ONLY): 25 min  Charges:  $Gait Training: 8-22 mins $Therapeutic Activity: 8-22 mins                    G Codes:       Joycelyn Rua, PTA pager [phone]    Florestine Avers 03/10/2018, 1:51 PM

## 2018-03-11 DIAGNOSIS — E118 Type 2 diabetes mellitus with unspecified complications: Secondary | ICD-10-CM

## 2018-03-11 DIAGNOSIS — N179 Acute kidney failure, unspecified: Secondary | ICD-10-CM

## 2018-03-11 DIAGNOSIS — I10 Essential (primary) hypertension: Secondary | ICD-10-CM

## 2018-03-11 LAB — GLUCOSE, CAPILLARY: GLUCOSE-CAPILLARY: 90 mg/dL (ref 65–99)

## 2018-03-11 MED ORDER — AMOXICILLIN-POT CLAVULANATE 875-125 MG PO TABS: 1.0000 | ORAL_TABLET | Freq: Two times a day (BID) | ORAL | Status: DC

## 2018-03-11 MED ORDER — HYDROCODONE-ACETAMINOPHEN 5-325 MG PO TABS: 1.0000 | ORAL_TABLET | Freq: Four times a day (QID) | ORAL | 0 refills | Status: DC | PRN

## 2018-03-11 MED ORDER — LISINOPRIL 10 MG PO TABS: 10.0000 mg | ORAL_TABLET | Freq: Every day | ORAL | 0 refills | Status: DC

## 2018-03-11 MED ORDER — SILVER SULFADIAZINE 1 % EX CREA: TOPICAL_CREAM | Freq: Every day | CUTANEOUS | 0 refills | Status: DC

## 2018-03-11 MED ORDER — PRO-STAT SUGAR FREE PO LIQD: 30.0000 mL | Freq: Two times a day (BID) | ORAL | 0 refills | Status: DC

## 2018-03-11 MED ORDER — DOXYCYCLINE HYCLATE 100 MG PO TABS: 100.0000 mg | ORAL_TABLET | Freq: Two times a day (BID) | ORAL | 0 refills | Status: DC

## 2018-03-11 MED ORDER — METFORMIN HCL 500 MG PO TABS: 500.0000 mg | ORAL_TABLET | Freq: Two times a day (BID) | ORAL | 0 refills | Status: DC

## 2018-03-11 MED ORDER — AMOXICILLIN-POT CLAVULANATE 875-125 MG PO TABS: 1.0000 | ORAL_TABLET | Freq: Two times a day (BID) | ORAL | 0 refills | Status: DC

## 2018-03-11 MED ORDER — QUETIAPINE FUMARATE 50 MG PO TABS: 50.0000 mg | ORAL_TABLET | Freq: Every day | ORAL | 0 refills | Status: DC

## 2018-03-11 MED ORDER — ATORVASTATIN CALCIUM 20 MG PO TABS: 20.0000 mg | ORAL_TABLET | Freq: Every day | ORAL | 0 refills | Status: DC

## 2018-03-11 NOTE — Progress Notes (Signed)
Pt requests to leave AMA, RFA peripheral IV removed, intact. Pt tol well.  [id number].

## 2018-03-11 NOTE — Social Work (Signed)
CSW confirmed SNF bed with admissions for [facility] with 30 day LOG and rehabilitative therapies.   CSW met with patient and he indicated that he declined to go to SNF and prefers to go home. CSW advised doctor and floor RN to assist with discharge.  CSW signing off for now.  Elissa Hefty, LCSW Clinical Social Worker [phone]

## 2018-03-11 NOTE — Discharge Summary (Signed)
PATIENT DETAILS Name: Mark Graham Age: 62 y.o. Sex: male Date of Birth: 1956-03-07 MRN: [id number]. Admitting Physician: Lorretta Harp, MD [id number], Odette Horns, MD  Admit Date: 03/07/2018 Discharge date: 03/11/2018  Recommendations for Outpatient Follow-up:  1. Follow up with PCP in 1-2 weeks 2. Please obtain BMP/CBC in one week 3. Please ensure follow-up with Dr. Lajoyce Corners in 1 week   Admitted From:  Home  Disposition: SNF   Home Health: No  Equipment/Devices: None  Discharge Condition: Stable  CODE STATUS: FULL CODE  Diet recommendation:  Heart Healthy / Carb Modified   Brief Summary: See H&P, Labs, Consult and Test reports for all details in brief, Patient is a 62 y.o. 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 Lajoyce Corners, he was supposed to be nonweightbearing-but unfortunately continue to bear weight-sent from his PCPs office for dehiscence of his transmetatarsal stump.  Brief Hospital Course: Left transmetatarsal stump dehiscence: No signs of infection apparent-on exam-has dehiscence of the wound in the middle aspect of his stump-no purulent discharge.  Initially started on IV vancomycin-however all cultures are negative.  Spoke with Dr. Lajoyce Corners on 5/14-who recommends 2 weeks of antimicrobial therapy, recommends that the patient be completely nonweightbearing to the left lower extremity.  He did not advise any imaging studies including MRI.  Patient was counseled not to smoke-to be compliant with weightbearing measures-and to follow-up with Dr. Lajoyce Corners within 1 week of discharge.  Patient will be placed on 10 more days of empiric antimicrobial therapy.  Patient was evaluated by physical therapy services and recommended to be discharged to SNF.  Acute kidney injury: Resolved-likely hemodynamically mediated.   Hypertension:  Controlled with lisinopril   Dyslipidemia: Continue statin  DM-2: Controlled CBG stable-continue SSI-resume metformin on discharge  Tobacco abuse: Counseled  Noncompliance to follow-up and non-weightbearing status: Counseled.  Procedures/Studies: None  Discharge Diagnoses:  Principal Problem:   Wound infection after surgery Active Problems:   Hypertension   Tobacco abuse   Diabetes mellitus type 2, controlled, with complications (HCC)   Depression   S/P transmetatarsal amputation of foot, left (HCC)   HLD (hyperlipidemia)   Left foot infection   AKI (acute kidney injury) [facility])   Discharge Instructions:  Activity:  Nonweightbearing to the lower left extremity  Discharge Instructions    Call MD for:  redness, tenderness, or signs of infection (pain, swelling, redness, odor or green/yellow discharge around incision site)   Complete by:  As directed    Call MD for:  temperature >100.4   Complete by:  As directed    Diet - low sodium heart healthy   Complete by:  As directed    Discharge instructions   Complete by:  As directed    Follow with Primary MD  Hoy Register, MD IN 1 WEEK  Follow with Orthopedics-Dr Lajoyce Corners in 1 week  Please get a complete blood count and chemistry panel checked by your Primary MD at your next visit, and again as instructed by your Primary MD.  Get Medicines reviewed and adjusted: Please take all your medications with you for your next visit with your Primary MD  Laboratory/radiological data: Please request your Primary MD to go over all hospital tests and procedure/radiological results at the follow up, please ask your Primary MD to get all Hospital records sent to his/her office.  In some cases, they will be blood work, cultures and biopsy results  pending at the time of your discharge. Please request that your primary care M.D. follows up on these results.  Also Note the following: If you experience worsening of your admission  symptoms, develop shortness of breath, life threatening emergency, suicidal or homicidal thoughts you must seek medical attention immediately by calling 911 or calling your MD immediately  if symptoms less severe.  You must read complete instructions/literature along with all the possible adverse reactions/side effects for all the Medicines you take and that have been prescribed to you. Take any new Medicines after you have completely understood and accpet all the possible adverse reactions/side effects.   Do not drive when taking Pain medications or sleeping medications (Benzodaizepines)  Do not take more than prescribed Pain, Sleep and Anxiety Medications. It is not advisable to combine anxiety,sleep and pain medications without talking with your primary care practitioner  Special Instructions: If you have smoked or chewed Tobacco  in the last 2 yrs please stop smoking, stop any regular Alcohol  and or any Recreational drug use.  Wear Seat belts while driving.  Please note: You were cared for by a hospitalist during your hospital stay. Once you are discharged, your primary care physician will handle any further medical issues. Please note that NO REFILLS for any discharge medications will be authorized once you are discharged, as it is imperative that you return to your primary care physician (or establish a relationship with a primary care physician if you do not have one) for your post hospital discharge needs so that they can reassess your need for medications and monitor your lab values.   Discharge wound care:   Complete by:  As directed    Wash left foot with soap and water apply Silvadene plus dry dressing change daily.   Increase activity slowly   Complete by:  As directed      Allergies as of 03/11/2018   No Known Allergies     Medication List    TAKE these medications   acetaminophen 325 MG tablet Commonly known as:  TYLENOL Take 1-2 tablets (325-650 mg total) by mouth every  6 (six) hours as needed for mild pain (pain score 1-3 or temp > 100.5).   amoxicillin-clavulanate 875-125 MG tablet Commonly known as:  AUGMENTIN Take 1 tablet by mouth every 12 (twelve) hours.   atorvastatin 20 MG tablet Commonly known as:  LIPITOR Take 1 tablet (20 mg total) by mouth daily at 6 PM.   bisacodyl 10 MG suppository Commonly known as:  DULCOLAX Place 1 suppository (10 mg total) rectally daily as needed for moderate constipation.   docusate sodium 100 MG capsule Commonly known as:  COLACE Take 1 capsule (100 mg total) by mouth 2 (two) times daily.   doxycycline 100 MG tablet Commonly known as:  VIBRA-TABS Take 1 tablet (100 mg total) by mouth every 12 (twelve) hours.   feeding supplement (PRO-STAT SUGAR FREE 64) Liqd Take 30 mLs by mouth 2 (two) times daily.   HYDROcodone-acetaminophen 5-325 MG tablet Commonly known as:  NORCO/VICODIN Take 1 tablet by mouth every 6 (six) hours as needed for moderate pain (pain score 4-6). What changed:  when to take this   lisinopril 10 MG tablet Commonly known as:  PRINIVIL,ZESTRIL Take 1 tablet (10 mg total) by mouth daily.   metFORMIN 500 MG tablet Commonly known as:  GLUCOPHAGE Take 1 tablet (500 mg total) by mouth 2 (two) times daily with a meal.   polyethylene glycol packet Commonly known as:  MIRALAX Take 17 g by mouth daily as needed for mild constipation.   QUEtiapine 50 MG tablet Commonly known as:  SEROQUEL Take 1 tablet (50 mg total) by mouth at bedtime.   silver sulfADIAZINE 1 % cream Commonly known as:  SILVADENE Apply topically daily. Wash left foot with soap and water apply Silvadene plus dry dressing change daily. Start taking on:  03/12/2018   TRUE METRIX METER Devi 1 each by Does not apply route daily.            Discharge Care Instructions  (From admission, onward)        Start     Ordered   03/11/18 0000  Discharge wound care:    Comments:  Wash left foot with soap and water apply  Silvadene plus dry dressing change daily.   03/11/18 4098     Follow-up Information    Nadara Mustard, MD Follow up in 1 week(s).   Specialty:  Orthopedic Surgery Contact information: [address] [place] [address] [phone]        Hoy Register, MD. Schedule an appointment as soon as possible for a visit in 1 week(s).   Specialty:  Family Medicine Contact information: [address] [place] [address] [phone]          No Known Allergies  Consultations:   orthopedic surgery  Other Procedures/Studies: Dg Foot Complete Left  Result Date: 03/07/2018 CLINICAL DATA:  Foot infection. EXAM: LEFT FOOT - COMPLETE 3+ VIEW COMPARISON:  Radiographs of January 13, 2018. FINDINGS: Status post surgical amputation at the level of the proximal metatarsals. No lytic destruction is seen at this time to suggest acute osteomyelitis. No radiopaque foreign body is noted. Dorsal soft tissue swelling is noted which may be postoperative in etiology. IMPRESSION: Postsurgical changes as described above. No definite lytic destruction is seen to suggest osteomyelitis. Electronically Signed   By: Lupita Raider, M.D.   On: 03/07/2018 19:24      TODAY-DAY OF DISCHARGE:  Subjective:   Mark Graham today has no headache,no chest abdominal pain,no new weakness tingling or numbness, feels much better wants to go home today.   Objective:   Blood pressure (!) 150/90, pulse 78, temperature (!) 97.5 F (36.4 C), temperature source Oral, resp. rate 16, height  (1.803 m), weight 69.9 kg (154 lb), SpO2 97 %.  Intake/Output Summary (Last 24 hours) at 03/11/2018 0929 Last data filed at 03/11/2018 0649 Gross per 24 hour  Intake 480 ml  Output 3000 ml  Net -2520 ml   Filed Weights   03/08/18 1151  Weight: 69.9 kg (154 lb)    Exam: Awake Alert, Oriented *3, No new F.N deficits, Normal affect [place].AT,PERRAL Supple Neck,No JVD, No cervical lymphadenopathy appriciated.   Symmetrical Chest wall movement, Good air movement bilaterally, CTAB RRR,No Gallops,Rubs or new Murmurs, No Parasternal Heave +ve B.Sounds, Abd Soft, Non tender, No organomegaly appriciated, No rebound -guarding or rigidity. No Cyanosis, Clubbing or edema, No new Rash or bruise   PERTINENT RADIOLOGIC STUDIES: Dg Foot Complete Left  Result Date: 03/07/2018 CLINICAL DATA:  Foot infection. EXAM: LEFT FOOT - COMPLETE 3+ VIEW COMPARISON:  Radiographs of January 13, 2018. FINDINGS: Status post surgical amputation at the level of the proximal metatarsals. No lytic destruction is seen at this time to suggest acute osteomyelitis. No radiopaque foreign body is noted. Dorsal soft tissue swelling is noted which may be postoperative in etiology. IMPRESSION: Postsurgical changes as described above. No definite lytic  destruction is seen to suggest osteomyelitis. Electronically Signed   By: Lupita Raider, M.D.   On: 03/07/2018 19:24     PERTINENT LAB RESULTS: CBC: No results for input(s): WBC, HGB, HCT, PLT in the last 72 hours. CMET CMP     Component Value Date/Time   NA 141 03/08/2018 0101   NA 137 02/16/2017 1430   K 3.8 03/08/2018 0101   CL 105 03/08/2018 0101   CO2 28 03/08/2018 0101   GLUCOSE 170 (H) 03/08/2018 0101   BUN 13 03/08/2018 0101   BUN 8 02/16/2017 1430   CREATININE 1.12 03/08/2018 0101   CREATININE 0.83 12/16/2016 0949   CALCIUM 8.9 03/08/2018 0101   PROT 7.1 03/07/2018 1841   PROT 6.6 02/16/2017 1430   ALBUMIN 3.9 03/07/2018 1841   ALBUMIN 3.9 02/16/2017 1430   AST 21 03/07/2018 1841   ALT 13 (L) 03/07/2018 1841   ALKPHOS 99 03/07/2018 1841   BILITOT 0.6 03/07/2018 1841   BILITOT <0.2 02/16/2017 1430   GFRNONAA >60 03/08/2018 0101   GFRAA >60 03/08/2018 0101    GFR Estimated Creatinine Clearance: 68.5 mL/min (by C-G formula based on SCr of 1.12 mg/dL). No results for input(s): LIPASE, AMYLASE in the last 72 hours. No results for input(s): CKTOTAL, CKMB, CKMBINDEX,  TROPONINI in the last 72 hours. Invalid input(s): POCBNP No results for input(s): DDIMER in the last 72 hours. No results for input(s): HGBA1C in the last 72 hours. No results for input(s): CHOL, HDL, LDLCALC, TRIG, CHOLHDL, LDLDIRECT in the last 72 hours. No results for input(s): TSH, T4TOTAL, T3FREE, THYROIDAB in the last 72 hours.  Invalid input(s): FREET3 No results for input(s): VITAMINB12, FOLATE, FERRITIN, TIBC, IRON, RETICCTPCT in the last 72 hours. Coags: No results for input(s): INR in the last 72 hours.  Invalid input(s): PT Microbiology: Recent Results (from the past 240 hour(s))  Wound or Superficial Culture     Status: Abnormal   Collection Time: 03/07/18  8:30 PM  Result Value Ref Range Status   Specimen Description WOUND LEFT FOOT  Final   Special Requests NONE  Final   Gram Stain   Final    ABUNDANT WBC PRESENT, PREDOMINANTLY PMN ABUNDANT GRAM POSITIVE COCCI ABUNDANT GRAM NEGATIVE RODS ABUNDANT GRAM POSITIVE RODS Performed at [facility], [address], [place], [place] [address]    Culture MULTIPLE ORGANISMS PRESENT, NONE PREDOMINANT (A)  Final   Report Status 03/10/2018 FINAL  Final  Blood Cultures x 2 sites     Status: None (Preliminary result)   Collection Time: 03/07/18  8:52 PM  Result Value Ref Range Status   Specimen Description BLOOD RIGHT ARM  Final   Special Requests   Final    BOTTLES DRAWN AEROBIC AND ANAEROBIC Blood Culture adequate volume   Culture   Final    NO GROWTH 3 DAYS Performed at [facility], [address] [place], [place] [address]    Report Status PENDING  Incomplete  Blood Cultures x 2 sites     Status: None (Preliminary result)   Collection Time: 03/07/18 10:10 PM  Result Value Ref Range Status   Specimen Description BLOOD LEFT ARM  Final   Special Requests   Final    BOTTLES DRAWN AEROBIC AND ANAEROBIC Blood Culture adequate volume   Culture   Final    NO GROWTH 2 DAYS Performed at [facility],  [address] [place], [place] [address]    Report Status PENDING  Incomplete    FURTHER DISCHARGE INSTRUCTIONS:  Get Medicines reviewed and adjusted: Please take all your medications with you for your next visit with your Primary MD  Laboratory/radiological data: Please request your Primary MD to go over all hospital tests and procedure/radiological results at the follow up, please ask your Primary MD to get all Hospital records sent to his/her office.  In some cases, they will be blood work, cultures and biopsy results pending at the time of your discharge. Please request that your primary care M.D. goes through all the records of your hospital data and follows up on these results.  Also Note the following: If you experience worsening of your admission symptoms, develop shortness of breath, life threatening emergency, suicidal or homicidal thoughts you must seek medical attention immediately by calling 911 or calling your MD immediately  if symptoms less severe.  You must read complete instructions/literature along with all the possible adverse reactions/side effects for all the Medicines you take and that have been prescribed to you. Take any new Medicines after you have completely understood and accpet all the possible adverse reactions/side effects.   Do not drive when taking Pain medications or sleeping medications (Benzodaizepines)  Do not take more than prescribed Pain, Sleep and Anxiety Medications. It is not advisable to combine anxiety,sleep and pain medications without talking with your primary care practitioner  Special Instructions: If you have smoked or chewed Tobacco  in the last 2 yrs please stop smoking, stop any regular Alcohol  and or any Recreational drug use.  Wear Seat belts while driving.  Please note: You were cared for by a hospitalist during your hospital stay. Once you are discharged, your primary care physician will handle any further medical issues. Please note  that NO REFILLS for any discharge medications will be authorized once you are discharged, as it is imperative that you return to your primary care physician (or establish a relationship with a primary care physician if you do not have one) for your post hospital discharge needs so that they can reassess your need for medications and monitor your lab values.  Total Time spent coordinating discharge including counseling, education and face to face time equals 35 minutes.  SignedJeoffrey Massed 03/11/2018 9:29 AM

## 2018-03-11 NOTE — Progress Notes (Signed)
Informed by social work that the patient does not want to go to SNF-he now wants to go home-he will now be discharged home at his own request.  We will try and arrange for some home health services.

## 2018-03-11 NOTE — Care Management Note (Signed)
Case Management Note  Patient Details  Name: KWASI JOUNG MRN: [id number] Date of Birth: [birth year]  Subjective/Objective: 62 yr old male s/p left transmetatarsal stump dehiscence.                   Action/Plan: MD asked CM to arrange Home Health services for patient. Patient has already left the building. CM contacted Shaune Leeks, [facility] Lia[facility] with referral. Patient has appointment with [facility] on next week.    Expected Discharge Date:  03/11/18               Expected Discharge Plan:  Home w Home Health Services  In-House Referral:  NA  Discharge planning Services  CM Consult  Post Acute Care Choice:  Home Health Choice offered to:     DME Arranged:  N/A(has RW) DME Agency:  NA  HH Arranged:  PT, Nurse's Aide, RN HH Agency:  [facility]  Status of Service:  Completed, signed off  If discussed at Long Length of Stay Meetings, dates discussed:    Additional Comments:  Durenda Guthrie, RN 03/11/2018, 10:31 AM

## 2018-03-12 LAB — CULTURE, BLOOD (ROUTINE X 2)
CULTURE: NO GROWTH
SPECIAL REQUESTS: ADEQUATE

## 2018-03-13 LAB — CULTURE, BLOOD (ROUTINE X 2)
Culture: NO GROWTH
Special Requests: ADEQUATE

## 2018-03-14 ENCOUNTER — Telehealth: Payer: Self-pay

## 2018-03-14 ENCOUNTER — Other Ambulatory Visit: Payer: Self-pay | Admitting: Family Medicine

## 2018-03-14 ENCOUNTER — Ambulatory Visit: Payer: MEDICAID | Attending: Family Medicine | Admitting: *Deleted

## 2018-03-14 DIAGNOSIS — T8130XS Disruption of wound, unspecified, sequela: Secondary | ICD-10-CM

## 2018-03-14 DIAGNOSIS — T8130XA Disruption of wound, unspecified, initial encounter: Secondary | ICD-10-CM

## 2018-03-14 DIAGNOSIS — Z4800 Encounter for change or removal of nonsurgical wound dressing: Secondary | ICD-10-CM | POA: Insufficient documentation

## 2018-03-14 MED ORDER — GLUCOSE BLOOD VI STRP: ORAL_STRIP | 12 refills | Status: DC

## 2018-03-14 MED ORDER — TRUEPLUS LANCETS 26G MISC: 5 refills | Status: DC

## 2018-03-14 MED FILL — TRUEplus LANCETS 28G MISC: 25 days supply | Qty: 100 | Fill #0

## 2018-03-14 MED FILL — !TRUE METRIX BLOOD GLUCOSE: 365 days supply | Qty: 1 | Fill #0

## 2018-03-14 MED FILL — TRUE METRIX TEST STRIP: 25 days supply | Qty: 100 | Fill #0

## 2018-03-14 NOTE — Telephone Encounter (Signed)
Transitional Care Clinic Post-discharge Follow-Up Phone Call:  The patient stated that he tried to return this CM call but he was having problems with his phone and he decided to come to the office.    Carilyn Goodpasture, RN/Triage [facility] changed his left foot dressing and instructed him regarding the dressing change and provided him with dressing supplies.   Date of Discharge: 03/11/18 - AMA Principal Discharge Diagnosis(es):  Left metatarsal stump dehiscence, DM, HTN Post-discharge Communication: (Clearly document all attempts clearly and date contact made)  - met with patient in person Interpreter Needed: No    Please check all that apply:  X  Patient is knowledgeable of his/her condition(s) and/or treatment.   X  Patient is caring for self at home.  - he stated that he continues to live alone in the " shack" that is owned by his sister -in-law and brother, however his brother is currently in the hospital. He has little support in the community ? Patient is receiving assist at home from family and/or caregiver. Family and/or caregiver is knowledgeable of patient's condition(s) and/or treatment. ? Patient is receiving home health services. If so, name of agency.     Medication Reconciliation:  ? Medication list reviewed with patient. X  Patient obtained all discharge medications. If not, why? - he left AMA and was not given any prescriptions when he left the hospital.  He also noted that he has no medications or a glucometer at home.   This CM spoke to Dr [name] about his lack of medications and she stated that he should pick up the refills that she sent to [facility] pharmacy prior to his recent hospitalization and she will see him at his appointment on 03/18/18. She did not order any other medications at this time. As per Veneda Melter, [place] the patient will be picking up this set up refills and will be able to put the charges on an account to pay off when he is able but he will not be able  to charge any additional medications until he obtains a [employer]. This information was shared with the patient. Deisy Botello, [facility] met with the patient to review the documentation that is needed for the [employer], [place]. She stated that she will be contacting the patient's sister in law to obtain a notarized letter of support.  The patient has an appointment with Financial Counseling on 03/23/2018 @ 1330.      Activities of Daily Living:  X  Independent - he has a protective boot on his left foot and has crutches as he is supposed to be NWB on the left foot.  This CM reminded him of the importance of NWB order.   ? Needs assist (describe; ? home DME used) ? Total [facility] (describe, ? home DME used)   Community resources in place for patient:  X  None  - call placed to [facility], spoke to [facility] who stated that a referral was never received for home health services.  The patient had left the hospital AMA.  He stated that he did not want to go to the SNF because of the charges that he had incurred after his last hospitalization when he was discharged to [facility]. He stated that no one had explained to him that he could possibly be eligible for financial assistance or emergency medicaid while in the facility. Explained to him that he can discuss these concerns with the financial counselor.  ?  Home Health/Home DME ? Assisted Living ? Support Group        Questions/Concerns discussed: this CM provided him with the contact # for Dr Sharol Given to call to schedule a follow up appointment. This CM also provided him with a written reminder of his appointment at [facility] on 03/18/18 @ 1410 and provided him with bus passes to get home today and to return to the clinic on 03/18/18 for his appointment.  The patient had his medication refills when he left the clinic today.   He had no additional questions at this time.

## 2018-03-14 NOTE — Progress Notes (Signed)
Dressing change to left foot.  Pt states he has not had dressing changed since last Thursday or Friday.   Noticed mild odor along with stained  bandage. He states he had the stiches taken out on last week. He has palpable dorsal pedis pulse. Skin color WNL.  He is wearing a orthopedic boot for  protection and walking with crutches.

## 2018-03-14 NOTE — Telephone Encounter (Signed)
Transitional Care Clinic Post-discharge Follow-Up Phone Call:  Date of Discharge:  03/11/2018 - AMA Principal Discharge Diagnosis(es):  Left metatarsal stump dehiscence, DM Post-discharge Communication: (Clearly document all attempts clearly and date contact made) call placed to #[phone] (M) and a HIPAA compliant voicemail message was left requesting a call back to # [phone].  Call Completed: No

## 2018-03-15 ENCOUNTER — Telehealth: Payer: Self-pay | Admitting: Family Medicine

## 2018-03-15 NOTE — Telephone Encounter (Signed)
Call placed to patient #[phone] to help and ask what day and time is best for him to schedule an appointment with specialist Dr. Lajoyce Corners. No answer. Left patient a message asking him to return my call at [phone].

## 2018-03-16 ENCOUNTER — Telehealth: Payer: Self-pay

## 2018-03-16 NOTE — Telephone Encounter (Signed)
Call received from the patient stating he was returning a call from this office. Reminded him that he needs to call Dr Lajoyce Corners to schedule a follow up appointment.  He said that he called the office this morning to find their office location but he didn't schedule an appointment.  Instructed him to schedule the appointment as soon as possible.   Also reminded him that he has an appointment at [facility] on 03/18/18 and reminded him that his sister in law needs to respond to Theadora Rama, [facility] Financial Counselor's requests for information so that he can receive a [employer].  He stated that he understood and then said god bye and hung up.

## 2018-03-17 NOTE — Progress Notes (Signed)
   Pt presents for review of glucometer technique at the request of Dr. Alvis Lemmings. Patient was seen by Dr. Alvis Lemmings today.   Patient denies adherence with checking home sugars. Pt states that he doesn't have a problem with technique. However, he struggles to remember to check home sugars.  A/P: -Extensively discussed/reviewed glucometer technique. Pt demonstrates proper technique when asked to demonstrate.  -Counseled on s/sx of and management of hypoglycemia -Next A1C anticipated ~08/2018  Written patient instructions provided. Pt demonstrates understanding of the plan. Total time in face to face counseling 15 minutes.  Butch Penny, PharmD Clinical Pharmacist  [facility] [phone]

## 2018-03-18 ENCOUNTER — Encounter: Payer: Self-pay | Admitting: Family Medicine

## 2018-03-18 ENCOUNTER — Telehealth: Payer: Self-pay | Admitting: Family Medicine

## 2018-03-18 ENCOUNTER — Encounter: Payer: Self-pay | Admitting: Pharmacist

## 2018-03-18 ENCOUNTER — Ambulatory Visit: Payer: Self-pay | Attending: Family Medicine | Admitting: Family Medicine

## 2018-03-18 ENCOUNTER — Ambulatory Visit (HOSPITAL_BASED_OUTPATIENT_CLINIC_OR_DEPARTMENT_OTHER): Payer: Self-pay | Admitting: Pharmacist

## 2018-03-18 VITALS — BP 114/72 | HR 82 | Temp 97.8°F | Ht 71.0 in | Wt 157.8 lb

## 2018-03-18 DIAGNOSIS — Z7984 Long term (current) use of oral hypoglycemic drugs: Secondary | ICD-10-CM | POA: Insufficient documentation

## 2018-03-18 DIAGNOSIS — R21 Rash and other nonspecific skin eruption: Secondary | ICD-10-CM | POA: Insufficient documentation

## 2018-03-18 DIAGNOSIS — I10 Essential (primary) hypertension: Secondary | ICD-10-CM | POA: Insufficient documentation

## 2018-03-18 DIAGNOSIS — Z79899 Other long term (current) drug therapy: Secondary | ICD-10-CM | POA: Insufficient documentation

## 2018-03-18 DIAGNOSIS — E1152 Type 2 diabetes mellitus with diabetic peripheral angiopathy with gangrene: Secondary | ICD-10-CM | POA: Insufficient documentation

## 2018-03-18 DIAGNOSIS — E1169 Type 2 diabetes mellitus with other specified complication: Secondary | ICD-10-CM

## 2018-03-18 DIAGNOSIS — Z89432 Acquired absence of left foot: Secondary | ICD-10-CM | POA: Insufficient documentation

## 2018-03-18 MED ORDER — ATORVASTATIN CALCIUM 20 MG PO TABS: 20.0000 mg | ORAL_TABLET | Freq: Every day | ORAL | 6 refills | Status: DC

## 2018-03-18 MED ORDER — DOXYCYCLINE HYCLATE 100 MG PO TABS: 100.0000 mg | ORAL_TABLET | Freq: Two times a day (BID) | ORAL | 0 refills | Status: DC

## 2018-03-18 MED ORDER — HYDROCORTISONE 0.5 % EX CREA: 1.0000 "application " | TOPICAL_CREAM | Freq: Two times a day (BID) | CUTANEOUS | 0 refills | Status: DC

## 2018-03-18 MED ORDER — AMOXICILLIN-POT CLAVULANATE 875-125 MG PO TABS
1.0000 | ORAL_TABLET | Freq: Two times a day (BID) | ORAL | 0 refills | Status: DC
Start: 2018-03-18 — End: 2018-06-02

## 2018-03-18 MED ORDER — METFORMIN HCL 500 MG PO TABS: 500.0000 mg | ORAL_TABLET | Freq: Two times a day (BID) | ORAL | 6 refills | Status: DC

## 2018-03-18 MED ORDER — LISINOPRIL 10 MG PO TABS: 10.0000 mg | ORAL_TABLET | Freq: Every day | ORAL | 6 refills | Status: DC

## 2018-03-18 MED FILL — ?METFORMIN HCL 500MG TABLET: 500 | 30 days supply | Qty: 60 | Fill #0

## 2018-03-18 MED FILL — ?ATORVASTATIN 20 MG TABLET: 20 | 30 days supply | Qty: 30 | Fill #0

## 2018-03-18 MED FILL — LISINOPRIL 10 MG TABS: 10 | 30 days supply | Qty: 30 | Fill #0

## 2018-03-18 MED FILL — AMOX-CLAV 875-125 MG TABLET: 875-125 | 10 days supply | Qty: 20 | Fill #0

## 2018-03-18 MED FILL — DOXYCYCLINE HYCLATE 100 MG: 100 | 10 days supply | Qty: 20 | Fill #0

## 2018-03-18 NOTE — Telephone Encounter (Signed)
Met with patient during his office visit with provider. Reminded patient of the importance of seeing Dr. Sharol Given. Patient stated his doctor was Dr. [name] and didn't understand why he had to see Dr. Sharol Given. Explained to patient the reason why. Patient understood and stated  that he had called his office earlier in the day but didn't set up an appointment.   Called Dr. Jess Barters office [phone], while patient was in the office to scheduled an appointment. Spoke with Clemencia and an appointment was scheduled for Mar 23, 2018 at 3:30pm. Provided patient with 3 bus passes for him to return home and then to go to Dr. Jess Barters office.   Patient was also given the pharmacy blue card for a month. Informed patient that he had to present the card every time he picked up his prescription. Patient understood. Patient picked up his medication from [facility] pharmacy.   Following my conversation patient met with Veneda Melter, [place], to have the glucometer teaching.

## 2018-03-18 NOTE — Patient Instructions (Signed)
Thank you for coming to see me today. Please remember to check your sugars at home and bring these in to your next appointment.

## 2018-03-18 NOTE — Progress Notes (Signed)
Subjective:  Patient ID: JAHAAN Graham, male    DOB: January 08, 1956  Age: 62 y.o. MRN: [id number]  CC: Diabetes and Hospitalization Follow-up   HPI Mark Graham is a 62 year old male with a history of type 2 diabetes mellitus (A1c 6.5), hypertension, homelessness, depression with underlying psychotic disorder, status post left transmetatarsal amputation secondary to advanced gangrene from frostbite in 10/2017 with subsequent revision in 12/2017 here for follow-up visit where he was admitted from 03/07/18 - 03/11/18 for stump dehiscence. Xray of the left foot revealed no definite lytic destruction to suggest osteomyelitis. Commenced on IV Vancomycin which was later switched to oral antibiotics as per Orthopedic recommendation in addition to being non weight bearing and to follow up with Dr Lajoyce Corners outpatient He never picked up his antibiotics and never made an appointment with Dr Lajoyce Corners however he had come to the clinic 4 days ago for a dressing change. He complains of a pruritic rash on his forearms which he thinks came from poison Ivy and he is requesting something for it.  Past Medical History:  Diagnosis Date  . Arthritis    "probably in my hands" (11/24/2017)  . Cellulitis of left foot 11/24/2017  . High cholesterol   . Hypertension   . Osteomyelitis of left foot (HCC)    Osteomyelitis of left hallux with overlying cellulitis left foot/notes 11/24/2017  . Tobacco abuse   . Tuberculosis ~ 1990   "had tx for it"  . Type II diabetes mellitus (HCC)   . Wound infection 03/07/2018   LEFT FOOT    Past Surgical History:  Procedure Laterality Date  . AMPUTATION Left 11/25/2017   Procedure: TRANSMETATARSAL AMPUTATION;  Surgeon: Roby Lofts, MD;  Location: [facility] OR;  Service: Orthopedics;  Laterality: Left;  . AMPUTATION Left 01/15/2018   Procedure: REVISION TRANS METATARSAL AMPUTATION;  Surgeon: Nadara Mustard, MD;  Location: [facility] OR;  Service: Orthopedics;  Laterality: Left;    No Known  Allergies   Outpatient Medications Prior to Visit  Medication Sig Dispense Refill  . metFORMIN (GLUCOPHAGE) 500 MG tablet Take 1 tablet (500 mg total) by mouth 2 (two) times daily with a meal. 60 tablet 0  . acetaminophen (TYLENOL) 325 MG tablet Take 1-2 tablets (325-650 mg total) by mouth every 6 (six) hours as needed for mild pain (pain score 1-3 or temp > 100.5). (Patient not taking: Reported on 03/07/2018)    . Amino Acids-Protein Hydrolys (FEEDING SUPPLEMENT, PRO-STAT SUGAR FREE 64,) LIQD Take 30 mLs by mouth 2 (two) times daily. (Patient not taking: Reported on 03/18/2018) 60 Bottle 0  . bisacodyl (DULCOLAX) 10 MG suppository Place 1 suppository (10 mg total) rectally daily as needed for moderate constipation. (Patient not taking: Reported on 03/07/2018) 12 suppository 0  . Blood Glucose Monitoring Suppl (TRUE METRIX METER) DEVI 1 each by Does not apply route daily. (Patient not taking: Reported on 01/13/2018) 1 Device 0  . docusate sodium (COLACE) 100 MG capsule Take 1 capsule (100 mg total) by mouth 2 (two) times daily. (Patient not taking: Reported on 03/07/2018) 10 capsule 0  . glucose blood (TRUE METRIX BLOOD GLUCOSE TEST) test strip Use as instructed (Patient not taking: Reported on 03/18/2018) 100 each 12  . HYDROcodone-acetaminophen (NORCO/VICODIN) 5-325 MG tablet Take 1 tablet by mouth every 6 (six) hours as needed for moderate pain (pain score 4-6). (Patient not taking: Reported on 03/18/2018) 15 tablet 0  . polyethylene glycol (MIRALAX) packet Take 17 g by mouth daily as needed for  mild constipation. (Patient not taking: Reported on 03/07/2018) 14 each 0  . QUEtiapine (SEROQUEL) 50 MG tablet Take 1 tablet (50 mg total) by mouth at bedtime. (Patient not taking: Reported on 03/18/2018) 30 tablet 0  . silver sulfADIAZINE (SILVADENE) 1 % cream Apply topically daily. Wash left foot with soap and water apply Silvadene plus dry dressing change daily. (Patient not taking: Reported on 03/18/2018) 50 g 0   . TRUEPLUS LANCETS 26G MISC Use as directed (Patient not taking: Reported on 03/18/2018) 100 each 5  . amoxicillin-clavulanate (AUGMENTIN) 875-125 MG tablet Take 1 tablet by mouth every 12 (twelve) hours. (Patient not taking: Reported on 03/18/2018) 20 tablet 0  . atorvastatin (LIPITOR) 20 MG tablet Take 1 tablet (20 mg total) by mouth daily at 6 PM. (Patient not taking: Reported on 03/18/2018) 30 tablet 0  . doxycycline (VIBRA-TABS) 100 MG tablet Take 1 tablet (100 mg total) by mouth every 12 (twelve) hours. (Patient not taking: Reported on 03/18/2018) 20 tablet 0  . lisinopril (PRINIVIL,ZESTRIL) 10 MG tablet Take 1 tablet (10 mg total) by mouth daily. (Patient not taking: Reported on 03/18/2018) 30 tablet 0   No facility-administered medications prior to visit.     ROS Review of Systems  Constitutional: Negative for activity change and appetite change.  HENT: Negative for sinus pressure and sore throat.   Eyes: Negative for visual disturbance.  Respiratory: Negative for cough, chest tightness and shortness of breath.   Cardiovascular: Negative for chest pain and leg swelling.  Gastrointestinal: Negative for abdominal distention, abdominal pain, constipation and diarrhea.  Endocrine: Negative.   Genitourinary: Negative for dysuria.  Musculoskeletal: Negative for joint swelling and myalgias.  Skin: Positive for rash and wound.  Allergic/Immunologic: Negative.   Neurological: Negative for weakness, light-headedness and numbness.  Psychiatric/Behavioral: Negative for dysphoric mood and suicidal ideas.    Objective:  BP 114/72   Pulse 82   Temp 97.8 F (36.6 C) (Oral)   Ht  (1.803 m)   Wt 157 lb 12.8 oz (71.6 kg)   SpO2 98%   BMI 22.01 kg/m   BP/Weight 03/18/2018 03/11/2018 03/08/2018  Systolic BP 114 150 -  Diastolic BP 72 90 -  Wt. (Lbs) 157.8 - 154  BMI 22.01 - -      Physical Exam  Constitutional: He is oriented to person, place, and time. He appears well-developed and  well-nourished.  Cardiovascular: Normal rate and normal heart sounds.  No murmur heard. Posterior tibialis pulses - palpable Unable to palpate L dorsalis pedis pulse  Pulmonary/Chest: Effort normal and breath sounds normal. He has no wheezes. He has no rales. He exhibits no tenderness.  Abdominal: Soft. Bowel sounds are normal. He exhibits no distension and no mass. There is no tenderness.  Musculoskeletal: Normal range of motion.  Left transmetatarsal amputation, partial wound dehiscence in central portion with yellowish foul-smelling discharge-lateral aspect of wound with good granulation tissue Tenderness on palpation of sole of left foot.  Neurological: He is alert and oriented to person, place, and time.  Skin:  Coarse erythematous rash on extensor aspect of both forearms  Psychiatric: He has a normal mood and affect.   CMP Latest Ref Rng & Units 03/08/2018 03/07/2018 01/17/2018  Glucose 65 - 99 mg/dL 914(N) 829(F) 621(H)  BUN 6 - 20 mg/dL Creatinine 0.61 - 1.24 mg/dL 0.86 5.78 4.69  Sodium 135 - 145 mmol/L 141 139 137  Potassium 3.5 - 5.1 mmol/L 3.8 4.1 3.8  Chloride 101 -  111 mmol/L 105 100(L) 104  CO2 22 - 32 mmol/L Calcium 8.9 - 10.3 mg/dL 8.9 9.4 8.2(N)  Total Protein 6.5 - 8.1 g/dL - 7.1 -  Total Bilirubin 0.3 - 1.2 mg/dL - 0.6 -  Alkaline Phos 38 - 126 U/L - 99 -  AST 15 - 41 U/L - 21 -  ALT 17 - 63 U/L - 13(L) -    Lab Results  Component Value Date   HGBA1C 6.5 03/07/2018   CMP Latest Ref Rng & Units 03/08/2018 03/07/2018 01/17/2018  Glucose 65 - 99 mg/dL 562(Z) 308(M) 578(I)  BUN 6 - 20 mg/dL Creatinine 0.61 - 1.24 mg/dL 6.96 2.95 2.84  Sodium 135 - 145 mmol/L 141 139 137  Potassium 3.5 - 5.1 mmol/L 3.8 4.1 3.8  Chloride 101 - 111 mmol/L 105 100(L) 104  CO2 22 - 32 mmol/L Calcium 8.9 - 10.3 mg/dL 8.9 9.4 1.3(K)  Total Protein 6.5 - 8.1 g/dL - 7.1 -  Total Bilirubin 0.3 - 1.2 mg/dL - 0.6 -  Alkaline Phos 38 - 126 U/L - 99 -    AST 15 - 41 U/L - 21 -  ALT 17 - 63 U/L - 13(L) -    CBC    Component Value Date/Time   WBC 6.5 03/08/2018 0101   RBC 4.09 (L) 03/08/2018 0101   HGB 13.1 03/08/2018 0101   HCT 40.3 03/08/2018 0101   PLT 264 03/08/2018 0101   MCV 98.5 03/08/2018 0101   MCH 32.0 03/08/2018 0101   MCHC 32.5 03/08/2018 0101   RDW 14.8 03/08/2018 0101   LYMPHSABS 2.5 03/07/2018 1841   MONOABS 0.6 03/07/2018 1841   EOSABS 0.2 03/07/2018 1841   BASOSABS 0.1 03/07/2018 1841     Assessment & Plan:   1. S/P transmetatarsal amputation of foot, left (HCC) He has not been on the antibiotic he was discharged on I have refilled it today and we have ensured he obtained it today from the pharmacy. Also obtained appointment with Dr Lajoyce Corners for him  2. Rash - hydrocortisone cream 0.5 %; Apply 1 application topically 2 (two) times daily.  Dispense: 56 g; Refill: 0  3. Type 2 diabetes mellitus with other specified complication, without long-term current use of insulin (HCC) Controlled with A1c of 6.5 - metFORMIN (GLUCOPHAGE) 500 MG tablet; Take 1 tablet (500 mg total) by mouth 2 (two) times daily with a meal.  Dispense: 60 tablet; Refill: 6 - atorvastatin (LIPITOR) 20 MG tablet; Take 1 tablet (20 mg total) by mouth daily at 6 PM.  Dispense: 30 tablet; Refill: 6  4. Essential hypertension Controlled - lisinopril (PRINIVIL,ZESTRIL) 10 MG tablet; Take 1 tablet (10 mg total) by mouth daily.  Dispense: 30 tablet; Refill: 6   Meds ordered this encounter  Medications  . hydrocortisone cream 0.5 %    Sig: Apply 1 application topically 2 (two) times daily.    Dispense:  56 g    Refill:  0  . amoxicillin-clavulanate (AUGMENTIN) 875-125 MG tablet    Sig: Take 1 tablet by mouth every 12 (twelve) hours.    Dispense:  20 tablet    Refill:  0  . metFORMIN (GLUCOPHAGE) 500 MG tablet    Sig: Take 1 tablet (500 mg total) by mouth 2 (two) times daily with a meal.    Dispense:  60 tablet    Refill:  6  . lisinopril  (PRINIVIL,ZESTRIL) 10 MG tablet  Sig: Take 1 tablet (10 mg total) by mouth daily.    Dispense:  30 tablet    Refill:  6  . doxycycline (VIBRA-TABS) 100 MG tablet    Sig: Take 1 tablet (100 mg total) by mouth every 12 (twelve) hours.    Dispense:  20 tablet    Refill:  0  . atorvastatin (LIPITOR) 20 MG tablet    Sig: Take 1 tablet (20 mg total) by mouth daily at 6 PM.    Dispense:  30 tablet    Refill:  6    Follow-up: Return in about 1 month (around 04/18/2018) for Follow-up of chronic medical conditions.   Hoy Register MD

## 2018-03-22 ENCOUNTER — Telehealth: Payer: Self-pay

## 2018-03-22 NOTE — Telephone Encounter (Signed)
If he thinks he has a tick on his shoulder, he needs to have someone at his residence help him take it off rather than carry it around on his shoulder until tomorrow.

## 2018-03-22 NOTE — Telephone Encounter (Signed)
Call received from the patient. He stated that he thinks he has a tick on his left shoulder blade and he is not able to reach it. He wanted to know if someone at the [facility]  can look at it tomorrow, 03/23/18,  when he comes for his financial counseling appointment at 1330. He has an appointment with Dr Lajoyce Corners tomorrow afternoon after his appointment with the [facility] financial counselor.   Please advise

## 2018-03-23 ENCOUNTER — Telehealth: Payer: Self-pay

## 2018-03-23 ENCOUNTER — Inpatient Hospital Stay (INDEPENDENT_AMBULATORY_CARE_PROVIDER_SITE_OTHER): Payer: Self-pay | Admitting: Orthopedic Surgery

## 2018-03-23 ENCOUNTER — Ambulatory Visit: Payer: Self-pay | Attending: Family Medicine

## 2018-03-23 NOTE — Telephone Encounter (Signed)
Met with the patient when he was in the clinic today for his financial counseling appointment. This CM spoke to [place] with Dr Sharol Given and rescheduled an appointment for the patient for tomorrow - 03/24/18 @ 1400.  This information was provided to the patient in writing and he was given 2 bus passes to get to/from the appointment tomorrow. He was also given some food and t-shirts.

## 2018-03-24 ENCOUNTER — Inpatient Hospital Stay (INDEPENDENT_AMBULATORY_CARE_PROVIDER_SITE_OTHER): Payer: Self-pay | Admitting: Orthopedic Surgery

## 2018-03-28 ENCOUNTER — Telehealth: Payer: Self-pay

## 2018-03-28 ENCOUNTER — Inpatient Hospital Stay (INDEPENDENT_AMBULATORY_CARE_PROVIDER_SITE_OTHER): Payer: Self-pay | Admitting: Orthopedic Surgery

## 2018-03-28 NOTE — Telephone Encounter (Signed)
Attempted to contact the patient to remind him of his appointment with Dr Lajoyce Cornersuda this afternoon at 1545. Call placed to # [phone] and a HIPAA compliant voicemail message was left requesting a call back to # [phone].

## 2018-04-25 ENCOUNTER — Ambulatory Visit: Payer: Self-pay | Admitting: Family Medicine

## 2018-05-20 ENCOUNTER — Telehealth: Payer: Self-pay

## 2018-05-20 NOTE — Telephone Encounter (Signed)
Message received from the patient stating that [facility] and [facility] is [facility]. He would like to know what he should do. Please advise.  Not sure if he was approved for [employer] or [employer]. thanks

## 2018-05-24 NOTE — Telephone Encounter (Signed)
Call received from patient.  Stated that he has the [facility] and thought the hospital should be paying it. He was sent for physical rehabilitation after discharge from a hospitalization this year.   Instructed him to contact [facility] and ask them to  them to follow up with the hospital.   As per Theadora Ramaeisy Botello, [facility] Financial Counselor, the patient has been approved for the [employer] and [facility] Assistance since 03/23/2018.   The patient spoke in tangents about biblical verses. This CM inquired about the status of healing of the surgical site on his left foot.  He said that is still has not healed.  Reminded him of the importance of following up with Dr Lajoyce Cornersuda as he missed his last scheduled appointment. Explained the possible complications of the non healing wound if he does not seek medical attention soon. Marland Kitchen. He said that he understood but was not interested in scheduling an appointment with Dr Lajoyce Cornersuda or [facility]  at this time and then changed the subject back to biblical verses. Again this CM stressed the importance of medical follow up for his surgical site.   Message sent to Dr Lajoyce Cornersuda with an update.

## 2018-06-02 ENCOUNTER — Encounter (HOSPITAL_COMMUNITY): Payer: Self-pay | Admitting: Emergency Medicine

## 2018-06-02 ENCOUNTER — Emergency Department (HOSPITAL_COMMUNITY): Payer: Self-pay

## 2018-06-02 ENCOUNTER — Emergency Department (HOSPITAL_COMMUNITY)
Admission: EM | Admit: 2018-06-02 | Discharge: 2018-06-02 | Disposition: A | Discharge status: Home / Self Care | Payer: Self-pay | Attending: Emergency Medicine | Admitting: Emergency Medicine

## 2018-06-02 DIAGNOSIS — Z79899 Other long term (current) drug therapy: Secondary | ICD-10-CM | POA: Insufficient documentation

## 2018-06-02 DIAGNOSIS — Z89422 Acquired absence of other left toe(s): Secondary | ICD-10-CM | POA: Insufficient documentation

## 2018-06-02 DIAGNOSIS — M79672 Pain in left foot: Secondary | ICD-10-CM | POA: Insufficient documentation

## 2018-06-02 DIAGNOSIS — F17210 Nicotine dependence, cigarettes, uncomplicated: Secondary | ICD-10-CM | POA: Insufficient documentation

## 2018-06-02 DIAGNOSIS — E119 Type 2 diabetes mellitus without complications: Secondary | ICD-10-CM | POA: Insufficient documentation

## 2018-06-02 DIAGNOSIS — I10 Essential (primary) hypertension: Secondary | ICD-10-CM | POA: Insufficient documentation

## 2018-06-02 DIAGNOSIS — Z5189 Encounter for other specified aftercare: Secondary | ICD-10-CM | POA: Insufficient documentation

## 2018-06-02 DIAGNOSIS — Z7984 Long term (current) use of oral hypoglycemic drugs: Secondary | ICD-10-CM | POA: Insufficient documentation

## 2018-06-02 LAB — CBC WITH DIFFERENTIAL/PLATELET
ABS IMMATURE GRANULOCYTES: 0 10*3/uL (ref 0.0–0.1)
BASOS ABS: 0.1 10*3/uL (ref 0.0–0.1)
BASOS PCT: 1 %
Eosinophils Absolute: 0 10*3/uL (ref 0.0–0.7)
Eosinophils Relative: 0 %
HCT: 38.4 % — ABNORMAL LOW (ref 39.0–52.0)
Hemoglobin: 12.8 g/dL — ABNORMAL LOW (ref 13.0–17.0)
Immature Granulocytes: 0 %
Lymphocytes Relative: 15 %
Lymphs Abs: 1.2 10*3/uL (ref 0.7–4.0)
MCH: 32.7 pg (ref 26.0–34.0)
MCHC: 33.3 g/dL (ref 30.0–36.0)
MCV: 98.2 fL (ref 78.0–100.0)
MONOS PCT: 9 %
Monocytes Absolute: 0.8 10*3/uL (ref 0.1–1.0)
NEUTROS ABS: 6.2 10*3/uL (ref 1.7–7.7)
NEUTROS PCT: 75 %
PLATELETS: 357 10*3/uL (ref 150–400)
RBC: 3.91 MIL/uL — ABNORMAL LOW (ref 4.22–5.81)
RDW: 15.4 % (ref 11.5–15.5)
WBC: 8.3 10*3/uL (ref 4.0–10.5)

## 2018-06-02 LAB — COMPREHENSIVE METABOLIC PANEL
ALK PHOS: 95 U/L (ref 38–126)
ALT: 18 U/L (ref 0–44)
AST: 32 U/L (ref 15–41)
Albumin: 4.2 g/dL (ref 3.5–5.0)
Anion gap: 10 (ref 5–15)
BUN: <5 mg/dL — ABNORMAL LOW (ref 8–23)
CALCIUM: 9.4 mg/dL (ref 8.9–10.3)
CO2: 29 mmol/L (ref 22–32)
Chloride: 99 mmol/L (ref 98–111)
Creatinine, Ser: 0.95 mg/dL (ref 0.61–1.24)
GFR calc Af Amer: >60 mL/min (ref >60)
Glucose, Bld: 116 mg/dL — ABNORMAL HIGH (ref 70–99)
Potassium: 4.1 mmol/L (ref 3.5–5.1)
SODIUM: 138 mmol/L (ref 135–145)
Total Bilirubin: 1 mg/dL (ref 0.3–1.2)
Total Protein: 7.2 g/dL (ref 6.5–8.1)

## 2018-06-02 LAB — I-STAT CG4 LACTIC ACID, ED: LACTIC ACID, VENOUS: 1.38 mmol/L (ref 0.5–1.9)

## 2018-06-02 MED ORDER — DOXYCYCLINE HYCLATE 100 MG PO TABS: 100.0000 mg | ORAL_TABLET | Freq: Two times a day (BID) | ORAL | 0 refills | Status: DC

## 2018-06-02 MED ORDER — AMOXICILLIN-POT CLAVULANATE 875-125 MG PO TABS: 1.0000 | ORAL_TABLET | Freq: Two times a day (BID) | ORAL | 0 refills | Status: DC

## 2018-06-02 MED ORDER — DOXYCYCLINE HYCLATE 100 MG PO TABS
100.0000 mg | ORAL_TABLET | Freq: Once | ORAL | Status: AC
  Administered 2018-06-02: 100 mg via ORAL
  Filled 2018-06-02: qty 1

## 2018-06-02 MED ORDER — AMOXICILLIN-POT CLAVULANATE 875-125 MG PO TABS
1.0000 | ORAL_TABLET | Freq: Once | ORAL | Status: AC
  Administered 2018-06-02: 1 via ORAL
  Filled 2018-06-02: qty 1

## 2018-06-02 NOTE — ED Notes (Signed)
Patient currently at x-ray .  

## 2018-06-02 NOTE — ED Provider Notes (Signed)
[name] EMERGENCY DEPARTMENT Provider Note   CSN: [id number] Arrival date & time: 06/02/18  1729     History   Chief Complaint Chief Complaint  Patient presents with  . Wound Check    HPI Mark Graham is a 62 y.o. male with arthritis, diabetes hypertension, and osteomyelitis of the left foot which is status post transmetatarsal resection who presents due to increased redness and swelling in his left foot over the past several weeks.  He says that he ran out of his antibiotic which he is supposed to be taking.  He is not sure which antibiotic he is supposed to be on.  He denies fever, cough, chills, nausea, vomiting, diarrhea, and dysuria.  He says that his pain, redness, and swelling have improved over the past 1 to 2 days.  HPI  Past Medical History:  Diagnosis Date  . Arthritis    "probably in my hands" (11/24/2017)  . Cellulitis of left foot 11/24/2017  . High cholesterol   . Hypertension   . Osteomyelitis of left foot (HCC)    Osteomyelitis of left hallux with overlying cellulitis left foot/notes 11/24/2017  . Tobacco abuse   . Tuberculosis ~ 1990   "had tx for it"  . Type II diabetes mellitus (HCC)   . Wound infection 03/07/2018   LEFT FOOT    Patient Active Problem List   Diagnosis Date Noted  . AKI (acute kidney injury) (HCC) 03/08/2018  . HLD (hyperlipidemia) 03/07/2018  . Left foot infection 03/07/2018  . Wound infection after surgery   . Diabetes mellitus type 2 in nonobese (HCC) 01/14/2018  . Normochromic normocytic anemia 01/14/2018  . Osteomyelitis of left foot (HCC) 01/14/2018  . Foot osteomyelitis, left (HCC) 01/14/2018  . S/P transmetatarsal amputation of foot, left (HCC)   . Frostbite 11/24/2017  . Cellulitis 11/24/2017  . Depression 02/02/2017  . Insomnia 02/02/2017  . Protein-calorie malnutrition, severe 01/24/2017  . Urinary tract infection without hematuria   . Psychosis (HCC) 12/13/2016  . Weight loss 12/13/2016  .  Abnormal ECG 12/13/2016  . Iron overload 12/13/2016  . Elevated bilirubin 12/13/2016  . PSA elevation 12/13/2016  . Diabetes mellitus type 2, controlled, with complications (HCC) 12/12/2016  . Tobacco abuse 10/21/2015  . Hyperglycemia 10/21/2015  . Urinary frequency 10/21/2015  . RUQ abdominal pain 10/21/2015  . Hypertension 03/03/2014  . Hyponatremia 03/03/2014  . Chest pain 03/03/2014  . Leukocytosis 03/03/2014  . Urinary retention 03/03/2014    Past Surgical History:  Procedure Laterality Date  . AMPUTATION Left 11/25/2017   Procedure: TRANSMETATARSAL AMPUTATION;  Surgeon: Roby Lofts, MD;  Location: [facility] OR;  Service: Orthopedics;  Laterality: Left;  . AMPUTATION Left 01/15/2018   Procedure: REVISION TRANS METATARSAL AMPUTATION;  Surgeon: Nadara Mustard, MD;  Location: [facility] OR;  Service: Orthopedics;  Laterality: Left;        Home Medications    Prior to Admission medications   Medication Sig Start Date End Date Taking? Authorizing Provider  acetaminophen (TYLENOL) 325 MG tablet Take 1-2 tablets (325-650 mg total) by mouth every 6 (six) hours as needed for mild pain (pain score 1-3 or temp > 100.5). Patient not taking: Reported on 03/07/2018 01/17/18   Calvert Cantor, MD  Amino Acids-Protein Hydrolys (FEEDING SUPPLEMENT, PRO-STAT SUGAR FREE 64,) LIQD Take 30 mLs by mouth 2 (two) times daily. Patient not taking: Reported on 03/18/2018 03/11/18   Maretta Bees, MD  amoxicillin-clavulanate (AUGMENTIN) 875-125 MG tablet Take 1 tablet by  mouth every 12 (twelve) hours. 06/02/18   Talitha Givens, MD  atorvastatin (LIPITOR) 20 MG tablet Take 1 tablet (20 mg total) by mouth daily at 6 PM. 03/18/18   Hoy Register, MD  bisacodyl (DULCOLAX) 10 MG suppository Place 1 suppository (10 mg total) rectally daily as needed for moderate constipation. Patient not taking: Reported on 03/07/2018 01/17/18   Calvert Cantor, MD  Blood Glucose Monitoring Suppl (TRUE METRIX METER) DEVI 1 each by Does not  apply route daily. Patient not taking: Reported on 01/13/2018 11/29/17   Scherrie Gerlach, MD  docusate sodium (COLACE) 100 MG capsule Take 1 capsule (100 mg total) by mouth 2 (two) times daily. Patient not taking: Reported on 03/07/2018 01/17/18   Calvert Cantor, MD  doxycycline (VIBRA-TABS) 100 MG tablet Take 1 tablet (100 mg total) by mouth every 12 (twelve) hours. 06/02/18   Talitha Givens, MD  glucose blood (TRUE METRIX BLOOD GLUCOSE TEST) test strip Use as instructed Patient not taking: Reported on 03/18/2018 03/14/18   Hoy Register, MD  HYDROcodone-acetaminophen (NORCO/VICODIN) 5-325 MG tablet Take 1 tablet by mouth every 6 (six) hours as needed for moderate pain (pain score 4-6). Patient not taking: Reported on 03/18/2018 03/11/18   Maretta Bees, MD  hydrocortisone cream 0.5 % Apply 1 application topically 2 (two) times daily. 03/18/18   Hoy Register, MD  lisinopril (PRINIVIL,ZESTRIL) 10 MG tablet Take 1 tablet (10 mg total) by mouth daily. 03/18/18   Hoy Register, MD  metFORMIN (GLUCOPHAGE) 500 MG tablet Take 1 tablet (500 mg total) by mouth 2 (two) times daily with a meal. 03/18/18   Hoy Register, MD  polyethylene glycol (MIRALAX) packet Take 17 g by mouth daily as needed for mild constipation. Patient not taking: Reported on 03/07/2018 01/17/18   Calvert Cantor, MD  QUEtiapine (SEROQUEL) 50 MG tablet Take 1 tablet (50 mg total) by mouth at bedtime. Patient not taking: Reported on 03/18/2018 03/11/18   Maretta Bees, MD  silver sulfADIAZINE (SILVADENE) 1 % cream Apply topically daily. Wash left foot with soap and water apply Silvadene plus dry dressing change daily. Patient not taking: Reported on 03/18/2018 03/12/18   Maretta Bees, MD  TRUEPLUS LANCETS 26G MISC Use as directed Patient not taking: Reported on 03/18/2018 03/14/18   Hoy Register, MD    Family History Family History  Problem Relation Age of Onset  . Cancer Father   . Cancer Sister   . Diabetes Mellitus II  Unknown        States 6 out of 13 of his siblings     Social History Social History   Tobacco Use  . Smoking status: Current Every Day Smoker    Packs/day: 1.00    Years: 46.00    Pack years: 46.00    Types: Cigarettes  . Smokeless tobacco: Former Neurosurgeon    Types: Chew  Substance Use Topics  . Alcohol use: Yes    Alcohol/week: 6.0 standard drinks    Types: 6 Cans of beer per week  . Drug use: Yes    Types: Marijuana    Comment: 11/24/2017 "nothing since ~ 2002"     Allergies   Patient has no known allergies.   Review of Systems Review of Systems Review of Systems   Constitutional  Negative for fever  Negative for chills  HENT  Negative for ear pain  Negative for sore throat  Negative for difficultly swallowing  Eyes  Negative for eye pain  Negative for visual disturbance  Respiratory  Negative for shortness of breath  Negative for cough  CV  Negative for chest pain  Negative for leg swelling  Abdomen  Negative for abdominal pain  Negative for nausea  Negative for vomiting  MSK  +for extremity pain  Negative for back pain  Skin  +for rash  +for wound  Neuro  Negative for syncope  Negative for difficultly speaking  Psych  Negative for confusion   The remainder of the ROS was reviewed and negative except as documented above.      Physical Exam Updated Vital Signs BP (!) 168/90 (BP Location: Left Arm)   Pulse 83   Temp (!) 97.5 F (36.4 C) (Oral)   Resp 16   Ht 5\' 11"  (1.803 m)   Wt 70.3 kg   SpO2 96%   BMI 21.62 kg/m   Physical Exam Physical Exam Constitutional  Nursing notes reviewed  Vital signs reviewed  HEENT  No obvious trauma  Supple without meningismus, mass, or overt JVD  EOMI  No scleral icterus or injection  Respiratory  Effort normal  Faint bilateral rhonchi  No respiratory distress  CV  Tachycardic  No obvious murmurs  No pitting edema  Abdomen  Soft  Non-tender  Non-distended  No  peritonitis  MSK  Atraumatic  ROM appropriate  Left Foot:  Mildly erythematous around surgical site, no pus, no swelling, no crepitus, neurovascularly intact, normal temperature  Skin  Warm  Dry  Neuro  Awake and alert  EOMI  Moving all extremities  Psychiatric  Mood and affect normal        ED Treatments / Results  Labs (all labs ordered are listed, but only abnormal results are displayed) Labs Reviewed  COMPREHENSIVE METABOLIC PANEL - Abnormal; Notable for the following components:      Result Value   Glucose, Bld 116 (*)    BUN <5 (*)    All other components within normal limits  CBC WITH DIFFERENTIAL/PLATELET - Abnormal; Notable for the following components:   RBC 3.91 (*)    Hemoglobin 12.8 (*)    HCT 38.4 (*)    All other components within normal limits  I-STAT CG4 LACTIC ACID, ED    EKG None  Radiology Dg Foot Complete Left  Result Date: 06/02/2018 CLINICAL DATA:  62 y/o M; left foot amputation with pain for 3 days. Rule out osteomyelitis. EXAM: LEFT FOOT - COMPLETE 3+ VIEW COMPARISON:  03/07/2018 foot radiographs. FINDINGS: Transmetatarsal amputation. No erosive or destructive changes of the bones to suggest osteomyelitis. Bones are demineralized. No acute fracture or dislocation. IMPRESSION: No findings of osteomyelitis.  Transmetatarsal amputation. Electronically Signed   By: Mitzi Hansen M.D.   On: 06/02/2018 22:13    Procedures Procedures (including critical care time)  Medications Ordered in ED Medications  amoxicillin-clavulanate (AUGMENTIN) 875-125 MG per tablet 1 tablet (1 tablet Oral Given 06/02/18 2102)  doxycycline (VIBRA-TABS) tablet 100 mg (100 mg Oral Given 06/02/18 2102)     Initial Impression / Assessment and Plan / ED Course  I have reviewed the triage vital signs and the nursing notes.  Pertinent labs & imaging results that were available during my care of the patient were reviewed by me and considered in my medical  decision making (see chart for details).    Mark Graham presents due to concern for a left foot infection.  He says that he has noticed increased swelling and redness along his surgical site over the past few weeks.  On exam, he  is hemodynamically stable and afebrile.  There is only trace erythema around the wound site.  No other signs of acute infection.  Plain film reveals no evidence of osteo.  He does not have a leukocytosis or lactic acidemia.  His tachycardia in triage resolved.  I discharged him with doxycycline and Augmentin, which are the antibiotics that his PCP and orthopedist recommended that he take.  He agreed to follow-up with his PCP.  I believe that he is stable for outpatient management.  There are no signs of necrotizing fasciitis, DVT, or cellulitis.  Final Clinical Impressions(s) / ED Diagnoses   Final diagnoses:  Visit for wound check    ED Discharge Orders         Ordered    doxycycline (VIBRA-TABS) 100 MG tablet  Every 12 hours     06/02/18 2302    amoxicillin-clavulanate (AUGMENTIN) 875-125 MG tablet  Every 12 hours     06/02/18 2302           Talitha GivensAshburn, Justun Anaya, MD 06/02/18 2319    Charlynne PanderYao, David Hsienta, MD 06/03/18 2053

## 2018-06-02 NOTE — Discharge Instructions (Addendum)
Mark Graham:  Thank you for allowing us to take care of you today.  We hope you begin feeling better soon.  To-Do: Please follow-up with your primary doctor Please take your antibiotics as prescribed Please return to the Emergency Department or call 911 if you experience chest pain, shortness of breath, severe pain, severe fever, altered mental status, or have any reason to think that you need emergency medical care.  Thank you again.  Hope you feel better soon.

## 2018-06-02 NOTE — ED Triage Notes (Signed)
Pt states [place]. He has been having increased pain/swelling/redness/drainage over the past few weeks.

## 2018-06-02 NOTE — ED Notes (Signed)
Patient up to desk.  States he wants to leave.

## 2018-06-02 NOTE — ED Notes (Signed)
Made MD aware that pt is wanting to be discharged so he can take the bus

## 2019-03-14 ENCOUNTER — Telehealth: Payer: Self-pay

## 2019-03-14 NOTE — Telephone Encounter (Signed)
Call received from patient. He explained that he scheduled an  appointment with PCP - 03/28/2019. He said that he has experienced some coughing and is also concerned about his left foot, stating that he may need some " cream and  new shoes." he did not report any open areas on the left foot,  He was not coughing or short of breath while speaking to this CM. He said that the cough is due to tobacco.   He explained that he is receiving disability check - $653/month ( started 06/2018) and food stamps $191/month.  He continues to live in a property owned by his family but would eventually like to move into more stable housing.

## 2019-03-14 NOTE — Telephone Encounter (Signed)
He can keep his current appointment or schedule an earlier one if he so desires.

## 2019-03-15 NOTE — Telephone Encounter (Signed)
Attempted to contact [phone] #   to inquire if he would like an earlier appointment.  Message left requesting a call back to this CM # [phone].

## 2019-03-16 ENCOUNTER — Telehealth: Payer: Self-pay

## 2019-03-16 NOTE — Telephone Encounter (Signed)
Call placed to patient and informed him that an earlier appointment is available with Dr Alvis Lemmings - 03/22/2019 @ 1430 televisit.  He said that will work. He can come into office if needed. Informed him that he will be contacted the day prior to the appointment to confirm televisit v. In-person visit. He did not complain of any shortness of breath today.

## 2019-03-22 ENCOUNTER — Ambulatory Visit: Payer: Self-pay | Attending: Family Medicine | Admitting: Family Medicine

## 2019-03-23 ENCOUNTER — Other Ambulatory Visit: Payer: Self-pay | Admitting: Family Medicine

## 2019-03-23 DIAGNOSIS — I10 Essential (primary) hypertension: Secondary | ICD-10-CM

## 2019-03-28 ENCOUNTER — Other Ambulatory Visit: Payer: Self-pay

## 2019-03-28 ENCOUNTER — Ambulatory Visit: Payer: Self-pay | Attending: Family Medicine | Admitting: Family Medicine

## 2019-03-28 ENCOUNTER — Encounter: Payer: Self-pay | Admitting: Family Medicine

## 2019-03-28 DIAGNOSIS — M7918 Myalgia, other site: Secondary | ICD-10-CM

## 2019-03-28 DIAGNOSIS — Z91199 Patient's noncompliance with other medical treatment and regimen due to unspecified reason: Secondary | ICD-10-CM

## 2019-03-28 DIAGNOSIS — E1169 Type 2 diabetes mellitus with other specified complication: Secondary | ICD-10-CM

## 2019-03-28 DIAGNOSIS — L03115 Cellulitis of right lower limb: Secondary | ICD-10-CM

## 2019-03-28 DIAGNOSIS — Z9119 Patient's noncompliance with other medical treatment and regimen: Secondary | ICD-10-CM

## 2019-03-28 DIAGNOSIS — I10 Essential (primary) hypertension: Secondary | ICD-10-CM

## 2019-03-28 MED ORDER — METFORMIN HCL 500 MG PO TABS: 500.0000 mg | ORAL_TABLET | Freq: Two times a day (BID) | ORAL | 6 refills | Status: DC

## 2019-03-28 MED ORDER — LISINOPRIL 10 MG PO TABS: 10.0000 mg | ORAL_TABLET | Freq: Every day | ORAL | 1 refills | Status: DC

## 2019-03-28 MED ORDER — DOXYCYCLINE HYCLATE 100 MG PO TABS: 100.0000 mg | ORAL_TABLET | Freq: Two times a day (BID) | ORAL | 0 refills | Status: DC

## 2019-03-28 MED ORDER — CYCLOBENZAPRINE HCL 10 MG PO TABS: 10.0000 mg | ORAL_TABLET | Freq: Two times a day (BID) | ORAL | 0 refills | Status: DC | PRN

## 2019-03-28 MED ORDER — ATORVASTATIN CALCIUM 20 MG PO TABS: 20.0000 mg | ORAL_TABLET | Freq: Every day | ORAL | 6 refills | Status: DC

## 2019-03-28 NOTE — Progress Notes (Signed)
Virtual Visit via Telephone Note  I connected with Mark Graham, on 03/28/2019 at 2:34 PM by telephone due to the COVID-19 pandemic and verified that I am speaking with the correct person using two identifiers.   Consent: I discussed the limitations, risks, security and privacy concerns of performing an evaluation and management service by telephone and the availability of in person appointments. I also discussed with the patient that there may be a patient responsible charge related to this service. The patient expressed understanding and agreed to proceed.   Location of Patient: Home  Location of Provider: Clinic   Persons participating in Telemedicine visit: Mark Graham  Alicia Farrington-CMA Dr. Nelwyn SalisburyNewlin-PCP    History of Present Illness: Mark CastleJerry D Graham is a 63 year old male with a history of type 2 diabetes mellitus (A1c 6.5), hypertension, homelessness, depression with underlying psychotic disorder, status post left transmetatarsal amputation secondary to advanced gangrene from frostbite in 10/2017 with subsequent revision in 12/2017 hospitalized 03/07/18 - 03/11/18 for stump dehiscence.  He never followed up with orthopedics-Dr. Lajoyce Cornersuda last year after his procedure.  Today he complains his left stump looks blue in his right foot is peeling.  He denies discharge or drainage but states he needs an antibiotic for that foot as he thinks his shoe might be too small.  He denies the presence of fever. He has not been compliant with his diabetic medications or his antihypertensive and is requesting refills. He also has pain underneath both rib cages and his "belly" which he describes as an 8/10.  Denies a history of trauma and has no dyspnea, nausea, vomiting, diarrhea.   Past Medical History:  Diagnosis Date  . Arthritis    "probably in my hands" (11/24/2017)  . Cellulitis of left foot 11/24/2017  . High cholesterol   . Hypertension   . Osteomyelitis of left foot (HCC)    Osteomyelitis of  left hallux with overlying cellulitis left foot/notes 11/24/2017  . Tobacco abuse   . Tuberculosis ~ 1990   "had tx for it"  . Type II diabetes mellitus (HCC)   . Wound infection 03/07/2018   LEFT FOOT   No Known Allergies  Current Outpatient Medications on File Prior to Visit  Medication Sig Dispense Refill  . lisinopril (ZESTRIL) 10 MG tablet TAKE 1 TABLET (10 MG TOTAL) BY MOUTH DAILY. 30 tablet 1  . acetaminophen (TYLENOL) 325 MG tablet Take 1-2 tablets (325-650 mg total) by mouth every 6 (six) hours as needed for mild pain (pain score 1-3 or temp > 100.5). (Patient not taking: Reported on 03/07/2018)    . Amino Acids-Protein Hydrolys (FEEDING SUPPLEMENT, PRO-STAT SUGAR FREE 64,) LIQD Take 30 mLs by mouth 2 (two) times daily. (Patient not taking: Reported on 03/18/2018) 60 Bottle 0  . amoxicillin-clavulanate (AUGMENTIN) 875-125 MG tablet Take 1 tablet by mouth every 12 (twelve) hours. (Patient not taking: Reported on 03/28/2019) 20 tablet 0  . atorvastatin (LIPITOR) 20 MG tablet Take 1 tablet (20 mg total) by mouth daily at 6 PM. (Patient not taking: Reported on 03/28/2019) 30 tablet 6  . bisacodyl (DULCOLAX) 10 MG suppository Place 1 suppository (10 mg total) rectally daily as needed for moderate constipation. (Patient not taking: Reported on 03/07/2018) 12 suppository 0  . Blood Glucose Monitoring Suppl (TRUE METRIX METER) DEVI 1 each by Does not apply route daily. (Patient not taking: Reported on 01/13/2018) 1 Device 0  . docusate sodium (COLACE) 100 MG capsule Take 1 capsule (100 mg total) by mouth 2 (  two) times daily. (Patient not taking: Reported on 03/07/2018) 10 capsule 0  . doxycycline (VIBRA-TABS) 100 MG tablet Take 1 tablet (100 mg total) by mouth every 12 (twelve) hours. 20 tablet 0  . glucose blood (TRUE METRIX BLOOD GLUCOSE TEST) test strip Use as instructed (Patient not taking: Reported on 03/18/2018) 100 each 12  . HYDROcodone-acetaminophen (NORCO/VICODIN) 5-325 MG tablet Take 1 tablet  by mouth every 6 (six) hours as needed for moderate pain (pain score 4-6). (Patient not taking: Reported on 03/18/2018) 15 tablet 0  . hydrocortisone cream 0.5 % Apply 1 application topically 2 (two) times daily. (Patient not taking: Reported on 03/28/2019) 56 g 0  . metFORMIN (GLUCOPHAGE) 500 MG tablet Take 1 tablet (500 mg total) by mouth 2 (two) times daily with a meal. (Patient not taking: Reported on 03/28/2019) 60 tablet 6  . polyethylene glycol (MIRALAX) packet Take 17 g by mouth daily as needed for mild constipation. (Patient not taking: Reported on 03/07/2018) 14 each 0  . QUEtiapine (SEROQUEL) 50 MG tablet Take 1 tablet (50 mg total) by mouth at bedtime. (Patient not taking: Reported on 03/18/2018) 30 tablet 0  . silver sulfADIAZINE (SILVADENE) 1 % cream Apply topically daily. Wash left foot with soap and water apply Silvadene plus dry dressing change daily. (Patient not taking: Reported on 03/18/2018) 50 g 0  . TRUEPLUS LANCETS 26G MISC Use as directed (Patient not taking: Reported on 03/18/2018) 100 each 5   No current facility-administered medications on file prior to visit.     Observations/Objective: Awake, alert, oriented x3 Not in acute distress  CMP Latest Ref Rng & Units 06/02/2018 03/08/2018 03/07/2018  Glucose 70 - 99 mg/dL 540(G) 867(Y) 195(K)  BUN 8 - 23 mg/dL <9(T) 13 15  Creatinine 0.61 - 1.24 mg/dL 2.67 1.24 5.80  Sodium 135 - 145 mmol/L 138 141 139  Potassium 3.5 - 5.1 mmol/L 4.1 3.8 4.1  Chloride 98 - 111 mmol/L 99 105 100(L)  CO2 22 - 32 mmol/L 29 28 29   Calcium 8.9 - 10.3 mg/dL 9.4 8.9 9.4  Total Protein 6.5 - 8.1 g/dL 7.2 - 7.1  Total Bilirubin 0.3 - 1.2 mg/dL 1.0 - 0.6  Alkaline Phos 38 - 126 U/L 95 - 99  AST 15 - 41 U/L 32 - 21  ALT 0 - 44 U/L 18 - 13(L)    Lipid Panel     Component Value Date/Time   CHOL 150 03/03/2014 0630   TRIG 103 03/03/2014 0630   HDL 50 03/03/2014 0630   CHOLHDL 3.0 03/03/2014 0630   VLDL 21 03/03/2014 0630   LDLCALC 79 03/03/2014  0630    Lab Results  Component Value Date   HGBA1C 6.5 03/07/2018      Assessment and Plan: 1. Type 2 diabetes mellitus with other specified complication, without long-term current use of insulin (HCC) Controlled with last A1c of 6.5 He is due for an A1c-we will order at upcoming in person visit - atorvastatin (LIPITOR) 20 MG tablet; Take 1 tablet (20 mg total) by mouth daily at 6 PM.  Dispense: 30 tablet; Refill: 6 - metFORMIN (GLUCOPHAGE) 500 MG tablet; Take 1 tablet (500 mg total) by mouth 2 (two) times daily with a meal.  Dispense: 60 tablet; Refill: 6  2. Essential hypertension Controlled at last office visit Counseled on blood pressure goal of less than 130/80, low-sodium, DASH diet, medication compliance, 150 minutes of moderate intensity exercise per week. Discussed medication compliance, adverse effects. - lisinopril (ZESTRIL) 10  MG tablet; Take 1 tablet (10 mg total) by mouth daily.  Dispense: 30 tablet; Refill: 1  3. Non-compliance Emphasized the need to be compliant with his medications Discussed implications of noncompliance and complications of his medical conditions  4. Cellulitis of right lower extremity He never followed through with appointment with orthopedics Advised to schedule appointment with orthopedics Placed on antibiotics presumptively given his underlying comorbidities We will bring him in for an in person visit to evaluate further - doxycycline (VIBRA-TABS) 100 MG tablet; Take 1 tablet (100 mg total) by mouth every 12 (twelve) hours.  Dispense: 20 tablet; Refill: 0   Follow Up Instructions: Return in about 2 weeks (around 04/11/2019) for cellulitis.    I discussed the assessment and treatment plan with the patient. The patient was provided an opportunity to ask questions and all were answered. The patient agreed with the plan and demonstrated an understanding of the instructions.   The patient was advised to call back or seek an in-person  evaluation if the symptoms worsen or if the condition fails to improve as anticipated.     I provided 20 minutes total of non-face-to-face time during this encounter including median intraservice time, reviewing previous notes, labs, imaging, medications, management and patient verbalized understanding.     Hoy Register, MD, FAAFP. [facility] [place], [place] [phone]   03/28/2019, 2:34 PM

## 2019-03-28 NOTE — Progress Notes (Signed)
Patient has been called and DOB has been verified. Patient has been screened and transferred to PCP to start phone visit.  Patient would like to be tested for Hepatitis.  Patient states that he needs an antibiotic for his leg and right foot.

## 2019-03-28 NOTE — Patient Instructions (Signed)
Diabetes Mellitus and Foot Care  Foot care is an important part of your health, especially when you have diabetes. Diabetes may cause you to have problems because of poor blood flow (circulation) to your feet and legs, which can cause your skin to:   Become thinner and drier.   Break more easily.   Heal more slowly.   Peel and crack.  You may also have nerve damage (neuropathy) in your legs and feet, causing decreased feeling in them. This means that you may not notice minor injuries to your feet that could lead to more serious problems. Noticing and addressing any potential problems early is the best way to prevent future foot problems.  How to care for your feet  Foot hygiene   Wash your feet daily with warm water and mild soap. Do not use hot water. Then, pat your feet and the areas between your toes until they are completely dry. Do not soak your feet as this can dry your skin.   Trim your toenails straight across. Do not dig under them or around the cuticle. File the edges of your nails with an emery board or nail file.   Apply a moisturizing lotion or petroleum jelly to the skin on your feet and to dry, brittle toenails. Use lotion that does not contain alcohol and is unscented. Do not apply lotion between your toes.  Shoes and socks   Wear clean socks or stockings every day. Make sure they are not too tight. Do not wear knee-high stockings since they may decrease blood flow to your legs.   Wear shoes that fit properly and have enough cushioning. Always look in your shoes before you put them on to be sure there are no objects inside.   To break in new shoes, wear them for just a few hours a day. This prevents injuries on your feet.  Wounds, scrapes, corns, and calluses   Check your feet daily for blisters, cuts, bruises, sores, and redness. If you cannot see the bottom of your feet, use a mirror or ask someone for help.   Do not cut corns or calluses or try to remove them with medicine.   If you  find a minor scrape, cut, or break in the skin on your feet, keep it and the skin around it clean and dry. You may clean these areas with mild soap and water. Do not clean the area with peroxide, alcohol, or iodine.   If you have a wound, scrape, corn, or callus on your foot, look at it several times a day to make sure it is healing and not infected. Check for:  ? Redness, swelling, or pain.  ? Fluid or blood.  ? Warmth.  ? Pus or a bad smell.  General instructions   Do not cross your legs. This may decrease blood flow to your feet.   Do not use heating pads or hot water bottles on your feet. They may burn your skin. If you have lost feeling in your feet or legs, you may not know this is happening until it is too late.   Protect your feet from hot and cold by wearing shoes, such as at the beach or on hot pavement.   Schedule a complete foot exam at least once a year (annually) or more often if you have foot problems. If you have foot problems, report any cuts, sores, or bruises to your health care provider immediately.  Contact a health care provider if:     You have a medical condition that increases your risk of infection and you have any cuts, sores, or bruises on your feet.   You have an injury that is not healing.   You have redness on your legs or feet.   You feel burning or tingling in your legs or feet.   You have pain or cramps in your legs and feet.   Your legs or feet are numb.   Your feet always feel cold.   You have pain around a toenail.  Get help right away if:   You have a wound, scrape, corn, or callus on your foot and:  ? You have pain, swelling, or redness that gets worse.  ? You have fluid or blood coming from the wound, scrape, corn, or callus.  ? Your wound, scrape, corn, or callus feels warm to the touch.  ? You have pus or a bad smell coming from the wound, scrape, corn, or callus.  ? You have a fever.  ? You have a red line going up your leg.  Summary   Check your feet every day  for cuts, sores, red spots, swelling, and blisters.   Moisturize feet and legs daily.   Wear shoes that fit properly and have enough cushioning.   If you have foot problems, report any cuts, sores, or bruises to your health care provider immediately.   Schedule a complete foot exam at least once a year (annually) or more often if you have foot problems.  This information is not intended to replace advice given to you by your health care provider. Make sure you discuss any questions you have with your health care provider.  Document Released: 10/09/2000 Document Revised: 11/24/2017 Document Reviewed: 11/13/2016  Elsevier Interactive Patient Education  2019 [employer]

## 2019-04-05 MED FILL — ?ATORVASTATIN 20 MG TABLET: 20 | 30 days supply | Qty: 30 | Fill #0

## 2019-04-05 MED FILL — LISINOPRIL 10 MG TABS: 10 | 30 days supply | Qty: 30 | Fill #0

## 2019-04-05 MED FILL — CYCLOBENZAPRINE 10 MG TAB: 10 | 15 days supply | Qty: 30 | Fill #0

## 2019-04-05 MED FILL — ?DOXYCYCLINE HYCLATE 100MG: 100 | 10 days supply | Qty: 20 | Fill #0

## 2019-04-05 MED FILL — ?METFORMIN HCL 500MG TABLET: 500 | 30 days supply | Qty: 60 | Fill #0

## 2019-04-18 ENCOUNTER — Other Ambulatory Visit: Payer: Self-pay

## 2019-04-18 ENCOUNTER — Ambulatory Visit: Payer: Self-pay | Attending: Family Medicine | Admitting: Family Medicine

## 2019-04-18 ENCOUNTER — Encounter: Payer: Self-pay | Admitting: Family Medicine

## 2019-04-18 DIAGNOSIS — Z1159 Encounter for screening for other viral diseases: Secondary | ICD-10-CM

## 2019-04-18 DIAGNOSIS — Z720 Tobacco use: Secondary | ICD-10-CM

## 2019-04-18 DIAGNOSIS — E119 Type 2 diabetes mellitus without complications: Secondary | ICD-10-CM

## 2019-04-18 DIAGNOSIS — I10 Essential (primary) hypertension: Secondary | ICD-10-CM

## 2019-04-18 MED ORDER — LISINOPRIL 10 MG PO TABS: 10.0000 mg | ORAL_TABLET | Freq: Every day | ORAL | 6 refills | Status: DC

## 2019-04-18 NOTE — Progress Notes (Signed)
Patient has been called and DOB has been verified. Patient has been screened and transferred to PCP to start phone visit.  Patient would like to be tested for hep b Patient states that he has been very dizzy

## 2019-04-18 NOTE — Progress Notes (Signed)
Virtual Visit via Telephone Note  I connected with Mark Graham, on 04/18/2019 at 1:38pm by telephone due to the COVID-19 pandemic and verified that I am speaking with the correct person using two identifiers.   Consent: I discussed the limitations, risks, security and privacy concerns of performing an evaluation and management service by telephone and the availability of in person appointments. I also discussed with the patient that there may be a patient responsible charge related to this service. The patient expressed understanding and agreed to proceed.   Location of Patient: Home  Location of Provider: Clinic   Persons participating in Telemedicine visit: Emeric Novinger Farrington-CMA Dr. 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   He informs me he would like to be tested for hepatitis B and hepatitis C.  Endorses abdominal pain and thinks he might have picked up a stomach bug but denies fever, nausea, vomiting.  He does endorse some nausea. He has been compliant with his medications and does not need refills. At his last visit he had complained about change in color of his stump for which he received antibiotics and was advised to follow-up with orthopedics which he never did. He denies any symptoms at this time.  He smokes cigarettes and is not interested in quitting at this time. Past Medical History:  Diagnosis Date  . Arthritis    "probably in my hands" (11/24/2017)  . Cellulitis of left foot 11/24/2017  . High cholesterol   . Hypertension   . Osteomyelitis of left foot (HCC)    Osteomyelitis of left hallux with  overlying cellulitis left foot/notes 11/24/2017  . Tobacco abuse   . Tuberculosis ~ 1990   "had tx for it"  . Type II diabetes mellitus ([place])   . Wound infection 03/07/2018   LEFT FOOT   No Known Allergies  Current Outpatient Medications on File Prior to Visit  Medication Sig Dispense Refill  . atorvastatin (LIPITOR) 20 MG tablet Take 1 tablet (20 mg total) by mouth daily at 6 PM. 30 tablet 6  . cyclobenzaprine (FLEXERIL) 10 MG tablet Take 1 tablet (10 mg total) by mouth 2 (two) times daily as needed for muscle spasms. 30 tablet 0  . doxycycline (VIBRA-TABS) 100 MG tablet Take 1 tablet (100 mg total) by mouth every 12 (twelve) hours. 20 tablet 0  . lisinopril (ZESTRIL) 10 MG tablet Take 1 tablet (10 mg total) by mouth daily. 30 tablet 1  . metFORMIN (GLUCOPHAGE) 500 MG tablet Take 1 tablet (500 mg total) by mouth 2 (two) times daily with a meal. 60 tablet 6  . acetaminophen (TYLENOL) 325 MG tablet Take 1-2 tablets (325-650 mg total) by mouth every 6 (six) hours as needed for mild pain (pain score 1-3 or temp > 100.5). (Patient not taking: Reported on 03/07/2018)    . Amino Acids-Protein Hydrolys (FEEDING SUPPLEMENT, PRO-STAT SUGAR FREE 64,) LIQD Take 30 mLs by mouth 2 (two) times daily. (Patient not taking: Reported on 03/18/2018) 60 Bottle 0  . bisacodyl (DULCOLAX) 10 MG suppository Place 1 suppository (10 mg total) rectally daily as needed for moderate constipation. (Patient not taking: Reported on 03/07/2018) 12 suppository 0  . Blood Glucose Monitoring  Suppl (TRUE METRIX METER) DEVI 1 each by Does not apply route daily. (Patient not taking: Reported on 01/13/2018) 1 Device 0  . docusate sodium (COLACE) 100 MG capsule Take 1 capsule (100 mg total) by mouth 2 (two) times daily. (Patient not taking: Reported on 03/07/2018) 10 capsule 0  . glucose blood (TRUE METRIX BLOOD GLUCOSE TEST) test strip Use as instructed (Patient not taking: Reported on 03/18/2018) 100 each 12  . HYDROcodone-acetaminophen  (NORCO/VICODIN) 5-325 MG tablet Take 1 tablet by mouth every 6 (six) hours as needed for moderate pain (pain score 4-6). (Patient not taking: Reported on 03/18/2018) 15 tablet 0  . hydrocortisone cream 0.5 % Apply 1 application topically 2 (two) times daily. (Patient not taking: Reported on 03/28/2019) 56 g 0  . polyethylene glycol (MIRALAX) packet Take 17 g by mouth daily as needed for mild constipation. (Patient not taking: Reported on 03/07/2018) 14 each 0  . QUEtiapine (SEROQUEL) 50 MG tablet Take 1 tablet (50 mg total) by mouth at bedtime. (Patient not taking: Reported on 03/18/2018) 30 tablet 0  . silver sulfADIAZINE (SILVADENE) 1 % cream Apply topically daily. Wash left foot with soap and water apply Silvadene plus dry dressing change daily. (Patient not taking: Reported on 03/18/2018) 50 g 0  . TRUEPLUS LANCETS 26G MISC Use as directed (Patient not taking: Reported on 03/18/2018) 100 each 5   No current facility-administered medications on file prior to visit.     Observations/Objective: Awake, alert, oriented x3 Not in acute distress  Lab Results  Component Value Date   HGBA1C 6.5 03/07/2018    Assessment and Plan: 1. Essential hypertension Stable Counseled on blood pressure goal of less than 130/80, low-sodium, DASH diet, medication compliance, 150 minutes of moderate intensity exercise per week. Discussed medication compliance, adverse effects. - lisinopril (ZESTRIL) 10 MG tablet; Take 1 tablet (10 mg total) by mouth daily.  Dispense: 30 tablet; Refill: 6  2. Diabetes mellitus type 2 in nonobese (HCC) Controlled with A1c of 6.5 He is due for an A1c but would like to wait until he obtains his check on the eighth of next month We will order A1c which he will undergo on 05/04/2019 and adjust regimen accordingly - CMP14+EGFR; Future - Lipid panel; Future - Microalbumin / creatinine urine ratio; Future - Hemoglobin A1c; Future - VITAMIN D 25 Hydroxy (Vit-D Deficiency, Fractures);  Future  3. Tobacco abuse Spent 3 minutes counseling on cessation and he is not ready to quit at this time  4. Screening for viral disease - Hepatitis c antibody (reflex); Future - Hepatitis B surface antigen; Future   Follow Up Instructions: 3 months   I discussed the assessment and treatment plan with the patient. The patient was provided an opportunity to ask questions and all were answered. The patient agreed with the plan and demonstrated an understanding of the instructions.   The patient was advised to call back or seek an in-person evaluation if the symptoms worsen or if the condition fails to improve as anticipated.     I provided 17 minutes total of non-face-to-face time during this encounter including median intraservice time, reviewing previous notes, labs, imaging, medications, management and patient verbalized understanding.     [name], MD, FAAFP. [facility] [place], [place]   04/18/2019, 1:43 PM

## 2019-04-21 ENCOUNTER — Telehealth: Payer: Self-pay | Admitting: Family Medicine

## 2019-04-21 NOTE — Telephone Encounter (Signed)
Wants to know what are some D 3 foods

## 2019-05-04 ENCOUNTER — Other Ambulatory Visit: Payer: Self-pay

## 2019-05-05 ENCOUNTER — Other Ambulatory Visit: Payer: Self-pay

## 2019-07-24 ENCOUNTER — Ambulatory Visit: Payer: Self-pay | Admitting: Family Medicine

## 2019-07-24 ENCOUNTER — Other Ambulatory Visit: Payer: Self-pay

## 2019-12-27 ENCOUNTER — Inpatient Hospital Stay (HOSPITAL_COMMUNITY): Payer: Medicaid Other

## 2019-12-27 ENCOUNTER — Emergency Department (HOSPITAL_COMMUNITY): Payer: Medicaid Other

## 2019-12-27 ENCOUNTER — Encounter (HOSPITAL_COMMUNITY): Payer: Self-pay

## 2019-12-27 ENCOUNTER — Other Ambulatory Visit: Payer: Self-pay

## 2019-12-27 ENCOUNTER — Inpatient Hospital Stay (HOSPITAL_COMMUNITY)
Admission: EM | Admit: 2019-12-27 | Discharge: 2020-01-25 | DRG: 871 | Disposition: E | Payer: Medicaid Other | Attending: Student | Admitting: Student

## 2019-12-27 DIAGNOSIS — R6520 Severe sepsis without septic shock: Secondary | ICD-10-CM | POA: Diagnosis present

## 2019-12-27 DIAGNOSIS — R6521 Severe sepsis with septic shock: Secondary | ICD-10-CM | POA: Diagnosis not present

## 2019-12-27 DIAGNOSIS — Z7189 Other specified counseling: Secondary | ICD-10-CM | POA: Diagnosis not present

## 2019-12-27 DIAGNOSIS — F1010 Alcohol abuse, uncomplicated: Secondary | ICD-10-CM | POA: Diagnosis not present

## 2019-12-27 DIAGNOSIS — Z515 Encounter for palliative care: Secondary | ICD-10-CM | POA: Diagnosis not present

## 2019-12-27 DIAGNOSIS — N179 Acute kidney failure, unspecified: Secondary | ICD-10-CM | POA: Diagnosis present

## 2019-12-27 DIAGNOSIS — A4150 Gram-negative sepsis, unspecified: Secondary | ICD-10-CM | POA: Diagnosis present

## 2019-12-27 DIAGNOSIS — E119 Type 2 diabetes mellitus without complications: Secondary | ICD-10-CM

## 2019-12-27 DIAGNOSIS — Z7984 Long term (current) use of oral hypoglycemic drugs: Secondary | ICD-10-CM

## 2019-12-27 DIAGNOSIS — R627 Adult failure to thrive: Secondary | ICD-10-CM | POA: Diagnosis present

## 2019-12-27 DIAGNOSIS — F10229 Alcohol dependence with intoxication, unspecified: Secondary | ICD-10-CM | POA: Diagnosis present

## 2019-12-27 DIAGNOSIS — I9589 Other hypotension: Secondary | ICD-10-CM | POA: Diagnosis not present

## 2019-12-27 DIAGNOSIS — R4182 Altered mental status, unspecified: Secondary | ICD-10-CM | POA: Diagnosis not present

## 2019-12-27 DIAGNOSIS — J9811 Atelectasis: Secondary | ICD-10-CM | POA: Diagnosis present

## 2019-12-27 DIAGNOSIS — R000 Tachycardia, unspecified: Secondary | ICD-10-CM | POA: Diagnosis present

## 2019-12-27 DIAGNOSIS — E875 Hyperkalemia: Secondary | ICD-10-CM | POA: Diagnosis present

## 2019-12-27 DIAGNOSIS — I959 Hypotension, unspecified: Secondary | ICD-10-CM | POA: Diagnosis present

## 2019-12-27 DIAGNOSIS — Z6823 Body mass index (BMI) 23.0-23.9, adult: Secondary | ICD-10-CM

## 2019-12-27 DIAGNOSIS — Y901 Blood alcohol level of 20-39 mg/100 ml: Secondary | ICD-10-CM | POA: Diagnosis present

## 2019-12-27 DIAGNOSIS — E861 Hypovolemia: Secondary | ICD-10-CM | POA: Diagnosis present

## 2019-12-27 DIAGNOSIS — F17210 Nicotine dependence, cigarettes, uncomplicated: Secondary | ICD-10-CM | POA: Diagnosis present

## 2019-12-27 DIAGNOSIS — T34822A Frostbite with tissue necrosis of left foot, initial encounter: Secondary | ICD-10-CM

## 2019-12-27 DIAGNOSIS — R7881 Bacteremia: Secondary | ICD-10-CM | POA: Diagnosis present

## 2019-12-27 DIAGNOSIS — G9341 Metabolic encephalopathy: Secondary | ICD-10-CM | POA: Diagnosis present

## 2019-12-27 DIAGNOSIS — E1165 Type 2 diabetes mellitus with hyperglycemia: Secondary | ICD-10-CM | POA: Diagnosis present

## 2019-12-27 DIAGNOSIS — Z89432 Acquired absence of left foot: Secondary | ICD-10-CM

## 2019-12-27 DIAGNOSIS — E872 Acidosis: Secondary | ICD-10-CM | POA: Diagnosis present

## 2019-12-27 DIAGNOSIS — N136 Pyonephrosis: Secondary | ICD-10-CM | POA: Diagnosis present

## 2019-12-27 DIAGNOSIS — N1330 Unspecified hydronephrosis: Secondary | ICD-10-CM | POA: Diagnosis present

## 2019-12-27 DIAGNOSIS — E860 Dehydration: Secondary | ICD-10-CM | POA: Diagnosis present

## 2019-12-27 DIAGNOSIS — I96 Gangrene, not elsewhere classified: Secondary | ICD-10-CM | POA: Diagnosis not present

## 2019-12-27 DIAGNOSIS — R269 Unspecified abnormalities of gait and mobility: Secondary | ICD-10-CM | POA: Diagnosis present

## 2019-12-27 DIAGNOSIS — K760 Fatty (change of) liver, not elsewhere classified: Secondary | ICD-10-CM | POA: Diagnosis present

## 2019-12-27 DIAGNOSIS — E43 Unspecified severe protein-calorie malnutrition: Secondary | ICD-10-CM | POA: Diagnosis present

## 2019-12-27 DIAGNOSIS — Z66 Do not resuscitate: Secondary | ICD-10-CM | POA: Diagnosis not present

## 2019-12-27 DIAGNOSIS — R5381 Other malaise: Secondary | ICD-10-CM | POA: Diagnosis present

## 2019-12-27 DIAGNOSIS — E871 Hypo-osmolality and hyponatremia: Secondary | ICD-10-CM | POA: Diagnosis present

## 2019-12-27 DIAGNOSIS — R8281 Pyuria: Secondary | ICD-10-CM | POA: Diagnosis present

## 2019-12-27 DIAGNOSIS — M869 Osteomyelitis, unspecified: Secondary | ICD-10-CM | POA: Diagnosis present

## 2019-12-27 DIAGNOSIS — S81809A Unspecified open wound, unspecified lower leg, initial encounter: Secondary | ICD-10-CM | POA: Diagnosis present

## 2019-12-27 DIAGNOSIS — T34821A Frostbite with tissue necrosis of right foot, initial encounter: Secondary | ICD-10-CM

## 2019-12-27 DIAGNOSIS — R7989 Other specified abnormal findings of blood chemistry: Secondary | ICD-10-CM | POA: Diagnosis not present

## 2019-12-27 DIAGNOSIS — R5383 Other fatigue: Secondary | ICD-10-CM | POA: Diagnosis not present

## 2019-12-27 DIAGNOSIS — D696 Thrombocytopenia, unspecified: Secondary | ICD-10-CM | POA: Diagnosis present

## 2019-12-27 DIAGNOSIS — K828 Other specified diseases of gallbladder: Secondary | ICD-10-CM | POA: Diagnosis present

## 2019-12-27 DIAGNOSIS — Z8611 Personal history of tuberculosis: Secondary | ICD-10-CM

## 2019-12-27 DIAGNOSIS — E1152 Type 2 diabetes mellitus with diabetic peripheral angiopathy with gangrene: Secondary | ICD-10-CM | POA: Diagnosis present

## 2019-12-27 DIAGNOSIS — E7800 Pure hypercholesterolemia, unspecified: Secondary | ICD-10-CM | POA: Diagnosis present

## 2019-12-27 DIAGNOSIS — A419 Sepsis, unspecified organism: Secondary | ICD-10-CM | POA: Diagnosis not present

## 2019-12-27 DIAGNOSIS — Z79899 Other long term (current) drug therapy: Secondary | ICD-10-CM

## 2019-12-27 DIAGNOSIS — N281 Cyst of kidney, acquired: Secondary | ICD-10-CM | POA: Diagnosis present

## 2019-12-27 DIAGNOSIS — A4151 Sepsis due to Escherichia coli [E. coli]: Secondary | ICD-10-CM | POA: Diagnosis present

## 2019-12-27 DIAGNOSIS — I10 Essential (primary) hypertension: Secondary | ICD-10-CM | POA: Diagnosis not present

## 2019-12-27 DIAGNOSIS — L89150 Pressure ulcer of sacral region, unstageable: Secondary | ICD-10-CM | POA: Diagnosis present

## 2019-12-27 DIAGNOSIS — Z833 Family history of diabetes mellitus: Secondary | ICD-10-CM

## 2019-12-27 DIAGNOSIS — L98499 Non-pressure chronic ulcer of skin of other sites with unspecified severity: Secondary | ICD-10-CM

## 2019-12-27 DIAGNOSIS — R109 Unspecified abdominal pain: Secondary | ICD-10-CM

## 2019-12-27 DIAGNOSIS — F29 Unspecified psychosis not due to a substance or known physiological condition: Secondary | ICD-10-CM | POA: Diagnosis present

## 2019-12-27 DIAGNOSIS — Z720 Tobacco use: Secondary | ICD-10-CM | POA: Diagnosis not present

## 2019-12-27 DIAGNOSIS — Z20822 Contact with and (suspected) exposure to covid-19: Secondary | ICD-10-CM | POA: Diagnosis present

## 2019-12-27 LAB — CBC WITH DIFFERENTIAL/PLATELET
Abs Immature Granulocytes: 0.58 10*3/uL — ABNORMAL HIGH (ref 0.00–0.07)
Basophils Absolute: 0.1 10*3/uL (ref 0.0–0.1)
Basophils Relative: 0 %
Eosinophils Absolute: 0 10*3/uL (ref 0.0–0.5)
Eosinophils Relative: 0 %
HCT: 38.4 % — ABNORMAL LOW (ref 39.0–52.0)
Hemoglobin: 13.6 g/dL (ref 13.0–17.0)
Immature Granulocytes: 2 %
Lymphocytes Relative: 2 %
Lymphs Abs: 0.5 10*3/uL — ABNORMAL LOW (ref 0.7–4.0)
MCH: 33 pg (ref 26.0–34.0)
MCHC: 35.4 g/dL (ref 30.0–36.0)
MCV: 93.2 fL (ref 80.0–100.0)
Monocytes Absolute: 0.5 10*3/uL (ref 0.1–1.0)
Monocytes Relative: 2 %
Neutro Abs: 26.9 10*3/uL — ABNORMAL HIGH (ref 1.7–7.7)
Neutrophils Relative %: 94 %
Platelets: 191 10*3/uL (ref 150–400)
RBC: 4.12 MIL/uL — ABNORMAL LOW (ref 4.22–5.81)
RDW: 13.2 % (ref 11.5–15.5)
WBC: 28.5 10*3/uL — ABNORMAL HIGH (ref 4.0–10.5)
nRBC: 0 % (ref 0.0–0.2)

## 2019-12-27 LAB — COMPREHENSIVE METABOLIC PANEL
ALT: 39 U/L (ref 0–44)
AST: 70 U/L — ABNORMAL HIGH (ref 15–41)
Albumin: 2 g/dL — ABNORMAL LOW (ref 3.5–5.0)
Alkaline Phosphatase: 132 U/L — ABNORMAL HIGH (ref 38–126)
Anion gap: 18 — ABNORMAL HIGH (ref 5–15)
BUN: 63 mg/dL — ABNORMAL HIGH (ref 8–23)
CO2: 15 mmol/L — ABNORMAL LOW (ref 22–32)
Calcium: 8.1 mg/dL — ABNORMAL LOW (ref 8.9–10.3)
Chloride: 76 mmol/L — ABNORMAL LOW (ref 98–111)
Creatinine, Ser: 3.67 mg/dL — ABNORMAL HIGH (ref 0.61–1.24)
GFR calc Af Amer: 19 mL/min — ABNORMAL LOW (ref >60)
GFR calc non Af Amer: 17 mL/min — ABNORMAL LOW (ref >60)
Glucose, Bld: 184 mg/dL — ABNORMAL HIGH (ref 70–99)
Potassium: 6.1 mmol/L — ABNORMAL HIGH (ref 3.5–5.1)
Sodium: 109 mmol/L — CL (ref 135–145)
Total Bilirubin: 1.4 mg/dL — ABNORMAL HIGH (ref 0.3–1.2)
Total Protein: 6.5 g/dL (ref 6.5–8.1)

## 2019-12-27 LAB — MAGNESIUM: Magnesium: 1.9 mg/dL (ref 1.7–2.4)

## 2019-12-27 LAB — HEMOGLOBIN A1C
Hgb A1c MFr Bld: 6.5 % — ABNORMAL HIGH (ref 4.8–5.6)
Mean Plasma Glucose: 139.85 mg/dL

## 2019-12-27 LAB — ETHANOL: Alcohol, Ethyl (B): 22 mg/dL — ABNORMAL HIGH (ref <10)

## 2019-12-27 LAB — RESPIRATORY PANEL BY RT PCR (FLU A&B, COVID)
Influenza A by PCR: NEGATIVE
Influenza B by PCR: NEGATIVE
SARS Coronavirus 2 by RT PCR: NEGATIVE

## 2019-12-27 LAB — CK: Total CK: 392 U/L (ref 49–397)

## 2019-12-27 LAB — PHOSPHORUS: Phosphorus: 6.3 mg/dL — ABNORMAL HIGH (ref 2.5–4.6)

## 2019-12-27 LAB — APTT: aPTT: 32 seconds (ref 24–36)

## 2019-12-27 LAB — PROTIME-INR
INR: 1.3 — ABNORMAL HIGH (ref 0.8–1.2)
Prothrombin Time: 15.8 seconds — ABNORMAL HIGH (ref 11.4–15.2)

## 2019-12-27 LAB — LACTIC ACID, PLASMA
Lactic Acid, Venous: 3.5 mmol/L (ref 0.5–1.9)
Lactic Acid, Venous: 2.8 mmol/L (ref 0.5–1.9)

## 2019-12-27 LAB — POC SARS CORONAVIRUS 2 AG -  ED: SARS Coronavirus 2 Ag: NEGATIVE

## 2019-12-27 MED ORDER — PIPERACILLIN-TAZOBACTAM 3.375 G IVPB 30 MIN
3.3750 g | Freq: Once | INTRAVENOUS | Status: AC
  Administered 2019-12-27: 3.375 g via INTRAVENOUS
  Filled 2019-12-27: qty 50

## 2019-12-27 MED ORDER — VANCOMYCIN VARIABLE DOSE PER UNSTABLE RENAL FUNCTION (PHARMACIST DOSING): Status: DC

## 2019-12-27 MED ORDER — INSULIN ASPART 100 UNIT/ML ~~LOC~~ SOLN: 0.0000 [IU] | Freq: Three times a day (TID) | SUBCUTANEOUS | Status: DC

## 2019-12-27 MED ORDER — PIPERACILLIN-TAZOBACTAM 3.375 G IVPB
3.3750 g | Freq: Three times a day (TID) | INTRAVENOUS | Status: DC
  Administered 2019-12-28 (×2): 3.375 g via INTRAVENOUS
  Filled 2019-12-27 (×2): qty 50

## 2019-12-27 MED ORDER — FOLIC ACID 5 MG/ML IJ SOLN
1.0000 mg | Freq: Every day | INTRAMUSCULAR | Status: DC
  Administered 2019-12-28: 1 mg via INTRAVENOUS
  Filled 2019-12-27: qty 0.2

## 2019-12-27 MED ORDER — THIAMINE HCL 100 MG/ML IJ SOLN
100.0000 mg | Freq: Every day | INTRAMUSCULAR | Status: DC
  Administered 2019-12-28: 09:00:00 100 mg via INTRAVENOUS
  Filled 2019-12-27: qty 2

## 2019-12-27 MED ORDER — VANCOMYCIN HCL 1500 MG/300ML IV SOLN
1500.0000 mg | Freq: Once | INTRAVENOUS | Status: AC
  Administered 2019-12-27: 20:00:00 1500 mg via INTRAVENOUS
  Filled 2019-12-27: qty 300

## 2019-12-27 MED ORDER — SODIUM CHLORIDE 0.9 % IV BOLUS
2250.0000 mL | Freq: Once | INTRAVENOUS | Status: AC
  Administered 2019-12-27: 2250 mL via INTRAVENOUS

## 2019-12-27 NOTE — H&P (Signed)
NAME:  Mark Graham, MRN:  [id number], DOB:  05-May-1956, LOS: 0 ADMISSION DATE:  01/22/2020, CONSULTATION DATE:  3/3 REFERRING MD:  Dr. Renaye Rakers EDP, CHIEF COMPLAINT:  AMS   Brief History   6 M admitted for failure to thrive after 2 weeks in recliner without moving. Multiple pressure wounds present on admission and renal failure noted.   History of present illness   64 year old male with PMH as below, which is significant for DM, HTN, psychotic disorder, and is status post L transmetatarsal amputation by Dr. Lajoyce Corners. He smokes cigarette and drinks 6 beers per day and he lives at home where it seems as though his brother checks up on him from time to time, and on 3/3 his brother found him to be altered. Brother reported the patient had been in his recliner for the previous two weeks and had not gotten up. He had been incontinent of bowel and bladder. EMS was called and he was transported to [facility] ED. Upon presentation to ED he was very confused and limited in his responsiveness. He was found to have extensive pressure/vascular wounds to the sacrum and bilateral lower extremities with areas of eschar. Stool had previously been coating the wounds. He was treated empirically with antibiotics for sepsis. PCCM asked to see for admission.   Past Medical History   has a past medical history of Arthritis, Cellulitis of left foot (11/24/2017), High cholesterol, Hypertension, Osteomyelitis of left foot (HCC), Tobacco abuse, Tuberculosis (~ 1990), Type II diabetes mellitus (HCC), and Wound infection (03/07/2018).   Significant Hospital Events   3/3 admit  Consults:    Procedures:    Significant Diagnostic Tests:    Micro Data:  Blood Urine   Antimicrobials:  Zosyn, Vanco  Interim history/subjective:    Objective   Blood pressure (!) 111/92, pulse (!) 106, temperature 98.2 F (36.8 C), temperature source Oral, resp. rate (!) 21, height 5\' 11"  (1.803 m), weight 77.1 kg, SpO2 99 %.         Intake/Output Summary (Last 24 hours) at 01/22/2020 1957 Last data filed at 01/03/2020 1913 Gross per 24 hour  Intake 50 ml  Output --  Net 50 ml   Filed Weights   01/22/2020 1758  Weight: 77.1 kg    Examination: General: Frail appearing adult male HENT: [place]/AT, PERRL, no JVD. EOM intact Lungs: Bibasilar crackles. Unlabored. sats in 90s on room air  Cardiovascular: RRR, no MRG. Pulses weak/unpalpable in distal lower extremities.  Abdomen: Abdomen soft, distended, non-tender.  Extremities: L foot remote amputation. Multiple wounds to sacrum and lower extremites. Please CT EDP note for photos.  Neuro: Alert. Oriented to self, place. Follows commands  Resolved Hospital Problem list     Assessment & Plan:   Multiple wounds: bilateral lower extremities and sacrum. Bilateral feet. Remote L foot amputation. Feet cool with unpalpable pulses.  - Recommend surgical consult for lower extremity wounds and possibly vascular consult. Per primary.  - Arterial and venous duplexes of bilateral lower extremities.  - Bedside doppler peripheral pulses - WOC consult  AKI: suspect this is pre-renal in the setting of hypovolemia Hypovolemic hyponatremia: dehydration +/- beer potomania - IVF hydration (30cc/Kg bolus ordered) - Follow BMP  Possible sepsis - ABX per primary  ETOH abuse - Thiamine, folic acid - observe for signs of withdrawal  Failure to thrive - per primary  Patient is not currently requiring ICU level care. Should his condition change please re-consult PCCM. We have asked the  ED provider to involve the hospitalists for admission.   Best practice:  Diet: per primary Pain/Anxiety/Delirium protocol (if indicated): NA VAP protocol (if indicated): NA DVT prophylaxis: per primary GI prophylaxis: Per primary Code Status: FULL Family Communication: patient updated Disposition: SDU  Labs   CBC: Recent Labs  Lab 01/01/2020 1832  WBC 28.5*  NEUTROABS 26.9*  HGB 13.6  HCT  38.4*  MCV 93.2  PLT 191    Basic Metabolic Panel: Recent Labs  Lab 01/03/2020 1832  NA 109*  K 6.1*  CL 76*  CO2 15*  GLUCOSE 184*  BUN 63*  CREATININE 3.67*  CALCIUM 8.1*  MG 1.9   GFR: Estimated Creatinine Clearance: 21.9 mL/min (A) (by C-G formula based on SCr of 3.67 mg/dL (H)). Recent Labs  Lab 01/14/2020 1832  WBC 28.5*  LATICACIDVEN 3.5*    Liver Function Tests: Recent Labs  Lab 01/13/2020 1832  AST 70*  ALT 39  ALKPHOS 132*  BILITOT 1.4*  PROT 6.5  ALBUMIN 2.0*   No results for input(s): LIPASE, AMYLASE in the last 168 hours. No results for input(s): AMMONIA in the last 168 hours.  ABG    Component Value Date/Time   TCO2 28 01/05/2017 1623     Coagulation Profile: Recent Labs  Lab 01/20/2020 1832  INR 1.3*    Cardiac Enzymes: Recent Labs  Lab 01/24/2020 1832  CKTOTAL 392    HbA1C: Hemoglobin A1C  Date/Time Value Ref Range Status  03/07/2018 02:34 PM 6.5  Final   Hgb A1c MFr Bld  Date/Time Value Ref Range Status  11/25/2017 06:22 AM 7.2 (H) 4.8 - 5.6 % Final    Comment:    (NOTE) Pre diabetes:          5.7%-6.4% Diabetes:              >6.4% Glycemic control for   <7.0% adults with diabetes   12/13/2016 02:21 AM 7.2 (H) 4.8 - 5.6 % Final    Comment:    (NOTE)         Pre-diabetes: 5.7 - 6.4         Diabetes: >6.4         Glycemic control for adults with diabetes: <7.0     CBG: No results for input(s): GLUCAP in the last 168 hours.  Review of Systems:   Bolds are positive  Constitutional: weight loss, gain, night sweats, Fevers, chills, fatigue .  HEENT: headaches, Sore throat, sneezing, nasal congestion, post nasal drip, Difficulty swallowing, Tooth/dental problems, visual complaints visual changes, ear ache CV:  chest pain, radiates:,Orthopnea, PND, swelling in lower extremities, dizziness, palpitations, syncope.  GI  heartburn, indigestion, abdominal pain, nausea, vomiting, diarrhea, change in bowel habits, loss of appetite,  bloody stools.  Resp: cough, productive:, hemoptysis, dyspnea, chest pain, pleuritic.  Skin: rash or itching or icterus GU: dysuria, change in color of urine, urgency or frequency. flank pain, hematuria  MS: joint pain or swelling. decreased range of motion  Psych: change in mood or affect. depression or anxiety.  Neuro: difficulty with speech, weakness, numbness, ataxia    Past Medical History  He,  has a past medical history of Arthritis, Cellulitis of left foot (11/24/2017), High cholesterol, Hypertension, Osteomyelitis of left foot (HCC), Tobacco abuse, Tuberculosis (~ 1990), Type II diabetes mellitus (HCC), and Wound infection (03/07/2018).   Surgical History    Past Surgical History:  Procedure Laterality Date  . AMPUTATION Left 11/25/2017   Procedure: TRANSMETATARSAL AMPUTATION;  Surgeon: Roby Lofts,  MD;  Location: [place];  Service: Orthopedics;  Laterality: Left;  . AMPUTATION Left 01/15/2018   Procedure: REVISION TRANS METATARSAL AMPUTATION;  Surgeon: Newt Minion, MD;  Location: [place];  Service: Orthopedics;  Laterality: Left;     Social History   reports that he has been smoking cigarettes. He has a 46.00 pack-year smoking history. He has quit using smokeless tobacco.  His smokeless tobacco use included chew. He reports current alcohol use of about 6.0 standard drinks of alcohol per week. He reports current drug use. Drug: Marijuana.   Family History   His family history includes Cancer in his father and sister; Diabetes Mellitus II in an other family member.   Allergies No Known Allergies   Home Medications  Prior to Admission medications   Medication Sig Start Date End Date Taking? Authorizing Provider  acetaminophen (TYLENOL) 325 MG tablet Take 1-2 tablets (325-650 mg total) by mouth every 6 (six) hours as needed for mild pain (pain score 1-3 or temp > 100.5). Patient not taking: Reported on 03/07/2018 01/17/18   Debbe Odea, MD  Amino Acids-Protein Hydrolys  (FEEDING SUPPLEMENT, PRO-STAT SUGAR FREE 64,) LIQD Take 30 mLs by mouth 2 (two) times daily. Patient not taking: Reported on 03/18/2018 03/11/18   Jonetta Osgood, MD  atorvastatin (LIPITOR) 20 MG tablet Take 1 tablet (20 mg total) by mouth daily at 6 PM. 03/28/19   [name], MD  bisacodyl (DULCOLAX) 10 MG suppository Place 1 suppository (10 mg total) rectally daily as needed for moderate constipation. Patient not taking: Reported on 03/07/2018 01/17/18   Debbe Odea, MD  Blood Glucose Monitoring Suppl (TRUE METRIX METER) DEVI 1 each by Does not apply route daily. Patient not taking: Reported on 01/13/2018 11/29/17   Colbert Ewing, MD  cyclobenzaprine (FLEXERIL) 10 MG tablet Take 1 tablet (10 mg total) by mouth 2 (two) times daily as needed for muscle spasms. 03/28/19   [name], MD  docusate sodium (COLACE) 100 MG capsule Take 1 capsule (100 mg total) by mouth 2 (two) times daily. Patient not taking: Reported on 03/07/2018 01/17/18   Debbe Odea, MD  doxycycline (VIBRA-TABS) 100 MG tablet Take 1 tablet (100 mg total) by mouth every 12 (twelve) hours. 03/28/19   [name], MD  glucose blood (TRUE METRIX BLOOD GLUCOSE TEST) test strip Use as instructed Patient not taking: Reported on 03/18/2018 03/14/18   [name], MD  HYDROcodone-acetaminophen (NORCO/VICODIN) 5-325 MG tablet Take 1 tablet by mouth every 6 (six) hours as needed for moderate pain (pain score 4-6). Patient not taking: Reported on 03/18/2018 03/11/18   Jonetta Osgood, MD  hydrocortisone cream 0.5 % Apply 1 application topically 2 (two) times daily. Patient not taking: Reported on 03/28/2019 03/18/18   [name], MD  lisinopril (ZESTRIL) 10 MG tablet Take 1 tablet (10 mg total) by mouth daily. 04/18/19   [name], MD  metFORMIN (GLUCOPHAGE) 500 MG tablet Take 1 tablet (500 mg total) by mouth 2 (two) times daily with a meal. 03/28/19   [name], MD  polyethylene glycol (MIRALAX) packet Take 17 g by  mouth daily as needed for mild constipation. Patient not taking: Reported on 03/07/2018 01/17/18   Debbe Odea, MD  QUEtiapine (SEROQUEL) 50 MG tablet Take 1 tablet (50 mg total) by mouth at bedtime. Patient not taking: Reported on 03/18/2018 03/11/18   Jonetta Osgood, MD  silver sulfADIAZINE (SILVADENE) 1 % cream Apply topically daily. Wash left foot with soap and water apply Silvadene  plus dry dressing change daily. Patient not taking: Reported on 03/18/2018 03/12/18   Maretta Bees, MD  TRUEPLUS LANCETS 26G MISC Use as directed Patient not taking: Reported on 03/18/2018 03/14/18   Hoy Register, MD      Joneen Roach, AGACNP-BC  Pulmonary/Critical Care  See Amion for personal pager PCCM on call pager [phone]  12/28/2019 8:40 PM

## 2019-12-27 NOTE — ED Notes (Signed)
Pt returned from US

## 2019-12-27 NOTE — ED Notes (Signed)
NT's diligently worked to remove clothing and feces from the pt. EDP has taken pictures of the numerous wounds on the pt posterior and feet.

## 2019-12-27 NOTE — Progress Notes (Signed)
Pharmacy Antibiotic Note  Mark Graham is a 64 y.o. male admitted on 01/07/2020 with sepsis.  Pharmacy has been consulted for Vancomycin and Zosyn dosing.  He was admitted with failure to thrive. He was found sitting in his own feces and per family, he has a history of not taking care of himself. Photos of skin wounds appear severe and can be seen in the ED provider's note from 3/3. WBC is 28.5 and he is afebrile. Lactic acid is 3.5 and Scr is 3.67 (Baseline 0.8-1.2).  Plan: Vancomycin 1500mg  IV x1 then trend Scr for further doses. Goal AUC 400-550. Zosyn 3.375g IV q8h (4 hour infusion) F/u cultures, clinical progress, and LOT F/u vancomycin levels as clinically indicated  Height: 5\' 11"  (180.3 cm) Weight: 170 lb (77.1 kg) IBW/kg (Calculated) : 75.3  Temp (24hrs), Avg:98.2 F (36.8 C), Min:98.2 F (36.8 C), Max:98.2 F (36.8 C)  Recent Labs  Lab 01/03/2020 1832  WBC 28.5*  CREATININE 3.67*  LATICACIDVEN 3.5*    Estimated Creatinine Clearance: 21.9 mL/min (A) (by C-G formula based on SCr of 3.67 mg/dL (H)).    No Known Allergies  Antimicrobials this admission: 3/3 Vancoymcin >>  3/3 Zosyn >>   Dose adjustments this admission: N/A  Microbiology results: 3/3 BCx: sent 3/3 UCx: sent   Thank you for allowing pharmacy to be a part of this patient's care.  , PharmD PGY1 Ambulatory Care Pharmacy Resident 01/06/2020 8:49 PM

## 2019-12-27 NOTE — ED Notes (Signed)
Pt transported to US

## 2019-12-27 NOTE — Progress Notes (Signed)
Notified provider of need to order repeat lactic acid. ° °

## 2019-12-27 NOTE — ED Triage Notes (Signed)
Pt bib [facility] from home w/ c/o lethargy and weakness. Pt has been unable to walk x 2 weeks and per patient's brother pt has been sitting in recliner chair for that entire time. Pt AOx4, however, pt covered in feces and urine. EMS VSS. CBG 221.

## 2019-12-27 NOTE — H&P (Addendum)
[facility] History and Physical  HYMAN CROSSAN NOM:[id number] DOB: 12/28/1955 DOA: 12/30/2019  Referring physician: Langston Masker, MD PCP: [name], MD   Chief Complaint: Failure to Thrive   HPI: YEISON SIPPEL is a 64 y.o. male with PMH DM s/p L transmetatarsal amputation, HTN, psychotic disorder, alcohol abuse, tobacco use who presents for failure to thrive and found to have necrotic wounds, sepsis, hyponatremia, AKI. Patient brought from home by EMS due to concerns from brother who visited patient earlier today and found him sitting in his own stool. Initially the patient was minimally responsive and confused but after ED evaluation and initial resuscitation, patient now alert and oriented. Patient reports that he lives alone. He is unable to ambulate but his brother will occasionally check on him and drop food/beer off. Reports that he has not eaten for several days and has drank a 6 pack of beer daily for last several days. He is unable to provide much history of what happened today or why he was sitting in his own stool or for how long. Currently reports feeling well and denies any complaints. Denies fever, chills, headache, dizziness, cough, SOB, chest pain, abdominal pain, nausea, vomiting, diarrhea, constipation, dysuria, hematuria, hematochezia, melena, difficulty moving arms/legs, speech difficulty or any other complaints.  In the ED: Patient tachycardic but otherwise vitals stable. Patient covered in feces. Necrotic wounds noted. Sepsis protocol initiated. Patient noted to be very hyponatremic with Na 109 and initially less responsive. ICU consulted and patient ultimately with improved mentation and stable for stepdown unit. Lactate 3.5, CMP with Na 109, K 6.1, Cl 76, CO2 15, glucose 184, Cr 3.67, AST 70, T bili 1.4, anion gap 18. WBC 28.5, Hgb 13.6. Ethanol level 22. CK, Mag WNL. Patient was started on Vanc/Zosyn with IVF and called for admission.  CXR: Left basilar atelectasis. CT  Head and Dopplers LE ordered   Review of Systems:  All other systems negative unless noted above in HPI.   Past Medical History:  Diagnosis Date  . Arthritis    "probably in my hands" (11/24/2017)  . Cellulitis of left foot 11/24/2017  . High cholesterol   . Hypertension   . Osteomyelitis of left foot (HCC)    Osteomyelitis of left hallux with overlying cellulitis left foot/notes 11/24/2017  . Tobacco abuse   . Tuberculosis ~ 1990   "had tx for it"  . Type II diabetes mellitus ([place])   . Wound infection 03/07/2018   LEFT FOOT   Past Surgical History:  Procedure Laterality Date  . AMPUTATION Left 11/25/2017   Procedure: TRANSMETATARSAL AMPUTATION;  Surgeon: Shona Needles, MD;  Location: [facility];  Service: Orthopedics;  Laterality: Left;  . AMPUTATION Left 01/15/2018   Procedure: REVISION TRANS METATARSAL AMPUTATION;  Surgeon: Newt Minion, MD;  Location: [facility];  Service: Orthopedics;  Laterality: Left;   Social History:  reports that he has been smoking cigarettes. He has a 46.00 pack-year smoking history. He has quit using smokeless tobacco.  His smokeless tobacco use included chew. He reports current alcohol use of about 6.0 standard drinks of alcohol per week. He reports current drug use. Drug: Marijuana.  No Known Allergies  Family History  Problem Relation Age of Onset  . Cancer Father   . Cancer Sister   . Diabetes Mellitus II Other        States 6 out of 46 of his siblings     Prior to Admission medications   Medication Sig Start Date End  Date Taking? Authorizing Provider  acetaminophen (TYLENOL) 325 MG tablet Take 1-2 tablets (325-650 mg total) by mouth every 6 (six) hours as needed for mild pain (pain score 1-3 or temp > 100.5). Patient not taking: Reported on 03/07/2018 01/17/18   Calvert Cantor, MD  Amino Acids-Protein Hydrolys (FEEDING SUPPLEMENT, PRO-STAT SUGAR FREE 64,) LIQD Take 30 mLs by mouth 2 (two) times daily. Patient not taking: Reported on 03/18/2018 03/11/18    Maretta Bees, MD  atorvastatin (LIPITOR) 20 MG tablet Take 1 tablet (20 mg total) by mouth daily at 6 PM. 03/28/19   Hoy Register, MD  bisacodyl (DULCOLAX) 10 MG suppository Place 1 suppository (10 mg total) rectally daily as needed for moderate constipation. Patient not taking: Reported on 03/07/2018 01/17/18   Calvert Cantor, MD  Blood Glucose Monitoring Suppl (TRUE METRIX METER) DEVI 1 each by Does not apply route daily. Patient not taking: Reported on 01/13/2018 11/29/17   Scherrie Gerlach, MD  cyclobenzaprine (FLEXERIL) 10 MG tablet Take 1 tablet (10 mg total) by mouth 2 (two) times daily as needed for muscle spasms. 03/28/19   Hoy Register, MD  docusate sodium (COLACE) 100 MG capsule Take 1 capsule (100 mg total) by mouth 2 (two) times daily. Patient not taking: Reported on 03/07/2018 01/17/18   Calvert Cantor, MD  doxycycline (VIBRA-TABS) 100 MG tablet Take 1 tablet (100 mg total) by mouth every 12 (twelve) hours. 03/28/19   Hoy Register, MD  glucose blood (TRUE METRIX BLOOD GLUCOSE TEST) test strip Use as instructed Patient not taking: Reported on 03/18/2018 03/14/18   Hoy Register, MD  HYDROcodone-acetaminophen (NORCO/VICODIN) 5-325 MG tablet Take 1 tablet by mouth every 6 (six) hours as needed for moderate pain (pain score 4-6). Patient not taking: Reported on 03/18/2018 03/11/18   Maretta Bees, MD  hydrocortisone cream 0.5 % Apply 1 application topically 2 (two) times daily. Patient not taking: Reported on 03/28/2019 03/18/18   Hoy Register, MD  lisinopril (ZESTRIL) 10 MG tablet Take 1 tablet (10 mg total) by mouth daily. 04/18/19   Hoy Register, MD  metFORMIN (GLUCOPHAGE) 500 MG tablet Take 1 tablet (500 mg total) by mouth 2 (two) times daily with a meal. 03/28/19   Hoy Register, MD  polyethylene glycol (MIRALAX) packet Take 17 g by mouth daily as needed for mild constipation. Patient not taking: Reported on 03/07/2018 01/17/18   Calvert Cantor, MD  QUEtiapine (SEROQUEL) 50 MG  tablet Take 1 tablet (50 mg total) by mouth at bedtime. Patient not taking: Reported on 03/18/2018 03/11/18   Maretta Bees, MD  silver sulfADIAZINE (SILVADENE) 1 % cream Apply topically daily. Wash left foot with soap and water apply Silvadene plus dry dressing change daily. Patient not taking: Reported on 03/18/2018 03/12/18   Maretta Bees, MD  TRUEPLUS LANCETS 26G MISC Use as directed Patient not taking: Reported on 03/18/2018 03/14/18   Hoy Register, MD   Physical Exam: Vitals:   01/17/2020 1800 01/20/2020 1815 01/07/2020 1845 01/20/2020 1900  BP: (!) 150/91  108/89 (!) 111/92  Pulse:   (!) 104 (!) 106  Resp: (!) 23  20 (!) 21  Temp:      TempSrc:      SpO2:  100% 100% 99%  Weight:      Height:        Wt Readings from Last 3 Encounters:  01/01/2020 77.1 kg  06/02/18 70.3 kg  03/18/18 71.6 kg    General:  Resting in bed. Appears disheveled and frail. Oriented  to person, place and time (with exception of year-believes it is 2018) Eyes: PERRL, normal lids, irises & conjunctiva ENT: grossly normal hearing, lips & tongue; dry mucous membranes Neck: normal ROM Cardiovascular: Tachycardic with regular rhythm, no m/r/g. Edema of LE present. Respiratory: Crackles noted bilaterally in upper and lower lobes. Normal respiratory effort. Abdomen: RUQ diffusely tender to palpation otherwise soft, non-tender Skin: see photos in chart of necrotic lower extremity wounds; hands with scabbing bilaterally Musculoskeletal: grossly normal tone BUE; able to move lower extremities. S/p L foot amputation at transmetatarsal. Lower extremities necrotic-appearing and cool to touch. Per ED physician, dopplers able to obtain pulse in left/right popliteal.  Psychiatric: grossly normal mood and affect, speech fluent and appropriate Neurologic: grossly non-focal.          Labs on Admission:  Basic Metabolic Panel: Recent Labs  Lab 01/21/2020 1832  NA 109*  K 6.1*  CL 76*  CO2 15*  GLUCOSE 184*  BUN  63*  CREATININE 3.67*  CALCIUM 8.1*  MG 1.9   Liver Function Tests: Recent Labs  Lab 01/04/2020 1832  AST 70*  ALT 39  ALKPHOS 132*  BILITOT 1.4*  PROT 6.5  ALBUMIN 2.0*   No results for input(s): LIPASE, AMYLASE in the last 168 hours. No results for input(s): AMMONIA in the last 168 hours. CBC: Recent Labs  Lab 01/10/2020 1832  WBC 28.5*  NEUTROABS 26.9*  HGB 13.6  HCT 38.4*  MCV 93.2  PLT 191   Cardiac Enzymes: Recent Labs  Lab 01/24/2020 1832  CKTOTAL 392    BNP (last 3 results) No results for input(s): BNP in the last 8760 hours.  ProBNP (last 3 results) No results for input(s): PROBNP in the last 8760 hours.  CBG: No results for input(s): GLUCAP in the last 168 hours.  Radiological Exams on Admission: DG Chest Port 1 View  Result Date: 01/24/2020 CLINICAL DATA:  Failure to thrive EXAM: PORTABLE CHEST 1 VIEW COMPARISON:  12/13/2016 FINDINGS: Left basilar atelectasis. No pleural effusion or pneumothorax. Stable cardiomediastinal contours with normal heart size. IMPRESSION: Left basilar atelectasis. Electronically Signed   By: Guadlupe Spanish M.D.   On: 01/08/2020 19:25    EKG: Independently reviewed. HR 105. Sinus tachycardia. QTc 461. No STEMI. 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 Mildly peaked T waves.  Assessment/Plan Principal Problem:   Sepsis (HCC) Active Problems:   Hypertension   Hyponatremia   Tobacco abuse   Psychosis (HCC)   Protein-calorie malnutrition, severe   S/P transmetatarsal amputation of foot, left (HCC)   Diabetes mellitus type 2 in nonobese (HCC)   AKI (acute kidney injury) (HCC)   Failure to thrive syndrome, adult   Multiple open wounds of lower extremity, complicated   Hyperkalemia  64 y.o. male with PMH DM s/p L transmetatarsal amputation, HTN, psychotic disorder, alcohol abuse, tobacco use who presents for failure to thrive and found to have necrotic wounds, sepsis, hyponatremia, AKI.  Sepsis in  setting of chronic wounds  - WBC 28.5, Lactate 3.5 and patient tachycardic on admission with necrotic wounds of lower extremities  - F/u blood cx and UA - Diffuse crackles noted on lung exam but no infiltrates appreciated on CXR - Cont Vanc/Zosyn and fluid rehydration; trend lactate to normal  - Consult Ortho and Vascular in AM for further management of lower extremities  - NPO for possible surgical intervention tomorrow; give diet if not  Acute Renal Failure - Cr 3.67 on admission; previously normal  baseline  - likely in setting of sepsis/hypovolemia - IVF - Trend with BMP - Avoid nephrotoxic agents   Hyponatremia - likely hypovolemia in addition to beer potomania - BMP q4h until resolved; monitor for rapid correction - Cont IVF - Consider urine lytes if not resolving  Hyperkalemia - K 6.1 on admission - Trend with BMP - Mildly peaked T waves on EKG; will give IV Calcium gluconate - Tele - Consider insulin/glucose if not improving with next BMP - should resolve with improved kidney function  Failure to Thrive - patient covered in feces with multiple chronic wounds - unable to care for himself at home - Dietician consulted - Phosphorus level ordered Wt Readings from Last 4 Encounters:  12/26/2019 77.1 kg  06/02/18 70.3 kg  03/18/18 71.6 kg  03/08/18 69.9 kg   DM with Chronic wounds - last A1c 6.5 in May 2019; repeat ordered - PTA Metformin but patient has not been taking; held on admission - SSI  HTN - patient has not been taking any anti-hypertensive - monitor throughout stay; currently septic   Alcohol Abuse/Tobacco Abuse - Currently does not appear intoxicated or signs of withdrawal - initiate CIWA with Ativan PRN - Denies hx of withdrawal seizures, hallucinations - Nicotine patch PRN per patient request   Code Status: Full  DVT Prophylaxis: SCD's; patient likely pre-operative. If not operating 3/4, consider Heparin.  Family Communication: None Disposition  Plan: Admit to Stepdown unit with Tele. Patient is critically ill with multiple severe co-morbidities and at high risk for further decompensation including death. Anticipate discharge in 5-7 days pending possible surgical intervention. Likely unable to discharge back to prior living arrangement (home-independent).  Time spent: 75 minutes  Joselyn Arrow [facility] Pager [phone]

## 2019-12-27 NOTE — ED Provider Notes (Signed)
[name] EMERGENCY DEPARTMENT Provider Note   CSN: [id number] Arrival date & time: 01/24/2020  1736     History Chief Complaint  Patient presents with  . Fatigue    Mark Graham is a 64 y.o. male w/ hx of DM2 s/p partial foot amputation, presenting to the emergency department with failure to thrive.  EMS brought the patient in at the behest of his brother, who came to visit him today and found him sitting in his stool in the living room.  EMS reports the house was filthy with feces everywhere.  They report the patient was caked in his own feces.  They said the patient was minimally verbal.  On arrival the patient does appear somewhat confused.  He stares blankly at me and does whisper intermittently.  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  His brother apparently told EMS he wants the patient "committed because he's not taking care of himself."  Patient cannot really answer any focal questions.  He does follow my commands.  HPI     Past Medical History:  Diagnosis Date  . Arthritis    "probably in my hands" (11/24/2017)  . Cellulitis of left foot 11/24/2017  . High cholesterol   . Hypertension   . Osteomyelitis of left foot (HCC)    Osteomyelitis of left hallux with overlying cellulitis left foot/notes 11/24/2017  . Tobacco abuse   . Tuberculosis ~ 1990   "had tx for it"  . Type II diabetes mellitus (HCC)   . Wound infection 03/07/2018   LEFT FOOT    Patient Active Problem List   Diagnosis Date Noted  . Sepsis (HCC) 01/17/2020  . Failure to thrive syndrome, adult 01/23/2020  . Multiple open wounds of lower extremity, complicated 01/09/2020  . Hyperkalemia 01/23/2020  . Gangrene of extremity (HCC)   . ETOH abuse   . AKI (acute kidney injury) (HCC) 03/08/2018  . HLD (hyperlipidemia) 03/07/2018  . Left foot infection 03/07/2018  . Wound infection after surgery   . Diabetes  mellitus type 2 in nonobese (HCC) 01/14/2018  . Normochromic normocytic anemia 01/14/2018  . Osteomyelitis of left foot (HCC) 01/14/2018  . S/P transmetatarsal amputation of foot, left (HCC)   . Frostbite 11/24/2017  . Cellulitis 11/24/2017  . Depression 02/02/2017  . Insomnia 02/02/2017  . Protein-calorie malnutrition, severe 01/24/2017  . Urinary tract infection without hematuria   . Psychosis (HCC) 12/13/2016  . Weight loss 12/13/2016  . Abnormal ECG 12/13/2016  . Iron overload 12/13/2016  . Elevated bilirubin 12/13/2016  . PSA elevation 12/13/2016  . Diabetes mellitus type 2, controlled, with complications (HCC) 12/12/2016  . Tobacco abuse 10/21/2015  . Hyperglycemia 10/21/2015  . Urinary frequency 10/21/2015  . RUQ abdominal pain 10/21/2015  . Hypertension 03/03/2014  . Hyponatremia 03/03/2014  . Chest pain 03/03/2014  . Leukocytosis 03/03/2014  . Urinary retention 03/03/2014    Past Surgical History:  Procedure Laterality Date  . AMPUTATION Left 11/25/2017   Procedure: TRANSMETATARSAL AMPUTATION;  Surgeon: Roby Lofts, MD;  Location: [facility] OR;  Service: Orthopedics;  Laterality: Left;  . AMPUTATION Left 01/15/2018   Procedure: REVISION TRANS METATARSAL AMPUTATION;  Surgeon: Nadara Mustard, MD;  Location: [facility] OR;  Service: Orthopedics;  Laterality: Left;       Family History  Problem Relation Age of Onset  . Cancer Father   . Cancer Sister   . Diabetes Mellitus  II Other        States 6 out of 13 of his siblings     Social History   Tobacco Use  . Smoking status: Current Every Day Smoker    Packs/day: 1.00    Years: 46.00    Pack years: 46.00    Types: Cigarettes  . Smokeless tobacco: Former Neurosurgeon    Types: Chew  Substance Use Topics  . Alcohol use: Yes    Alcohol/week: 6.0 standard drinks    Types: 6 Cans of beer per week  . Drug use: Yes    Types: Marijuana    Comment: 11/24/2017 "nothing since ~ 2002"    Home Medications Prior to Admission  medications   Medication Sig Start Date End Date Taking? Authorizing Provider  TRUEPLUS LANCETS 26G MISC Use as directed 03/14/18  Yes Hoy Register, MD  Blood Glucose Monitoring Suppl (TRUE METRIX METER) DEVI 1 each by Does not apply route daily. 11/29/17   Scherrie Gerlach, MD  glucose blood (TRUE METRIX BLOOD GLUCOSE TEST) test strip Use as instructed 03/14/18   Hoy Register, MD    Allergies    Patient has no known allergies.  Review of Systems   Review of Systems  Unable to perform ROS: Psychiatric disorder (level 5 caveat)    Physical Exam Updated Vital Signs BP 120/81   Pulse (!) 108   Temp 98.2 F (36.8 C) (Oral)   Resp (!) 21   Ht 5\' 11"  (1.803 m)   Wt 77.1 kg   SpO2 92%   BMI 23.71 kg/m   Physical Exam Vitals and nursing note reviewed.  Constitutional:      Appearance: He is well-developed.  HENT:     Head: Normocephalic and atraumatic.  Eyes:     Conjunctiva/sclera: Conjunctivae normal.  Cardiovascular:     Rate and Rhythm: Regular rhythm. Tachycardia present.     Pulses: Normal pulses.     Comments: Right pedal pulse audible on doppler Left pedal pulse absent Left popliteal pulse audible on duppler Pulmonary:     Effort: Pulmonary effort is normal. No respiratory distress.  Abdominal:     General: There is distension.     Palpations: Abdomen is soft.     Tenderness: There is no abdominal tenderness.  Musculoskeletal:     Cervical back: Neck supple.     Comments: Extensive ulceration of the sacrum and posterior legs (see photo), deep ulceration and sloughing of the bilateral feet, dusky tissue and open wounds in the bilateral feet  Skin:    General: Skin is warm and dry.  Neurological:     Mental Status: He is alert.     GCS: GCS eye subscore is 4. GCS verbal subscore is 5. GCS motor subscore is 6.     Cranial Nerves: No facial asymmetry.              ED Results / Procedures / Treatments   Labs (all labs ordered are listed, but only  abnormal results are displayed) Labs Reviewed  LACTIC ACID, PLASMA - Abnormal; Notable for the following components:      Result Value   Lactic Acid, Venous 3.5 (*)    All other components within normal limits  COMPREHENSIVE METABOLIC PANEL - Abnormal; Notable for the following components:   Sodium 109 (*)    Potassium 6.1 (*)    Chloride 76 (*)    CO2 15 (*)    Glucose, Bld 184 (*)    BUN 63 (*)  Creatinine, Ser 3.67 (*)    Calcium 8.1 (*)    Albumin 2.0 (*)    AST 70 (*)    Alkaline Phosphatase 132 (*)    Total Bilirubin 1.4 (*)    GFR calc non Af Amer 17 (*)    GFR calc Af Amer 19 (*)    Anion gap 18 (*)    All other components within normal limits  CBC WITH DIFFERENTIAL/PLATELET - Abnormal; Notable for the following components:   WBC 28.5 (*)    RBC 4.12 (*)    HCT 38.4 (*)    Neutro Abs 26.9 (*)    Lymphs Abs 0.5 (*)    Abs Immature Granulocytes 0.58 (*)    All other components within normal limits  PROTIME-INR - Abnormal; Notable for the following components:   Prothrombin Time 15.8 (*)    INR 1.3 (*)    All other components within normal limits  ETHANOL - Abnormal; Notable for the following components:   Alcohol, Ethyl (B) 22 (*)    All other components within normal limits  LACTIC ACID, PLASMA - Abnormal; Notable for the following components:   Lactic Acid, Venous 2.8 (*)    All other components within normal limits  HEMOGLOBIN A1C - Abnormal; Notable for the following components:   Hgb A1c MFr Bld 6.5 (*)    All other components within normal limits  PHOSPHORUS - Abnormal; Notable for the following components:   Phosphorus 6.3 (*)    All other components within normal limits  RESPIRATORY PANEL BY RT PCR (FLU A&B, COVID)  CULTURE, BLOOD (ROUTINE X 2)  CULTURE, BLOOD (ROUTINE X 2)  URINE CULTURE  APTT  CK  MAGNESIUM  URINALYSIS, ROUTINE W REFLEX MICROSCOPIC  HIV ANTIBODY (ROUTINE TESTING W REFLEX)  BASIC METABOLIC PANEL  BASIC METABOLIC PANEL  BASIC  METABOLIC PANEL  BASIC METABOLIC PANEL  CBC  BASIC METABOLIC PANEL  BASIC METABOLIC PANEL  POC SARS CORONAVIRUS 2 AG -  ED    EKG EKG Interpretation  Date/Time:  Wednesday 2020/01/20 17:46:28 EST Ventricular Rate:  106 PR Interval:    QRS Duration: 109 QT Interval:  347 QTC Calculation: 461 R Axis:   -63 Text Interpretation: Sinus tachycardia Right atrial enlargement Left anterior fascicular block Borderline low voltage, extremity leads ST elevation, consider anterolateral injury No STEMI Confirmed by Alvester Chou [id number]) on 01/20/20 5:48:30 PM   Radiology DG Chest Port 1 View  Result Date: Jan 20, 2020 CLINICAL DATA:  Failure to thrive EXAM: PORTABLE CHEST 1 VIEW COMPARISON:  12/13/2016 FINDINGS: Left basilar atelectasis. No pleural effusion or pneumothorax. Stable cardiomediastinal contours with normal heart size. IMPRESSION: Left basilar atelectasis. Electronically Signed   By: Guadlupe Spanish M.D.   On: 01/20/2020 19:25   US Abdomen Limited RUQ  Result Date: Jan 20, 2020 CLINICAL DATA:  64 year old male with abdominal pain. EXAM: ULTRASOUND ABDOMEN LIMITED RIGHT UPPER QUADRANT COMPARISON:  Ultrasound dated 01/23/2017. FINDINGS: Gallbladder: There is layering sludge within the gallbladder. No gallbladder wall thickening or pericholecystic fluid. Negative sonographic Murphy's sign. Common bile duct: Diameter: 5 mm Liver: There is diffuse increased liver echogenicity most commonly seen in the setting of fatty infiltration. Superimposed inflammation or fibrosis is not excluded. Clinical correlation is recommended. Portal vein is patent on color Doppler imaging with normal direction of blood flow towards the liver. Other: There is a mild right hydronephrosis. IMPRESSION: 1. Fatty liver. 2. Gallbladder sludge. No sonographic findings of acute cholecystitis. 3. Mild right hydronephrosis. Electronically Signed  By: Anner Crete M.D.   On: 01/18/2020 22:38    Procedures .Critical  Care Performed by: Wyvonnia Dusky, MD Authorized by: Wyvonnia Dusky, MD   Critical care provider statement:    Critical care time (minutes):  50   Critical care was necessary to treat or prevent imminent or life-threatening deterioration of the following conditions:  Sepsis and metabolic crisis   Critical care was time spent personally by me on the following activities:  Discussions with consultants, evaluation of patient's response to treatment, examination of patient, ordering and performing treatments and interventions, ordering and review of laboratory studies, ordering and review of radiographic studies, pulse oximetry, re-evaluation of patient's condition, obtaining history from patient or surrogate and review of old charts Comments:     Sepsis and hyponatremia, requiring IV fluids, IV antibiotics   (including critical care time)  Medications Ordered in ED Medications  thiamine (B-1) injection 100 mg (has no administration in time range)  folic acid injection 1 mg (has no administration in time range)  vancomycin variable dose per unstable renal function (pharmacist dosing) (has no administration in time range)  piperacillin-tazobactam (ZOSYN) IVPB 3.375 g (has no administration in time range)  insulin aspart (novoLOG) injection 0-9 Units (has no administration in time range)  vancomycin (VANCOREADY) IVPB 1500 mg/300 mL (0 mg Intravenous Stopped 01/19/2020 2159)  piperacillin-tazobactam (ZOSYN) IVPB 3.375 g (0 g Intravenous Stopped 01/07/2020 1913)  sodium chloride 0.9 % bolus 2,250 mL (0 mLs Intravenous Stopped 12/28/19 0002)    ED Course  I have reviewed the triage vital signs and the nursing notes.  Pertinent labs & imaging results that were available during my care of the patient were reviewed by me and considered in my medical decision making (see chart for details).  64 yo male presenting with failure to thrive.  Patient is covered in sores, has ulcerations of the rear and  frankly necrotic tissue in the lower extremities.  He is afebrile, but given his clear risk for osteomyelitis, I ordered a full sepsis w/u with blood cultures, and broad antibiotics.  We'll check labs, electrolytes.  He will need admission.  I explained this to him and he nodded understanding.  Clinical Course as of Dec 28 7  Wed Dec 27, 2019  1909 WBC(!): 28.5 [MT]  1930 Sodium(!!): 109 [MT]  1940 ICU E link attending discussed case, will send ground team to evaluate   [MT]  2047 Doppler pulse audible in right foot (pedal), not left foot (near site of amputation, skin necrotic), and palpable weekly in left popliteal fossa.  This appears to be chronic, doubt there is much to be done emergent by vascular surgery at this time.  I'll defer this to the inpatient team.   [MT]  2048 ICU team evaluted the patient and felt it was stable for step-down or tele admission, they found his mental status good (it improved when I spoke to him again) and oriented.  He drinks beer daily.  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   [MT]  2110 Signout given to hospitalist   [MT]    Clinical Course User Index [MT] Wyvonnia Dusky, MD    Final Clinical Impression(s) / ED Diagnoses Final diagnoses:  Hyponatremia  Sepsis, due to unspecified organism, unspecified whether acute organ dysfunction present [facility])  Frostbite with tissue necrosis of right foot, initial encounter  Frostbite with tissue necrosis of left foot, initial encounter  Skin ulcer, unspecified ulcer  stage (HCC)  Hyperkalemia  Acute kidney injury [facility])    Rx / DC Orders ED Discharge Orders    None       [place] Flett, Kermit Balo, MD 12/28/19 [phone]

## 2019-12-28 ENCOUNTER — Encounter (HOSPITAL_COMMUNITY): Payer: Medicaid Other

## 2019-12-28 ENCOUNTER — Inpatient Hospital Stay (HOSPITAL_COMMUNITY): Payer: Medicaid Other

## 2019-12-28 DIAGNOSIS — E1152 Type 2 diabetes mellitus with diabetic peripheral angiopathy with gangrene: Secondary | ICD-10-CM

## 2019-12-28 DIAGNOSIS — F10239 Alcohol dependence with withdrawal, unspecified: Secondary | ICD-10-CM

## 2019-12-28 DIAGNOSIS — I96 Gangrene, not elsewhere classified: Secondary | ICD-10-CM

## 2019-12-28 DIAGNOSIS — Z720 Tobacco use: Secondary | ICD-10-CM

## 2019-12-28 DIAGNOSIS — R7989 Other specified abnormal findings of blood chemistry: Secondary | ICD-10-CM

## 2019-12-28 DIAGNOSIS — E43 Unspecified severe protein-calorie malnutrition: Secondary | ICD-10-CM

## 2019-12-28 DIAGNOSIS — E871 Hypo-osmolality and hyponatremia: Secondary | ICD-10-CM

## 2019-12-28 DIAGNOSIS — E875 Hyperkalemia: Secondary | ICD-10-CM

## 2019-12-28 DIAGNOSIS — G9341 Metabolic encephalopathy: Secondary | ICD-10-CM

## 2019-12-28 DIAGNOSIS — R4182 Altered mental status, unspecified: Secondary | ICD-10-CM

## 2019-12-28 DIAGNOSIS — S81809A Unspecified open wound, unspecified lower leg, initial encounter: Secondary | ICD-10-CM

## 2019-12-28 DIAGNOSIS — E872 Acidosis: Secondary | ICD-10-CM

## 2019-12-28 DIAGNOSIS — I10 Essential (primary) hypertension: Secondary | ICD-10-CM

## 2019-12-28 LAB — BASIC METABOLIC PANEL
Anion gap: 14 (ref 5–15)
Anion gap: 15 (ref 5–15)
Anion gap: 15 (ref 5–15)
BUN: 62 mg/dL — ABNORMAL HIGH (ref 8–23)
BUN: 63 mg/dL — ABNORMAL HIGH (ref 8–23)
BUN: 72 mg/dL — ABNORMAL HIGH (ref 8–23)
CO2: 17 mmol/L — ABNORMAL LOW (ref 22–32)
CO2: 15 mmol/L — ABNORMAL LOW (ref 22–32)
CO2: 16 mmol/L — ABNORMAL LOW (ref 22–32)
Calcium: 7.4 mg/dL — ABNORMAL LOW (ref 8.9–10.3)
Calcium: 7 mg/dL — ABNORMAL LOW (ref 8.9–10.3)
Calcium: 7.4 mg/dL — ABNORMAL LOW (ref 8.9–10.3)
Chloride: 84 mmol/L — ABNORMAL LOW (ref 98–111)
Chloride: 86 mmol/L — ABNORMAL LOW (ref 98–111)
Chloride: 86 mmol/L — ABNORMAL LOW (ref 98–111)
Creatinine, Ser: 3.36 mg/dL — ABNORMAL HIGH (ref 0.61–1.24)
Creatinine, Ser: 3.43 mg/dL — ABNORMAL HIGH (ref 0.61–1.24)
Creatinine, Ser: 3.88 mg/dL — ABNORMAL HIGH (ref 0.61–1.24)
GFR calc Af Amer: 21 mL/min — ABNORMAL LOW (ref >60)
GFR calc Af Amer: 21 mL/min — ABNORMAL LOW (ref >60)
GFR calc Af Amer: 18 mL/min — ABNORMAL LOW (ref >60)
GFR calc non Af Amer: 18 mL/min — ABNORMAL LOW (ref >60)
GFR calc non Af Amer: 18 mL/min — ABNORMAL LOW (ref >60)
GFR calc non Af Amer: 15 mL/min — ABNORMAL LOW (ref >60)
Glucose, Bld: 117 mg/dL — ABNORMAL HIGH (ref 70–99)
Glucose, Bld: 110 mg/dL — ABNORMAL HIGH (ref 70–99)
Glucose, Bld: 93 mg/dL (ref 70–99)
Potassium: 5.9 mmol/L — ABNORMAL HIGH (ref 3.5–5.1)
Potassium: 5.9 mmol/L — ABNORMAL HIGH (ref 3.5–5.1)
Potassium: 6.2 mmol/L — ABNORMAL HIGH (ref 3.5–5.1)
Sodium: 115 mmol/L — CL (ref 135–145)
Sodium: 116 mmol/L — CL (ref 135–145)
Sodium: 117 mmol/L — CL (ref 135–145)

## 2019-12-28 LAB — PHOSPHORUS: Phosphorus: 6.7 mg/dL — ABNORMAL HIGH (ref 2.5–4.6)

## 2019-12-28 LAB — BLOOD CULTURE ID PANEL (REFLEXED)

## 2019-12-28 LAB — CBC
HCT: 31.7 % — ABNORMAL LOW (ref 39.0–52.0)
Hemoglobin: 11.1 g/dL — ABNORMAL LOW (ref 13.0–17.0)
MCH: 32.3 pg (ref 26.0–34.0)
MCHC: 35 g/dL (ref 30.0–36.0)
MCV: 92.2 fL (ref 80.0–100.0)
Platelets: 136 10*3/uL — ABNORMAL LOW (ref 150–400)
RBC: 3.44 MIL/uL — ABNORMAL LOW (ref 4.22–5.81)
RDW: 13.4 % (ref 11.5–15.5)
WBC: 21.3 10*3/uL — ABNORMAL HIGH (ref 4.0–10.5)
nRBC: 0 % (ref 0.0–0.2)

## 2019-12-28 LAB — URINALYSIS, ROUTINE W REFLEX MICROSCOPIC
Bilirubin Urine: NEGATIVE
Glucose, UA: NEGATIVE mg/dL
Ketones, ur: NEGATIVE mg/dL
Nitrite: NEGATIVE
Protein, ur: 30 mg/dL — AB
Specific Gravity, Urine: 1.015 (ref 1.005–1.030)
pH: 5 (ref 5.0–8.0)

## 2019-12-28 LAB — CBG MONITORING, ED
Glucose-Capillary: 83 mg/dL (ref 70–99)
Glucose-Capillary: 86 mg/dL (ref 70–99)

## 2019-12-28 LAB — GLUCOSE, CAPILLARY
Glucose-Capillary: 34 mg/dL — CL (ref 70–99)
Glucose-Capillary: 162 mg/dL — ABNORMAL HIGH (ref 70–99)
Glucose-Capillary: 141 mg/dL — ABNORMAL HIGH (ref 70–99)

## 2019-12-28 LAB — HIV ANTIBODY (ROUTINE TESTING W REFLEX): HIV Screen 4th Generation wRfx: NONREACTIVE

## 2019-12-28 LAB — MAGNESIUM: Magnesium: 1.7 mg/dL (ref 1.7–2.4)

## 2019-12-28 MED ORDER — INSULIN ASPART 100 UNIT/ML ~~LOC~~ SOLN: 0.0000 [IU] | SUBCUTANEOUS | Status: DC

## 2019-12-28 MED ORDER — THIAMINE HCL 100 MG/ML IJ SOLN
Freq: Three times a day (TID) | INTRAVENOUS | Status: AC
  Filled 2019-12-28: qty 1000

## 2019-12-28 MED ORDER — DEXTROSE 50 % IV SOLN
1.0000 | Freq: Once | INTRAVENOUS | Status: AC
  Administered 2019-12-28: 50 mL via INTRAVENOUS
  Filled 2019-12-28: qty 50

## 2019-12-28 MED ORDER — THIAMINE HCL 100 MG/ML IJ SOLN
250.0000 mg | Freq: Every day | INTRAVENOUS | Status: DC
  Filled 2019-12-28: qty 2.5

## 2019-12-28 MED ORDER — ORAL CARE MOUTH RINSE
15.0000 mL | Freq: Two times a day (BID) | OROMUCOSAL | Status: DC
  Administered 2019-12-29 – 2019-12-30 (×4): 15 mL via OROMUCOSAL

## 2019-12-28 MED ORDER — SODIUM CHLORIDE 0.9 % IV SOLN
2.0000 g | INTRAVENOUS | Status: DC
  Administered 2019-12-28: 2 g via INTRAVENOUS
  Filled 2019-12-28 (×2): qty 2

## 2019-12-28 MED ORDER — ADULT MULTIVITAMIN W/MINERALS CH: 1.0000 | ORAL_TABLET | Freq: Every day | ORAL | Status: DC

## 2019-12-28 MED ORDER — NICOTINE 21 MG/24HR TD PT24
21.0000 mg | MEDICATED_PATCH | Freq: Every day | TRANSDERMAL | Status: DC
  Administered 2019-12-29 – 2019-12-30 (×2): 21 mg via TRANSDERMAL
  Filled 2019-12-28 (×2): qty 1

## 2019-12-28 MED ORDER — METRONIDAZOLE IN NACL 5-0.79 MG/ML-% IV SOLN
500.0000 mg | Freq: Three times a day (TID) | INTRAVENOUS | Status: DC
  Administered 2019-12-28 – 2019-12-30 (×5): 500 mg via INTRAVENOUS
  Filled 2019-12-28 (×5): qty 100

## 2019-12-28 MED ORDER — CHLORHEXIDINE GLUCONATE 0.12 % MT SOLN
15.0000 mL | Freq: Two times a day (BID) | OROMUCOSAL | Status: DC
  Administered 2019-12-28 – 2019-12-30 (×5): 15 mL via OROMUCOSAL
  Filled 2019-12-28 (×4): qty 15

## 2019-12-28 MED ORDER — HEPARIN SODIUM (PORCINE) 5000 UNIT/ML IJ SOLN
5000.0000 [IU] | Freq: Three times a day (TID) | INTRAMUSCULAR | Status: DC
  Administered 2019-12-28 – 2019-12-30 (×4): 5000 [IU] via SUBCUTANEOUS
  Filled 2019-12-28 (×4): qty 1

## 2019-12-28 MED ORDER — DEXTROSE 50 % IV SOLN
25.0000 g | INTRAVENOUS | Status: AC
  Administered 2019-12-28: 25 g via INTRAVENOUS

## 2019-12-28 MED ORDER — THIAMINE HCL 100 MG/ML IJ SOLN
250.0000 mg | Freq: Every day | INTRAVENOUS | Status: DC
  Administered 2019-12-29 – 2019-12-30 (×2): 250 mg via INTRAVENOUS
  Filled 2019-12-28 (×3): qty 2.5

## 2019-12-28 MED ORDER — INSULIN ASPART 100 UNIT/ML ~~LOC~~ SOLN
0.0000 [IU] | Freq: Three times a day (TID) | SUBCUTANEOUS | Status: DC
  Administered 2019-12-29 – 2019-12-30 (×4): 1 [IU] via SUBCUTANEOUS

## 2019-12-28 MED ORDER — SODIUM CHLORIDE 0.9 % IV SOLN: INTRAVENOUS | Status: DC

## 2019-12-28 MED ORDER — CALCIUM GLUCONATE-NACL 1-0.675 GM/50ML-% IV SOLN
1.0000 g | Freq: Once | INTRAVENOUS | Status: AC
  Administered 2019-12-28: 22:00:00 1000 mg via INTRAVENOUS
  Filled 2019-12-28: qty 50

## 2019-12-28 MED ORDER — CALCIUM GLUCONATE-NACL 1-0.675 GM/50ML-% IV SOLN
1.0000 g | Freq: Once | INTRAVENOUS | Status: AC
  Administered 2019-12-28: 1000 mg via INTRAVENOUS
  Filled 2019-12-28: qty 50

## 2019-12-28 MED ORDER — CHLORHEXIDINE GLUCONATE CLOTH 2 % EX PADS
6.0000 | MEDICATED_PAD | Freq: Every day | CUTANEOUS | Status: DC
  Administered 2019-12-29: 6 via TOPICAL

## 2019-12-28 MED ORDER — INSULIN ASPART 100 UNIT/ML IV SOLN
10.0000 [IU] | Freq: Once | INTRAVENOUS | Status: AC
  Administered 2019-12-28: 10 [IU] via INTRAVENOUS

## 2019-12-28 MED ORDER — FOLIC ACID 1 MG PO TABS: 1.0000 mg | ORAL_TABLET | Freq: Every day | ORAL | Status: DC

## 2019-12-28 MED ORDER — GERHARDT'S BUTT CREAM
TOPICAL_CREAM | CUTANEOUS | Status: DC | PRN
  Filled 2019-12-28: qty 1

## 2019-12-28 MED ORDER — LORAZEPAM 1 MG PO TABS: 1.0000 mg | ORAL_TABLET | ORAL | Status: DC | PRN

## 2019-12-28 MED ORDER — THIAMINE HCL 100 MG/ML IJ SOLN
Freq: Once | INTRAVENOUS | Status: AC
  Filled 2019-12-28: qty 1000

## 2019-12-28 MED ORDER — LORAZEPAM 2 MG/ML IJ SOLN
1.0000 mg | INTRAMUSCULAR | Status: DC | PRN
  Administered 2019-12-30: 2 mg via INTRAVENOUS
  Filled 2019-12-28: qty 1

## 2019-12-28 MED ORDER — DEXTROSE 50 % IV SOLN
INTRAVENOUS | Status: AC
  Filled 2019-12-28: qty 50

## 2019-12-28 MED ORDER — COLLAGENASE 250 UNIT/GM EX OINT
TOPICAL_OINTMENT | Freq: Three times a day (TID) | CUTANEOUS | Status: DC
  Filled 2019-12-28: qty 30

## 2019-12-28 MED ORDER — DAKINS (1/4 STRENGTH) 0.125 % EX SOLN
Freq: Three times a day (TID) | CUTANEOUS | Status: DC
  Administered 2019-12-29: 1
  Filled 2019-12-28 (×2): qty 473

## 2019-12-28 NOTE — Progress Notes (Addendum)
PHARMACY - PHYSICIAN COMMUNICATION CRITICAL VALUE ALERT - BLOOD CULTURE IDENTIFICATION (BCID)  Mark Graham is an 64 y.o. male who presented to [facility] on 01/14/2020 with a chief complaint of fatigue.  Assessment: Gram negative rod growing in 1/4 bottles, BCID identified E. coli, no KPC detected.  Name of physician (or Provider) Contacted: Dr. Morene Antu and Dr. Alanda Slim  Current antibiotics: vancomycin, Zosyn  Changes to prescribed antibiotics recommended:  Narrow Zosyn to CTX 2 gm IV q24h. Would avoid vanc/Zosyn combo given AKI. After speaking to Dr. Alanda Slim, will keep pt on broad coverage for 24h but will switch Zosyn to cefepime/Flagyl.   Results for orders placed or performed during the hospital encounter of 01/16/2020  Blood Culture ID Panel (Reflexed) (Collected: 01/08/2020  6:11 PM)  Result Value Ref Range   Enterococcus species NOT DETECTED NOT DETECTED   Listeria monocytogenes NOT DETECTED NOT DETECTED   Staphylococcus species NOT DETECTED NOT DETECTED   Staphylococcus aureus (BCID) NOT DETECTED NOT DETECTED   Streptococcus species NOT DETECTED NOT DETECTED   Streptococcus agalactiae NOT DETECTED NOT DETECTED   Streptococcus pneumoniae NOT DETECTED NOT DETECTED   Streptococcus pyogenes NOT DETECTED NOT DETECTED   Acinetobacter baumannii NOT DETECTED NOT DETECTED   Enterobacteriaceae species DETECTED (A) NOT DETECTED   Enterobacter cloacae complex NOT DETECTED NOT DETECTED   Escherichia coli DETECTED (A) NOT DETECTED   Klebsiella oxytoca NOT DETECTED NOT DETECTED   Klebsiella pneumoniae NOT DETECTED NOT DETECTED   Proteus species NOT DETECTED NOT DETECTED   Serratia marcescens NOT DETECTED NOT DETECTED   Carbapenem resistance NOT DETECTED NOT DETECTED   Haemophilus influenzae NOT DETECTED NOT DETECTED   Neisseria meningitidis NOT DETECTED NOT DETECTED   Pseudomonas aeruginosa NOT DETECTED NOT DETECTED   Candida albicans NOT DETECTED NOT DETECTED   Candida glabrata NOT DETECTED NOT  DETECTED   Candida krusei NOT DETECTED NOT DETECTED   Candida parapsilosis NOT DETECTED NOT DETECTED   Candida tropicalis NOT DETECTED NOT DETECTED    Lulu Riding, PharmD PGY1 Pharmacy Resident  Please check AMION for all [facility] Pharmacy phone numbers After 10:00 PM, call Main Pharmacy [phone]  12/28/2019  1:40 PM

## 2019-12-28 NOTE — Progress Notes (Signed)
@  2030 paged Bodenheimer,NP on call for attending regarding pt's hyperkalemia (6.2 with peaked T-waves),elevated Lactic Acid (2.8 at last draw), hypothermia (Foley Temp of 95 F), and earlier hypoglycemia (CBG in 30s on presentation to unit). Page promptly returned and orders received and administered. Will continue to follow.

## 2019-12-28 NOTE — Progress Notes (Signed)
PROGRESS NOTE  Mark Graham YWV:[id number] DOB: 12/09/1955   PCP: Hoy Register, MD  Patient is from: Home.  Lives alone.  DOA: 01/22/2020 LOS: 1  Brief Narrative / Interim history: 64 year old male with history of DM-2, alcohol abuse, tobacco use, psychotic disorder, ambulatory dysfunction and HTN presenting with altered mental status after found sitting in how own stool by his brother for 48 minutes.  He was admitted with sepsis due to necrotic bilateral lower extremity wounds, hyponatremia, AKI, hyperkalemia, alcohol intoxication/withdrawal and metabolic encephalopathy. Reportedly drinks about a sixpack of beer daily but hasn't eaten in a while.  In ED, slightly tachycardic and tachypneic.  Mental status improved.  WBC 28.  Na 109.  K6.1.  CO2 15.  AG 18.  Glucose 184.   Cr 3.67 (normal baseline).  AST 70.  T bili 1.4.  Internal level 22.  CK and magnesium within normal.  Blood cultures obtained.  CXR with left basilar atelectasis. RUQ [place] with fatty liver, gallbladder sludge and mild right hydronephrosis.  Started on vancomycin and Zosyn with IV fluid. PCCM consulted but felt stepdown bed is appropriate.  Subjective: Patient is somewhat sleepy this morning.  Wakes to voice but not able to provide history.  Barely follows command.  Does not appear to be in distress.  He is on 2 L by [place].  Objective: Vitals:   12/28/19 0715 12/28/19 0745 12/28/19 0814 12/28/19 0830  BP: 96/69 99/70 100/70 90/68  Pulse: (!) 102 (!) 104 (!) 101 (!) 103  Resp: (!) 24 (!) 24 (!) 22 (!) 22  Temp:      TempSrc:      SpO2: 97% 97% 96% 99%  Weight:      Height:        Intake/Output Summary (Last 24 hours) at 12/28/2019 1033 Last data filed at 12/28/2019 0919 Gross per 24 hour  Intake 400 ml  Output --  Net 400 ml   Filed Weights   01/21/2020 1758  Weight: 77.1 kg    Examination:  GENERAL: Disheveled.  Chronically ill-appearing. HEENT: MMM.  Vision and hearing grossly intact.  NECK: Supple.  No  apparent JVD.  RESP: On 2 L by [place].  No IWOB.  Fair aeration bilaterally. CVS: Tachycardic to 100s.Heart sounds normal.  ABD/GI/GU: Bowel sounds present. Soft.  Diffuse tenderness to palpation. MSK/EXT: s/p transmetatarsal amputation of left foot. BLE swelling with gangrenous changes with not apparent drainage. SKIN: Gangrenous changes over BLE.  Cold to touch.  No palpable DP pulses bilaterally.  See pictures below for more. NEURO: Sleepy.  Wakes to voice briefly but not answering questions.  Barely follows command. PSYCH: Sleepy.  No distress or agitation.      Procedures:  None  Assessment & Plan: Severe sepsis secondary to gangrenous BLE with multiple wounds and patient with poorly controlled diabetes: POA.  Still with AKI, encephalopathy, tachycardia, tachypnea and leukocytosis.  Lactic acidosis improved. -Continue vancomycin and Zosyn -Follow cultures -Follow lower extremity arterial and venous duplex -Vascular surgery consulted -WOC consult  Acute renal failure with azotemia: Cr 3.67 on admit.  Baseline within normal.  Likely due to dehydration and sepsis -Continue IV fluid -Continue monitoring  Acute metabolic encephalopathy due to sepsis, alcohol intoxication, hyponatremia and renal failure -Treat treatable causes -Frequent orientation delirium precautions  Hyponatremia: Likely chronic from alcohol use.  Improving. -Continue monitoring every 6 hours  Hyperkalemia: Likely due to renal failure.  Received calcium gluconate improving. -Continue monitoring  Controlled DM-2 with hyperglycemia and lower extremity  wounds: A1c 6.5%. Recent Labs    12/28/19 0745  GLUCAP 83  -Continue SSI   Alcohol use disorder with severe dependence: Reportedly drinks about 6 packs a day.  EtOH level 22 on admission. -CIWA monitoring with Ativan -Banana bag and multivitamins -TOC consulted  Lactic acidosis: Likely due to sepsis, alcohol and renal failure.  Improved. -Continue IV  fluid -Treat treatable causes.  Anion gap metabolic acidosis: Likely due to sepsis, alcoholic renal failure. -Management as above  Tobacco use disorder: Reportedly smokes about a pack a day -Nicotine patch -Encourage cessation when able  Essential hypertension: Normotensive for most part. -IV fluid as above  Debility/physical deconditioning/inability to care for self -PT/OT -Consulted TOC  Failure to thrive -Dietitian consulted  Thrombocytopenia -continue monitoring  Pyuria:  -Already on antibiotic. -Follow urine cultures.               DVT prophylaxis: Start subcu heparin Code Status: Full code Family Communication: Updated patient's brother, Mark Graham over the phone  Discharge barrier: Severe sepsis with significant electrolyte derangement and encephalopathy Patient is from: Home. Final disposition: Likely SNF when medically stable.  Consultants: Vascular surgery   Microbiology summarized: [id number] negative. Influenza PCR negative. Blood cultures negative so far. Urine culture pending.  Sch Meds:  Scheduled Meds: . folic acid  1 mg Intravenous Daily  . insulin aspart  0-9 Units Subcutaneous TID WC  . thiamine injection  100 mg Intravenous Daily  . vancomycin variable dose per unstable renal function (pharmacist dosing)   Does not apply See admin instructions   Continuous Infusions: . piperacillin-tazobactam (ZOSYN)  IV Stopped (12/28/19 0658)   PRN Meds:.  Antimicrobials: Anti-infectives (From admission, onward)   Start     Dose/Rate Route Frequency Ordered Stop   12/28/19 0245  piperacillin-tazobactam (ZOSYN) IVPB 3.375 g     3.375 g 12.5 mL/hr over 240 Minutes Intravenous Every 8 hours Jan 21, 2020 2048     2020-01-21 2042  vancomycin variable dose per unstable renal function (pharmacist dosing)      Does not apply See admin instructions Jan 21, 2020 2042     2020/01/21 1815  vancomycin (VANCOREADY) IVPB 1500 mg/300 mL     1,500 mg 150 mL/hr over 120  Minutes Intravenous  Once Jan 21, 2020 1810 January 21, 2020 2159   01-21-20 1815  piperacillin-tazobactam (ZOSYN) IVPB 3.375 g     3.375 g 100 mL/hr over 30 Minutes Intravenous  Once Jan 21, 2020 1810 01-21-20 1913       I have personally reviewed the following labs and images: CBC: Recent Labs  Lab 21-Jan-2020 1832 12/28/19 0420  WBC 28.5* 21.3*  NEUTROABS 26.9*  --   HGB 13.6 11.1*  HCT 38.4* 31.7*  MCV 93.2 92.2  PLT 191 136*   BMP &GFR Recent Labs  Lab Jan 21, 2020 1832 12/28/19 0134 12/28/19 0420  NA 109* 115* 116*  K 6.1* 5.9* 5.9*  CL 76* 84* 86*  CO2 15* 17* 15*  GLUCOSE 184* 117* 110*  BUN 63* 62* 63*  CREATININE 3.67* 3.36* 3.43*  CALCIUM 8.1* 7.4* 7.0*  MG 1.9  --   --   PHOS 6.3*  --   --    Estimated Creatinine Clearance: 23.5 mL/min (A) (by C-G formula based on SCr of 3.43 mg/dL (H)). Liver & Pancreas: Recent Labs  Lab Jan 21, 2020 1832  AST 70*  ALT 39  ALKPHOS 132*  BILITOT 1.4*  PROT 6.5  ALBUMIN 2.0*   No results for input(s): LIPASE, AMYLASE in the last 168 hours. No results for input(s):  AMMONIA in the last 168 hours. Diabetic: Recent Labs    12/30/2019 1832  HGBA1C 6.5*   Recent Labs  Lab 12/28/19 0745  GLUCAP 83   Cardiac Enzymes: Recent Labs  Lab 01/05/2020 1832  CKTOTAL 392   No results for input(s): PROBNP in the last 8760 hours. Coagulation Profile: Recent Labs  Lab 01/06/2020 1832  INR 1.3*   Thyroid Function Tests: No results for input(s): TSH, T4TOTAL, FREET4, T3FREE, THYROIDAB in the last 72 hours. Lipid Profile: No results for input(s): CHOL, HDL, LDLCALC, TRIG, CHOLHDL, LDLDIRECT in the last 72 hours. Anemia Panel: No results for input(s): VITAMINB12, FOLATE, FERRITIN, TIBC, IRON, RETICCTPCT in the last 72 hours. Urine analysis:    Component Value Date/Time   COLORURINE AMBER (A) 12/28/2019 0420   APPEARANCEUR HAZY (A) 12/28/2019 0420   LABSPEC 1.015 12/28/2019 0420   PHURINE 5.0 12/28/2019 0420   GLUCOSEU NEGATIVE 12/28/2019  0420   HGBUR MODERATE (A) 12/28/2019 0420   BILIRUBINUR NEGATIVE 12/28/2019 0420   KETONESUR NEGATIVE 12/28/2019 0420   PROTEINUR 30 (A) 12/28/2019 0420   UROBILINOGEN 1.0 01/15/2015 1946   NITRITE NEGATIVE 12/28/2019 0420   LEUKOCYTESUR MODERATE (A) 12/28/2019 0420   Sepsis Labs: Invalid input(s): PROCALCITONIN, LACTICIDVEN  Microbiology: Recent Results (from the past 240 hour(s))  Blood Culture (routine x 2)     Status: None (Preliminary result)   Collection Time: 01/21/2020  6:11 PM   Specimen: BLOOD LEFT ARM  Result Value Ref Range Status   Specimen Description BLOOD LEFT ARM  Final   Special Requests   Final    BOTTLES DRAWN AEROBIC AND ANAEROBIC Blood Culture adequate volume   Culture   Final    NO GROWTH < 24 HOURS Performed at [facility], [address] [place], [place] [address]    Report Status PENDING  Incomplete  Blood Culture (routine x 2)     Status: None (Preliminary result)   Collection Time: 01/16/2020  6:32 PM   Specimen: BLOOD  Result Value Ref Range Status   Specimen Description BLOOD SITE NOT SPECIFIED  Final   Special Requests   Final    BOTTLES DRAWN AEROBIC AND ANAEROBIC Blood Culture adequate volume   Culture   Final    NO GROWTH < 24 HOURS Performed at [facility], [address], [place], [place] [address]    Report Status PENDING  Incomplete  Respiratory Panel by RT PCR (Flu A&B, Covid) - Nasopharyngeal Swab     Status: None   Collection Time: 01/20/2020  7:44 PM   Specimen: Nasopharyngeal Swab  Result Value Ref Range Status   SARS Coronavirus 2 by RT PCR NEGATIVE NEGATIVE Final    Comment: (NOTE) SARS-CoV-2 target nucleic acids are NOT DETECTED. The SARS-CoV-2 RNA is generally detectable in upper respiratoy specimens during the acute phase of infection. The lowest concentration of SARS-CoV-2 viral copies this assay can detect is 131 copies/mL. A negative result does not preclude SARS-Cov-2 infection and should not be used as the  sole basis for treatment or other patient management decisions. A negative result may occur with  improper specimen collection/handling, submission of specimen other than nasopharyngeal swab, presence of viral mutation(s) within the areas targeted by this assay, and inadequate number of viral copies (<131 copies/mL). A negative result must be combined with clinical observations, patient history, and epidemiological information. The expected result is Negative. Fact Sheet for Patients:  https://www.moore.com/ Fact Sheet for Healthcare Providers:  https://www.young.biz/ This test is not  yet ap proved or cleared by the [place] and  has been authorized for detection and/or diagnosis of SARS-CoV-2 by FDA under an Emergency Use Authorization (EUA). This EUA will remain  in effect (meaning this test can be used) for the duration of the COVID-19 declaration under Section 564(b)(1) of the Act, 21 U.S.C. section 360bbb-3(b)(1), unless the authorization is terminated or revoked sooner.    Influenza A by PCR NEGATIVE NEGATIVE Final   Influenza B by PCR NEGATIVE NEGATIVE Final    Comment: (NOTE) The Xpert Xpress SARS-CoV-2/FLU/RSV assay is intended as an aid in  the diagnosis of influenza from Nasopharyngeal swab specimens and  should not be used as a sole basis for treatment. Nasal washings and  aspirates are unacceptable for Xpert Xpress SARS-CoV-2/FLU/RSV  testing. Fact Sheet for Patients: https://www.moore.com/ Fact Sheet for Healthcare Providers: https://www.young.biz/ This test is not yet approved or cleared by the [place] FDA and  has been authorized for detection and/or diagnosis of SARS-CoV-2 by  FDA under an Emergency Use Authorization (EUA). This EUA will remain  in effect (meaning this test can be used) for the duration of the  Covid-19 declaration under Section 564(b)(1) of the Act, 21   U.S.C. section 360bbb-3(b)(1), unless the authorization is  terminated or revoked. Performed at [facility], [address], [place], [place] [address]     Radiology Studies: DG Chest Port 1 View  Result Date: 01/20/2020 CLINICAL DATA:  Failure to thrive EXAM: PORTABLE CHEST 1 VIEW COMPARISON:  12/13/2016 FINDINGS: Left basilar atelectasis. No pleural effusion or pneumothorax. Stable cardiomediastinal contours with normal heart size. IMPRESSION: Left basilar atelectasis. Electronically Signed   By: Guadlupe Spanish M.D.   On: 01/08/2020 19:25   US Abdomen Limited RUQ  Result Date: 01/08/2020 CLINICAL DATA:  64 year old male with abdominal pain. EXAM: ULTRASOUND ABDOMEN LIMITED RIGHT UPPER QUADRANT COMPARISON:  Ultrasound dated 01/23/2017. FINDINGS: Gallbladder: There is layering sludge within the gallbladder. No gallbladder wall thickening or pericholecystic fluid. Negative sonographic Murphy's sign. Common bile duct: Diameter: 5 mm Liver: There is diffuse increased liver echogenicity most commonly seen in the setting of fatty infiltration. Superimposed inflammation or fibrosis is not excluded. Clinical correlation is recommended. Portal vein is patent on color Doppler imaging with normal direction of blood flow towards the liver. Other: There is a mild right hydronephrosis. IMPRESSION: 1. Fatty liver. 2. Gallbladder sludge. No sonographic findings of acute cholecystitis. 3. Mild right hydronephrosis. Electronically Signed   By: Elgie Collard M.D.   On: 01/11/2020 22:38       Vanshika Jastrzebski T. Derin Granquist [facility]  If 7PM-7AM, please contact night-coverage [email or web address] Password [facility] 12/28/2019, 10:33 AM

## 2019-12-28 NOTE — Progress Notes (Addendum)
Patient arrived to 4E from ED at approx 1510.  Patient noted soiled in urine and feces with open, draining wounds & abdomen distended and tender.  Foley catheter inserted and 1900 cc of dark brown, cloudy urine with sediment & clots noted.  Vital signs taken,CCMD monitoring initiated, CBG noted to be 34.  Bathed with soap and water in addition to CHG wipes.  Transferred to air mattress.   Extensive wounds noted on lower extremities, hands, groin, genitals, sacrum, and buttocks.  Multiple areas draining small amounts of serous, serosanguineous and sanguineous fluid.

## 2019-12-28 NOTE — Consult Note (Addendum)
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  Bilateral lower extremities are cold, blue-tinged and with tissue necrosis in large areas.  Consults for Vascular and Orthopedic Surgery have been recommended and placed.  Patient has been NPO for possible surgeries related to limb salvage. Wound type: IAD plus unrelieved pressure for extended period of time. See intake H&P. Pressure Injury POA: Yes Measurement:Buttock involvement is approximately 28cm x 20cm.  Large area of eschar on left buttock measures 11cm x 7cm Wound bed:See above.  Denuded areas are bleeding, eschar is soft with strong odor consistent with nonviable tissue. Drainage (amount, consistency, odor) See above. Serous and serosanguinous. Periwound: very dry, poor hygiene Dressing procedure/placement/frequency: I have provided guidance for Nursing via the Orders for a mattress replacement with low air loss feature, turning and repositioning, topical care to the buttocks using sodium hypochlorite dressings TID for 3 days followed by collagenase ointment and normal saline dressings thereafter.  I have ordered hydrotherapy by PT to begin on Monday and defer to their expertise for frequency-either daily for 6 days for one week or 3 times a week.  Lower extremity care guidance is deferred to the consulting surgeons. If no improvement with hydrotherapy and consistent with GOC, consider consulting CCS for debridement of the buttock lesions.   WOC nursing team will follow with hydro, and will remain available to this patient, the nursing and medical teams.  Thanks, Ladona Mow, MSN, RN, GNP, [facility] [place]  Pager# [phone]

## 2019-12-28 NOTE — Progress Notes (Signed)
Pharmacy Antibiotic Note  Mark Graham is a 64 y.o. male admitted on 01/21/2020 with sepsis. Pt was started on vanc/Zosyn. Pharmacy consulted to dose cefepime to avoid nephrotoxicity from Zosyn.  He was admitted with failure to thrive. He was found sitting in his own feces and per family, he has a history of not taking care of himself. Photos of skin wounds appear severe and can be seen in the ED provider's note from 3/3. WBC is down to 21.3 and he is afebrile. Lactic acid is down 2.8 and Scr is 3.43 (Baseline 0.8-1.2).  Plan: Start cefepime 2 gm IV q24h tonight Flagyl 500 mg IV q8h ordered per MD Continue trending SCr for further vancomycin doses F/u cultures, clinical progress, and LOT F/u vancomycin levels as clinically indicated Consider narrowing cefepime/Flagyl to ceftriaxone if pt improves  Height: 5\' 11"  (180.3 cm) Weight: 170 lb (77.1 kg) IBW/kg (Calculated) : 75.3  Temp (24hrs), Avg:98.2 F (36.8 C), Min:98.2 F (36.8 C), Max:98.2 F (36.8 C)  Recent Labs  Lab 12/25/2019 1832 12/30/2019 2202 12/28/19 0134 12/28/19 0420  WBC 28.5*  --   --  21.3*  CREATININE 3.67*  --  3.36* 3.43*  LATICACIDVEN 3.5* 2.8*  --   --     Estimated Creatinine Clearance: 23.5 mL/min (A) (by C-G formula based on SCr of 3.43 mg/dL (H)).    No Known Allergies  Antimicrobials this admission: 3/3 Vancoymcin >>  3/3 Zosyn >> 3/4 3/4 Cefepime >> 3/4 Flagyl >>  Dose adjustments this admission: N/A  Microbiology results: 3/3 BCx: 1/4 E. coli, no KPC detected 3/3 UCx: sent   Thank you for allowing pharmacy to be a part of this patient's care.  02/27/20, PharmD PGY1 Pharmacy Resident  Please check AMION for all [facility] Pharmacy phone numbers After 10:00 PM, call Main Pharmacy [phone]  12/28/2019 1:58 PM

## 2019-12-28 NOTE — Progress Notes (Signed)
PT Note   Order received to start wound care with PT on 01/01/2020.  Will plan to begin on that date unless new orders received. Thanks.    Lavona Mound, PT   Acute Rehabilitation Services  Pager [phone] Office [phone]

## 2019-12-28 NOTE — ED Notes (Signed)
Checked patient cbg it was 34 notified RN Ryan of blood sugar patient is resting with call bell in reach

## 2019-12-28 NOTE — Consult Note (Signed)
ED Consult    Reason for Consult:  Bilateral lower extremity gangrenous changes Referring Physician:  Dr. Alanda Slim MRN #:  [id number]  History of Present Illness:  64 y.o. male presented last pm after being found down in his own stool. Has significant etoh history. Previous Left tma. History of uncontrolled dm.   Past Medical History:  Diagnosis Date  . Arthritis    "probably in my hands" (11/24/2017)  . Cellulitis of left foot 11/24/2017  . High cholesterol   . Hypertension   . Osteomyelitis of left foot (HCC)    Osteomyelitis of left hallux with overlying cellulitis left foot/notes 11/24/2017  . Tobacco abuse   . Tuberculosis ~ 1990   "had tx for it"  . Type II diabetes mellitus (HCC)   . Wound infection 03/07/2018   LEFT FOOT    Past Surgical History:  Procedure Laterality Date  . AMPUTATION Left 11/25/2017   Procedure: TRANSMETATARSAL AMPUTATION;  Surgeon: Roby Lofts, MD;  Location: [facility] OR;  Service: Orthopedics;  Laterality: Left;  . AMPUTATION Left 01/15/2018   Procedure: REVISION TRANS METATARSAL AMPUTATION;  Surgeon: Nadara Mustard, MD;  [facility] OR;  Service: Orthopedics;  Laterality: Left;    No Known Allergies  Prior to Admission medications   Medication Sig Start Date End Date Taking? Authorizing Provider  TRUEPLUS LANCETS 26G MISC Use as directed 03/14/18  Yes Hoy Register, MD  Blood Glucose Monitoring Suppl (TRUE METRIX METER) DEVI 1 each by Does not apply route daily. 11/29/17   Scherrie Gerlach, MD  glucose blood (TRUE METRIX BLOOD GLUCOSE TEST) test strip Use as instructed 03/14/18   Hoy Register, MD    Social History   Socioeconomic History  . Marital status: Single    Spouse name: Not on file  . Number of children: Not on file  . Years of education: Not on file  . Highest education level: Not on file  Occupational History  . Not on file  Tobacco Use  . Smoking status: Current Every Day Smoker    Packs/day: 1.00    Years: 46.00    Pack  years: 46.00    Types: Cigarettes  . Smokeless tobacco: Former Neurosurgeon    Types: Chew  Substance and Sexual Activity  . Alcohol use: Yes    Alcohol/week: 6.0 standard drinks    Types: 6 Cans of beer per week  . Drug use: Yes    Types: Marijuana    Comment: 11/24/2017 "nothing since ~ 2002"  . Sexual activity: Never  Other Topics Concern  . Not on file  Social History Narrative  . Not on file   Social Determinants of Health   Financial Resource Strain:   . Difficulty of Paying Living Expenses: Not on file  Food Insecurity:   . Worried About Programme researcher, broadcasting/film/video in the Last Year: Not on file  . Ran Out of Food in the Last Year: Not on file  Transportation Needs:   . Lack of Transportation (Medical): Not on file  . Lack of Transportation (Non-Medical): Not on file  Physical Activity:   . Days of Exercise per Week: Not on file  . Minutes of Exercise per Session: Not on file  Stress:   . Feeling of Stress : Not on file  Social Connections:   . Frequency of Communication with Friends and Family: Not on file  . Frequency of Social Gatherings with Friends and Family: Not on file  . Attends Religious Services: Not on file  .  Active Member of Clubs or Organizations: Not on file  . Attends Archivist Meetings: Not on file  . Marital Status: Not on file  Intimate Partner Violence:   . Fear of Current or Ex-Partner: Not on file  . Emotionally Abused: Not on file  . Physically Abused: Not on file  . Sexually Abused: Not on file    Family History  Problem Relation Age of Onset  . Cancer Father   . Cancer Sister   . Diabetes Mellitus II Other        States 6 out of 17 of his siblings     ROS: Unable to obtain secondary to patient status  Physical Examination  Vitals:   12/28/19 1300 12/28/19 1330  BP: 112/70 104/75  Pulse:  (!) 103  Resp: (!) 30 (!) 21  Temp:    SpO2:  93%   Body mass index is 23.71 kg/m.  General:  Not in acute distress, disheveled HENT:  WNL, normocephalic Pulmonary: non labored respirations CV: palpable femoral pulses Abdomen: soft, NT/ND, no masses Extremities: as pictured below         CBC    Component Value Date/Time   WBC 21.3 (H) 12/28/2019 0420   RBC 3.44 (L) 12/28/2019 0420   HGB 11.1 (L) 12/28/2019 0420   HCT 31.7 (L) 12/28/2019 0420   PLT 136 (L) 12/28/2019 0420   MCV 92.2 12/28/2019 0420   MCH 32.3 12/28/2019 0420   MCHC 35.0 12/28/2019 0420   RDW 13.4 12/28/2019 0420   LYMPHSABS 0.5 (L) 29-Dec-2019 1832   MONOABS 0.5 01/18/2020 1832   EOSABS 0.0 12/26/2019 1832   BASOSABS 0.1 01/24/2020 1832    BMET    Component Value Date/Time   NA 116 (LL) 12/28/2019 0420   NA 137 02/16/2017 1430   K 5.9 (H) 12/28/2019 0420   CL 86 (L) 12/28/2019 0420   CO2 15 (L) 12/28/2019 0420   GLUCOSE 110 (H) 12/28/2019 0420   BUN 63 (H) 12/28/2019 0420   BUN 8 02/16/2017 1430   CREATININE 3.43 (H) 12/28/2019 0420   CREATININE 0.83 12/16/2016 0949   CALCIUM 7.0 (L) 12/28/2019 0420   GFRNONAA 18 (L) 12/28/2019 0420   GFRAA 21 (L) 12/28/2019 0420    COAGS: Lab Results  Component Value Date   INR 1.3 (H) 12/28/2019   INR 0.99 03/08/2018   INR 0.99 01/15/2015     Non-Invasive Vascular Imaging:   Previous ABI >1 and triphasic in 2019  ASSESSMENT/PLAN: This is a 64 y.o. male with history of diabetes and previous left tma.  Also has significant history of alcohol use.  I spoke with his eldest brother Jaison Petraglia who states that he has not seen his brother in several months but he does live close to another brother who reportedly called EMS regarding his brothers current condition.  Patient is unable to carry conversation is relatively incoherent at this time does tell me that he lives on [address]  Unfortunately given the appearance of his legs he needs bilateral above-knee amputations.  These will likely be difficult to heal given the wounds on the posterior legs as well as on his buttocks.  He will also  likely require palliative evaluation during this hospitalization.  I did discuss with his brother that without bilateral above-knee amputations patient is likely to die and he is willing to give consent for this procedure.  His brother understands that patient may die anyway during this hospitalization given his underlying comorbidities.  Please keep patient n.p.o. past midnight.    Jabree Rebert C. Randie Heinz, MD [facility] Office: [phone] Pager: [phone]

## 2019-12-28 NOTE — ED Notes (Signed)
Report called to [facility] 4E RN

## 2019-12-29 ENCOUNTER — Inpatient Hospital Stay (HOSPITAL_COMMUNITY): Payer: Medicaid Other

## 2019-12-29 ENCOUNTER — Encounter (HOSPITAL_COMMUNITY): Payer: Medicaid Other

## 2019-12-29 ENCOUNTER — Encounter (HOSPITAL_COMMUNITY): Admission: EM | Disposition: E | Payer: Self-pay | Source: Home / Self Care | Attending: Family Medicine

## 2019-12-29 ENCOUNTER — Encounter (HOSPITAL_COMMUNITY): Payer: Self-pay | Admitting: Anesthesiology

## 2019-12-29 DIAGNOSIS — R6521 Severe sepsis with septic shock: Secondary | ICD-10-CM

## 2019-12-29 DIAGNOSIS — Z66 Do not resuscitate: Secondary | ICD-10-CM | POA: Diagnosis not present

## 2019-12-29 DIAGNOSIS — Z89432 Acquired absence of left foot: Secondary | ICD-10-CM

## 2019-12-29 DIAGNOSIS — Z515 Encounter for palliative care: Secondary | ICD-10-CM

## 2019-12-29 DIAGNOSIS — R338 Other retention of urine: Secondary | ICD-10-CM

## 2019-12-29 DIAGNOSIS — I959 Hypotension, unspecified: Secondary | ICD-10-CM

## 2019-12-29 DIAGNOSIS — L8996 Pressure-induced deep tissue damage of unspecified site: Secondary | ICD-10-CM

## 2019-12-29 DIAGNOSIS — Z7189 Other specified counseling: Secondary | ICD-10-CM

## 2019-12-29 DIAGNOSIS — I9589 Other hypotension: Secondary | ICD-10-CM

## 2019-12-29 LAB — BASIC METABOLIC PANEL WITH GFR
Anion gap: 11 (ref 5–15)
BUN: 70 mg/dL — ABNORMAL HIGH (ref 8–23)
CO2: 16 mmol/L — ABNORMAL LOW (ref 22–32)
Calcium: 7.4 mg/dL — ABNORMAL LOW (ref 8.9–10.3)
Chloride: 92 mmol/L — ABNORMAL LOW (ref 98–111)
Creatinine, Ser: 3.74 mg/dL — ABNORMAL HIGH (ref 0.61–1.24)
GFR calc Af Amer: 19 mL/min — ABNORMAL LOW
GFR calc non Af Amer: 16 mL/min — ABNORMAL LOW
Glucose, Bld: 89 mg/dL (ref 70–99)
Potassium: 5.4 mmol/L — ABNORMAL HIGH (ref 3.5–5.1)
Sodium: 119 mmol/L — CL (ref 135–145)

## 2019-12-29 LAB — OSMOLALITY, URINE: Osmolality, Ur: 236 mOsm/kg — ABNORMAL LOW (ref 300–900)

## 2019-12-29 LAB — BASIC METABOLIC PANEL
Anion gap: 13 (ref 5–15)
Anion gap: 14 (ref 5–15)
BUN: 72 mg/dL — ABNORMAL HIGH (ref 8–23)
BUN: 72 mg/dL — ABNORMAL HIGH (ref 8–23)
CO2: 15 mmol/L — ABNORMAL LOW (ref 22–32)
CO2: 14 mmol/L — ABNORMAL LOW (ref 22–32)
Calcium: 7.4 mg/dL — ABNORMAL LOW (ref 8.9–10.3)
Calcium: 7.2 mg/dL — ABNORMAL LOW (ref 8.9–10.3)
Chloride: 91 mmol/L — ABNORMAL LOW (ref 98–111)
Chloride: 94 mmol/L — ABNORMAL LOW (ref 98–111)
Creatinine, Ser: 3.51 mg/dL — ABNORMAL HIGH (ref 0.61–1.24)
Creatinine, Ser: 3.52 mg/dL — ABNORMAL HIGH (ref 0.61–1.24)
GFR calc Af Amer: 20 mL/min — ABNORMAL LOW (ref >60)
GFR calc Af Amer: 20 mL/min — ABNORMAL LOW (ref >60)
GFR calc non Af Amer: 17 mL/min — ABNORMAL LOW (ref >60)
GFR calc non Af Amer: 17 mL/min — ABNORMAL LOW (ref >60)
Glucose, Bld: 76 mg/dL (ref 70–99)
Glucose, Bld: 98 mg/dL (ref 70–99)
Potassium: 5.3 mmol/L — ABNORMAL HIGH (ref 3.5–5.1)
Potassium: 4.9 mmol/L (ref 3.5–5.1)
Sodium: 119 mmol/L — CL (ref 135–145)
Sodium: 122 mmol/L — ABNORMAL LOW (ref 135–145)

## 2019-12-29 LAB — MAGNESIUM: Magnesium: 1.7 mg/dL (ref 1.7–2.4)

## 2019-12-29 LAB — URINE CULTURE

## 2019-12-29 LAB — LACTIC ACID, PLASMA
Lactic Acid, Venous: 1.8 mmol/L (ref 0.5–1.9)
Lactic Acid, Venous: 1.1 mmol/L (ref 0.5–1.9)

## 2019-12-29 LAB — SODIUM, URINE, RANDOM: Sodium, Ur: 31 mmol/L

## 2019-12-29 LAB — GLUCOSE, CAPILLARY
Glucose-Capillary: 130 mg/dL — ABNORMAL HIGH (ref 70–99)
Glucose-Capillary: 133 mg/dL — ABNORMAL HIGH (ref 70–99)
Glucose-Capillary: 80 mg/dL (ref 70–99)
Glucose-Capillary: 81 mg/dL (ref 70–99)
Glucose-Capillary: 87 mg/dL (ref 70–99)

## 2019-12-29 LAB — CBC
HCT: 28.2 % — ABNORMAL LOW (ref 39.0–52.0)
Hemoglobin: 10.2 g/dL — ABNORMAL LOW (ref 13.0–17.0)
MCH: 33.2 pg (ref 26.0–34.0)
MCHC: 36.2 g/dL — ABNORMAL HIGH (ref 30.0–36.0)
MCV: 91.9 fL (ref 80.0–100.0)
Platelets: 102 10*3/uL — ABNORMAL LOW (ref 150–400)
RBC: 3.07 MIL/uL — ABNORMAL LOW (ref 4.22–5.81)
RDW: 13.7 % (ref 11.5–15.5)
WBC: 14.8 10*3/uL — ABNORMAL HIGH (ref 4.0–10.5)
nRBC: 0 % (ref 0.0–0.2)

## 2019-12-29 LAB — SURGICAL PCR SCREEN
MRSA, PCR: NEGATIVE
Staphylococcus aureus: NEGATIVE

## 2019-12-29 LAB — CORTISOL-AM, BLOOD: Cortisol - AM: 54.8 ug/dL — ABNORMAL HIGH (ref 6.7–22.6)

## 2019-12-29 LAB — APTT: aPTT: 33 seconds (ref 24–36)

## 2019-12-29 LAB — VANCOMYCIN, RANDOM: Vancomycin Rm: 14

## 2019-12-29 LAB — ECHOCARDIOGRAM COMPLETE
Height: 71 in
Weight: 2720 oz

## 2019-12-29 LAB — TSH: TSH: 2.435 u[IU]/mL (ref 0.350–4.500)

## 2019-12-29 LAB — PROTIME-INR
INR: 1.2 (ref 0.8–1.2)
Prothrombin Time: 15.6 seconds — ABNORMAL HIGH (ref 11.4–15.2)

## 2019-12-29 LAB — PHOSPHORUS: Phosphorus: 5.6 mg/dL — ABNORMAL HIGH (ref 2.5–4.6)

## 2019-12-29 SURGERY — AMPUTATION, ABOVE KNEE
Anesthesia: General | Site: Knee | Laterality: Bilateral

## 2019-12-29 MED ORDER — PROPOFOL 10 MG/ML IV BOLUS
INTRAVENOUS | Status: AC
  Filled 2019-12-29: qty 20

## 2019-12-29 MED ORDER — VANCOMYCIN HCL 1250 MG/250ML IV SOLN
1250.0000 mg | INTRAVENOUS | Status: DC
  Administered 2019-12-29: 1250 mg via INTRAVENOUS
  Filled 2019-12-29: qty 250

## 2019-12-29 MED ORDER — FOLIC ACID 1 MG PO TABS
1.0000 mg | ORAL_TABLET | Freq: Every day | ORAL | Status: DC
  Filled 2019-12-29: qty 1

## 2019-12-29 MED ORDER — MIDAZOLAM HCL 2 MG/2ML IJ SOLN
INTRAMUSCULAR | Status: AC
  Filled 2019-12-29: qty 2

## 2019-12-29 MED ORDER — TAB-A-VITE/IRON PO TABS
1.0000 | ORAL_TABLET | Freq: Every day | ORAL | Status: DC
  Filled 2019-12-29 (×3): qty 1

## 2019-12-29 MED ORDER — DEXTROSE-NACL 5-0.9 % IV SOLN: INTRAVENOUS | Status: DC

## 2019-12-29 MED ORDER — SODIUM CHLORIDE 0.9 % IV SOLN
2.0000 g | INTRAVENOUS | Status: DC
  Administered 2019-12-29 – 2019-12-30 (×2): 2 g via INTRAVENOUS
  Filled 2019-12-29: qty 2
  Filled 2019-12-29: qty 20
  Filled 2019-12-29: qty 2

## 2019-12-29 MED ORDER — FENTANYL CITRATE (PF) 250 MCG/5ML IJ SOLN
INTRAMUSCULAR | Status: AC
  Filled 2019-12-29: qty 5

## 2019-12-29 MED ORDER — PHENYLEPHRINE 40 MCG/ML (10ML) SYRINGE FOR IV PUSH (FOR BLOOD PRESSURE SUPPORT)
PREFILLED_SYRINGE | INTRAVENOUS | Status: AC
  Filled 2019-12-29: qty 10

## 2019-12-29 NOTE — Progress Notes (Signed)
CSW received consult from Palliative NP that patient and family are agreeable to residential hospice with preference of [facility].   CSW made referral to Wallis Bamberg with [facility], she will follow up but reports no beds avail at [facility] today.   [place], [place] [phone]

## 2019-12-29 NOTE — Progress Notes (Signed)
PROGRESS NOTE  Mark Graham EVO:[id number] DOB: [birth year]   PCP: Hoy Register, MD  Patient is from: Home.  Lives alone.  DOA: 01/05/2020 LOS: 2  Brief Narrative / Interim history: 64 year old male with history of DM-2, alcohol abuse, tobacco use, psychotic disorder, ambulatory dysfunction and HTN presenting with altered mental status after found sitting in how own stool by his brother for 48 minutes.  He was admitted with sepsis due to necrotic bilateral lower extremity wounds, hyponatremia, AKI, hyperkalemia, alcohol intoxication/withdrawal and metabolic encephalopathy. Reportedly drinks about a sixpack of beer daily but hasn't eaten in a while.  In ED, slightly tachycardic and tachypneic.  Mental status improved.  WBC 28.  Na 109.  K6.1.  CO2 15.  AG 18.  Glucose 184.   Cr 3.67 (normal baseline).  AST 70.  T bili 1.4.  Internal level 22.  CK and magnesium within normal.  Blood cultures obtained.  CXR with left basilar atelectasis. RUQ [place] with fatty liver, gallbladder sludge and mild right hydronephrosis.  Started on vancomycin and Zosyn with IV fluid. PCCM consulted but felt stepdown bed is appropriate.  Antibiotics changed to vancomycin, cefepime and Flagyl.  Vascular surgery consulted and suggested bilateral AKA or palliative care.  Palliative care consulted and discussed with patient and family.  CODE STATUS changed to DNR/DNI.   Subjective: No major events overnight or this morning.  Patient is somewhat sleepy.  He wakes to voice.  Able to tell me his name.  He denies pain.  Able to follow commands.  Objective: Vitals:   01/01/2020 0600 01/08/2020 0700 01/07/2020 0800 12/30/2019 1200  BP: 97/84 93/62 (!) 97/40 (!) 87/56  Pulse: (!) 110 (!) 109 (!) 107 (!) 106  Resp: 14 16 14 13   Temp: 99.1 F (37.3 C) 99.1 F (37.3 C) 99.1 F (37.3 C) 98.4 F (36.9 C)  TempSrc:    Bladder  SpO2: 99% 99% 99% 97%  Weight:      Height:        Intake/Output Summary (Last 24 hours) at 01/17/2020 1227  Last data filed at 01/22/2020 0900 Gross per 24 hour  Intake 1826.44 ml  Output 3350 ml  Net -1523.56 ml   Filed Weights   01/20/2020 1758  Weight: 77.1 kg    Examination:   GENERAL: Disheveled and chronically ill-appearing. HEENT: MMM.  Vision and hearing grossly intact.  NECK: Supple.  No apparent JVD.  RESP: On 2 L by [facility].  No IWOB.  Fair aeration with rhonchi bilaterally. CVS: HR 100-120s.02/26/20 Heart sounds normal.  ABD/GI/GU: Bowel sounds present. Soft.  Diffuse tenderness to palpation.  Foley catheter in place. MSK/EXT:  Moves extremities. s/p transmetatarsal amputation of left foot.  Gangrenous changes over both feet.  Scattered skin wounds proximally. SKIN: Gangrenous skin changes over both feet.  No palpable DP pulses bilaterally.  Multiple sacral decubitus.  Crusted skin wounds over medial aspect of both hands. NEURO: Sleepy but wakes to voice.  Oriented to self.  Follows commands.  No apparent focal neuro deficit. PSYCH: Calm.  No distress or agitation.  Procedures:  None  Assessment & Plan: Severe sepsis secondary to E. coli bacteremia, possible UTI and gangrenous BLE with multiple wounds in patient with poorly controlled diabetes: POA.  Still with AKI, encephalopathy, tachycardia and leukocytosis but improving.  Blood culture with E. Coli.  MRSA screen negative.  UA with pyuria. -Vancomycin 3/4-3/5.  Zosyn 3/4.  Cefepime 3/4-3/5 -Ceftriaxone 3/5>> -Flagyl 3/4>> -Appreciate vascular surgery input -Appreciate WOC input -Appreciate  palliative care input  Hypotension: Soft DBP in for this this morning. -Antibiotics as above -Obtain echocardiogram -IV D5-NS at 125 cc an hour  Acute renal failure with azotemia: Cr 0.95 (in 2019)> 3.67 (admit)> 3.52.  This is multifactorial including dehydration, sepsis and obstructive uropathy from acute urinary retention with moderate left hydronephrosis. -Continue indwelling Foley catheter -IV fluid as above -Continue monitoring -Avoid  nephrotoxic meds  Acute metabolic encephalopathy due to sepsis, alcohol intoxication, hyponatremia and renal failure -Treat treatable causes -Frequent orientation delirium precautions  Hyponatremia: Likely chronic from alcohol use.  Improving. -Continue monitoring every 6 hours -IV fluid as above  Hyperkalemia: Likely due to renal failure.  Resolved. -Continue monitoring  Controlled DM-2 with hyperglycemia and lower extremity wounds: A1c 6.5%. Recent Labs    2020/01/13 0001 January 13, 2020 0417 13-Jan-2020 0801  GLUCAP 81 87 80  -Continue SSI   Alcohol use disorder with severe dependence: reportedly drinks about 6 packs a day.  EtOH level 22 on admission.  Received banana bag with multivitamins. -CIWA monitoring with Ativan -Continue multivitamin, folic acid and high-dose thiamine -TOC consulted  Lactic acidosis: Likely due to sepsis, alcohol and renal failure.  Improved. -Continue IV fluid -Treat treatable causes.  Anion gap metabolic acidosis: Likely due to sepsis, alcoholic renal failure. -May need sodium bicarb.  Tobacco use disorder: Reportedly smokes about a pack a day -Nicotine patch -Encourage cessation when able  Debility/physical deconditioning/inability to care for self -PT/OT -Consulted TOC  Failure to thrive -Dietitian consulted  Thrombocytopenia -continue monitoring  Acute urinary retention with moderate left hydronephrosis -Continue indwelling Foley catheter at least for a week  Complex right renal cyst-complex cyst about 3.7 cm noted on renal ultrasound. -Need outpatient MRI or CT for definite characterization.  Bilateral sacral decubitus ulcer-POA Multiple upper and lower extremity ulcers-ranging from stage I to stage II-POA Pressure Injury 12/28/19 Sacrum Bilateral Unstageable - Full thickness tissue loss in which the base of the injury is covered by slough (yellow, tan, gray, green or brown) and/or eschar (tan, brown or black) in the wound bed. (Active)   12/28/19   Location: Sacrum  Location Orientation: Bilateral  Staging: Unstageable - Full thickness tissue loss in which the base of the injury is covered by slough (yellow, tan, gray, green or brown) and/or eschar (tan, brown or black) in the wound bed.  Wound Description (Comments):   Present on Admission: Yes   Goal of care: patient with multiple acute and chronic comorbidities as above.  Very poor prognosis.  Very poor quality of life even if he happens to recover from current illness.  Palliative care consulted.  CODE STATUS changed to DNR/DNI.  -Palliative to meet with family today            DVT prophylaxis: Start subcu heparin Code Status: DNR/DNI Family Communication: Updated patient's brother, [name] over the phone on 3/4  Discharge barrier: Severe sepsis with significant electrolyte derangement and encephalopathy Patient is from: Home. Final disposition: To be determined  Consultants: Vascular surgery, palliative care   Microbiology summarized: COVID-19 negative. Influenza PCR negative. Blood cultures -E. coli Urine culture pending.  Sch Meds:  Scheduled Meds: . chlorhexidine  15 mL Mouth Rinse BID  . Chlorhexidine Gluconate Cloth  6 each Topical Daily  . collagenase   Topical TID  . folic acid  1 mg Oral Daily  . heparin injection (subcutaneous)  5,000 Units Subcutaneous Q8H  . insulin aspart  0-9 Units Subcutaneous TID WC  . mouth rinse  15 mL Mouth  Rinse q12n4p  . multivitamins with iron  1 tablet Oral Daily  . nicotine  21 mg Transdermal Daily  . sodium hypochlorite   Irrigation TID  . vancomycin variable dose per unstable renal function (pharmacist dosing)   Does not apply See admin instructions   Continuous Infusions: . ceFEPime (MAXIPIME) IV Stopped (12/28/19 2150)  . dextrose 5 % and 0.9% NaCl 125 mL/hr at 01/02/2020 0754  . metronidazole Stopped (12/30/2019 0656)  . thiamine injection 250 mg (01/03/2020 0930)   PRN Meds:.  Antimicrobials:  Anti-infectives (From admission, onward)   Start     Dose/Rate Route Frequency Ordered Stop   12/28/19 2200  metroNIDAZOLE (FLAGYL) IVPB 500 mg     500 mg 100 mL/hr over 60 Minutes Intravenous Every 8 hours 12/28/19 1355     12/28/19 1800  ceFEPIme (MAXIPIME) 2 g in sodium chloride 0.9 % 100 mL IVPB     2 g 200 mL/hr over 30 Minutes Intravenous Every 24 hours 12/28/19 1355     12/28/19 0245  piperacillin-tazobactam (ZOSYN) IVPB 3.375 g  Status:  Discontinued     3.375 g 12.5 mL/hr over 240 Minutes Intravenous Every 8 hours 01/01/2020 2048 12/28/19 1355   01/14/2020 2042  vancomycin variable dose per unstable renal function (pharmacist dosing)      Does not apply See admin instructions 01/05/2020 2042     01/15/2020 1815  vancomycin (VANCOREADY) IVPB 1500 mg/300 mL     1,500 mg 150 mL/hr over 120 Minutes Intravenous  Once 01/04/2020 1810 01/23/2020 2159   12/26/2019 1815  piperacillin-tazobactam (ZOSYN) IVPB 3.375 g     3.375 g 100 mL/hr over 30 Minutes Intravenous  Once 01/08/2020 1810 01/04/2020 1913       I have personally reviewed the following labs and images: CBC: Recent Labs  Lab 01/23/2020 1832 12/28/19 0420 01/08/2020 0242  WBC 28.5* 21.3* 14.8*  NEUTROABS 26.9*  --   --   HGB 13.6 11.1* 10.2*  HCT 38.4* 31.7* 28.2*  MCV 93.2 92.2 91.9  PLT 191 136* 102*   BMP &GFR Recent Labs  Lab 01/19/2020 1832 12/28/19 0134 12/28/19 0420 12/28/19 1106 12/28/19 1612 12/28/19 2312 01/05/2020 0242 01/22/2020 0923  NA 109*   < > 116*  --  117* 119* 119* 122*  K 6.1*   < > 5.9*  --  6.2* 5.4* 5.3* 4.9  CL 76*   < > 86*  --  86* 92* 91* 94*  CO2 15*   < > 15*  --  16* 16* 15* 14*  GLUCOSE 184*   < > 110*  --  93 89 76 98  BUN 63*   < > 63*  --  72* 70* 72* 72*  CREATININE 3.67*   < > 3.43*  --  3.88* 3.74* 3.51* 3.52*  CALCIUM 8.1*   < > 7.0*  --  7.4* 7.4* 7.4* 7.2*  MG 1.9  --   --  1.7  --   --  1.7  --   PHOS 6.3*  --   --  6.7*  --   --  5.6*  --    < > = values in this interval not  displayed.   Estimated Creatinine Clearance: 22.9 mL/min (A) (by C-G formula based on SCr of 3.52 mg/dL (H)). Liver & Pancreas: Recent Labs  Lab 12/30/2019 1832  AST 70*  ALT 39  ALKPHOS 132*  BILITOT 1.4*  PROT 6.5  ALBUMIN 2.0*   No results for input(s): LIPASE,  AMYLASE in the last 168 hours. No results for input(s): AMMONIA in the last 168 hours. Diabetic: Recent Labs    01/14/2020 1832  HGBA1C 6.5*   Recent Labs  Lab 12/28/19 1652 12/28/19 2017 30-Apr-2020 0001 30-Apr-2020 0417 30-Apr-2020 0801  GLUCAP 162* 141* 81 87 80   Cardiac Enzymes: Recent Labs  Lab 12/29/2019 1832  CKTOTAL 392   No results for input(s): PROBNP in the last 8760 hours. Coagulation Profile: Recent Labs  Lab 01/11/2020 1832 30-Apr-2020 0242  INR 1.3* 1.2   Thyroid Function Tests: Recent Labs    30-Apr-2020 0923  TSH 2.435   Lipid Profile: No results for input(s): CHOL, HDL, LDLCALC, TRIG, CHOLHDL, LDLDIRECT in the last 72 hours. Anemia Panel: No results for input(s): VITAMINB12, FOLATE, FERRITIN, TIBC, IRON, RETICCTPCT in the last 72 hours. Urine analysis:    Component Value Date/Time   COLORURINE AMBER (A) 12/28/2019 0420   APPEARANCEUR HAZY (A) 12/28/2019 0420   LABSPEC 1.015 12/28/2019 0420   PHURINE 5.0 12/28/2019 0420   GLUCOSEU NEGATIVE 12/28/2019 0420   HGBUR MODERATE (A) 12/28/2019 0420   BILIRUBINUR NEGATIVE 12/28/2019 0420   KETONESUR NEGATIVE 12/28/2019 0420   PROTEINUR 30 (A) 12/28/2019 0420   UROBILINOGEN 1.0 01/15/2015 1946   NITRITE NEGATIVE 12/28/2019 0420   LEUKOCYTESUR MODERATE (A) 12/28/2019 0420   Sepsis Labs: Invalid input(s): PROCALCITONIN, LACTICIDVEN  Microbiology: Recent Results (from the past 240 hour(s))  Blood Culture (routine x 2)     Status: Abnormal (Preliminary result)   Collection Time: 01/05/2020  6:11 PM   Specimen: BLOOD LEFT ARM  Result Value Ref Range Status   Specimen Description BLOOD LEFT ARM  Final   Special Requests   Final    BOTTLES DRAWN  AEROBIC AND ANAEROBIC Blood Culture adequate volume   Culture  Setup Time   Final    GRAM NEGATIVE RODS AEROBIC BOTTLE ONLY CRITICAL RESULT CALLED TO, READ BACK BY AND VERIFIED WITH: Gearlean AlfJ. Steenwyk PharmD 13:35 12/28/19 ([email or web address]) Performed at [facility], [address] [place], [place] [address]    Culture ESCHERICHIA COLI (A)  Final   Report Status PENDING  Incomplete  Blood Culture ID Panel (Reflexed)     Status: Abnormal   Collection Time: 01/16/2020  6:11 PM  Result Value Ref Range Status   Enterococcus species NOT DETECTED NOT DETECTED Final   Listeria monocytogenes NOT DETECTED NOT DETECTED Final   Staphylococcus species NOT DETECTED NOT DETECTED Final   Staphylococcus aureus (BCID) NOT DETECTED NOT DETECTED Final   Streptococcus species NOT DETECTED NOT DETECTED Final   Streptococcus agalactiae NOT DETECTED NOT DETECTED Final   Streptococcus pneumoniae NOT DETECTED NOT DETECTED Final   Streptococcus pyogenes NOT DETECTED NOT DETECTED Final   Acinetobacter baumannii NOT DETECTED NOT DETECTED Final   Enterobacteriaceae species DETECTED (A) NOT DETECTED Final    Comment: Enterobacteriaceae represent a large family of gram-negative bacteria, not a single organism. CRITICAL RESULT CALLED TO, READ BACK BY AND VERIFIED WITH: Gearlean AlfJ. Steenwyk PharmD 13:35 12/28/19 ([email or web address])    Enterobacter cloacae complex NOT DETECTED NOT DETECTED Final   Escherichia coli DETECTED (A) NOT DETECTED Final    Comment: CRITICAL RESULT CALLED TO, READ BACK BY AND VERIFIED WITH: Gearlean AlfJ. Steenwyk PharmD 13:35 12/28/19 ([email or web address])    Klebsiella oxytoca NOT DETECTED NOT DETECTED Final   Klebsiella pneumoniae NOT DETECTED NOT DETECTED Final   Proteus species NOT DETECTED NOT DETECTED Final   Serratia marcescens NOT DETECTED NOT DETECTED Final   Carbapenem resistance NOT DETECTED  NOT DETECTED Final   Haemophilus influenzae NOT DETECTED NOT DETECTED Final   Neisseria meningitidis NOT DETECTED NOT DETECTED Final    Pseudomonas aeruginosa NOT DETECTED NOT DETECTED Final   Candida albicans NOT DETECTED NOT DETECTED Final   Candida glabrata NOT DETECTED NOT DETECTED Final   Candida krusei NOT DETECTED NOT DETECTED Final   Candida parapsilosis NOT DETECTED NOT DETECTED Final   Candida tropicalis NOT DETECTED NOT DETECTED Final    Comment: Performed at [facility], [address] [place], [place] [address]  Blood Culture (routine x 2)     Status: None (Preliminary result)   Collection Time: Jan 20, 2020  6:32 PM   Specimen: BLOOD  Result Value Ref Range Status   Specimen Description BLOOD SITE NOT SPECIFIED  Final   Special Requests   Final    BOTTLES DRAWN AEROBIC AND ANAEROBIC Blood Culture adequate volume   Culture  Setup Time   Final    GRAM NEGATIVE RODS IN BOTH AEROBIC AND ANAEROBIC BOTTLES CRITICAL VALUE NOTED.  VALUE IS CONSISTENT WITH PREVIOUSLY REPORTED AND CALLED VALUE. Performed at [facility], [address], [place], [place] [address]    Culture GRAM NEGATIVE RODS  Final   Report Status PENDING  Incomplete  Respiratory Panel by RT PCR (Flu A&B, Covid) - Nasopharyngeal Swab     Status: None   Collection Time: 01/20/20  7:44 PM   Specimen: Nasopharyngeal Swab  Result Value Ref Range Status   SARS Coronavirus 2 by RT PCR NEGATIVE NEGATIVE Final    Comment: (NOTE) SARS-CoV-2 target nucleic acids are NOT DETECTED. The SARS-CoV-2 RNA is generally detectable in upper respiratoy specimens during the acute phase of infection. The lowest concentration of SARS-CoV-2 viral copies this assay can detect is 131 copies/mL. A negative result does not preclude SARS-Cov-2 infection and should not be used as the sole basis for treatment or other patient management decisions. A negative result may occur with  improper specimen collection/handling, submission of specimen other than nasopharyngeal swab, presence of viral mutation(s) within the areas targeted by this assay, and inadequate  number of viral copies (<131 copies/mL). A negative result must be combined with clinical observations, patient history, and epidemiological information. The expected result is Negative. Fact Sheet for Patients:  https://www.moore.com/ Fact Sheet for Healthcare Providers:  https://www.young.biz/ This test is not yet ap proved or cleared by the [place] FDA and  has been authorized for detection and/or diagnosis of SARS-CoV-2 by FDA under an Emergency Use Authorization (EUA). This EUA will remain  in effect (meaning this test can be used) for the duration of the COVID-19 declaration under Section 564(b)(1) of the Act, 21 U.S.C. section 360bbb-3(b)(1), unless the authorization is terminated or revoked sooner.    Influenza A by PCR NEGATIVE NEGATIVE Final   Influenza B by PCR NEGATIVE NEGATIVE Final    Comment: (NOTE) The Xpert Xpress SARS-CoV-2/FLU/RSV assay is intended as an aid in  the diagnosis of influenza from Nasopharyngeal swab specimens and  should not be used as a sole basis for treatment. Nasal washings and  aspirates are unacceptable for Xpert Xpress SARS-CoV-2/FLU/RSV  testing. Fact Sheet for Patients: https://www.moore.com/ Fact Sheet for Healthcare Providers: https://www.young.biz/ This test is not yet approved or cleared by the [place] FDA and  has been authorized for detection and/or diagnosis of SARS-CoV-2 by  FDA under an Emergency Use Authorization (EUA). This EUA will remain  in effect (meaning this test can be used) for the duration  of the  Covid-19 declaration under Section 564(b)(1) of the Act, 21  U.S.C. section 360bbb-3(b)(1), unless the authorization is  terminated or revoked. Performed at [facility], [address], [place], [place] [address]   Urine culture     Status: Abnormal   Collection Time: 12/28/19  7:49 AM   Specimen: In/Out Cath Urine  Result Value  Ref Range Status   Specimen Description IN/OUT CATH URINE  Final   Special Requests   Final    NONE Performed at [facility], [address] [place], [place] [address]    Culture MULTIPLE SPECIES PRESENT, SUGGEST RECOLLECTION (A)  Final   Report Status 01/20/2020 FINAL  Final  Surgical pcr screen     Status: None   Collection Time: 12/28/19 11:23 PM   Specimen: Nasal Mucosa; Nasal Swab  Result Value Ref Range Status   MRSA, PCR NEGATIVE NEGATIVE Final   Staphylococcus aureus NEGATIVE NEGATIVE Final    Comment: (NOTE) The Xpert SA Assay (FDA approved for NASAL specimens in patients 31 years of age and older), is one component of a comprehensive surveillance program. It is not intended to diagnose infection nor to guide or monitor treatment. Performed at [facility], [address], [place], [place] [address]     Radiology Studies: US RENAL  Result Date: 12/28/2019 CLINICAL DATA:  Acute kidney injury. EXAM: RENAL / URINARY TRACT ULTRASOUND COMPLETE COMPARISON:  01/23/2017.  A FINDINGS: Right Kidney: Renal measurements: 13.6 x 6.9 x 6.7 cm = volume: 329.2 mL. Mild hydronephrosis. Normal echogenicity. No mass. Left Kidney: Renal measurements: 12.3 x 6.6 x 8.2 cm. = volume: 350.1 mL. Moderate hydronephrosis. Hypoechoic structure arising from upper pole of the right kidney measures 3.7 x 2.3 x 2.8 cm. Bladder: Appears normal for degree of bladder distention. Foley catheter in place. Other: None. IMPRESSION: 1. Mild right hydronephrosis and moderate left hydronephrosis. 2. Complex cyst within upper pole of right kidney is noted measuring 3.7 cm. Recommend follow-up nonemergent cross-sectional imaging, either MRI or CT without and with contrast material for more definitive characterization. Electronically Signed   By: Signa Kell M.D.   On: 12/28/2019 17:45       Melia Hopes T. Romi Rathel [facility]  If 7PM-7AM, please contact night-coverage [email or web address] Password [facility]  01/22/2020, 12:27 PM

## 2019-12-29 NOTE — Anesthesia Preprocedure Evaluation (Deleted)
Anesthesia Evaluation  Patient identified by MRN, date of birth, ID band Patient awake    Reviewed: Allergy & Precautions, NPO status , Patient's Chart, lab work & pertinent test results  Airway        Dental   Pulmonary Current Smoker,           Cardiovascular hypertension, Pt. on medications   Ck 392  2018 echo Left ventricle: The cavity size was normal. Systolic function was  normal. The estimated ejection fraction was in the range of 60%  to 65%. Wall motion was normal; there were no regional wall  motion abnormalities. Left ventricular diastolic function  parameters were normal.   Neuro/Psych PSYCHIATRIC DISORDERS Schizophrenia negative neurological ROS     GI/Hepatic negative GI ROS, (+)     substance abuse  alcohol use,   Endo/Other  diabetes, Type 2  Renal/GU CRFRenal disease     Musculoskeletal  (+) Arthritis ,   Abdominal   Peds  Hematology   Anesthesia Other Findings   Reproductive/Obstetrics                           Anesthesia Physical Anesthesia Plan  ASA: IV  Anesthesia Plan: General   Post-op Pain Management:    Induction: Intravenous  PONV Risk Score and Plan: Treatment may vary due to age or medical condition, Ondansetron and Dexamethasone  Airway Management Planned: Oral ETT  Additional Equipment: Arterial line  Intra-op Plan:   Post-operative Plan: Possible Post-op intubation/ventilation and Extubation in OR  Informed Consent: I have reviewed the patients History and Physical, chart, labs and discussed the procedure including the risks, benefits and alternatives for the proposed anesthesia with the patient or authorized representative who has indicated his/her understanding and acceptance.     Dental advisory given  Plan Discussed with:   Anesthesia Plan Comments:        Anesthesia Quick Evaluation

## 2019-12-29 NOTE — Progress Notes (Signed)
Nutrition Brief Note  RD consulted for diet education. Palliative met with family today. Transition care to symptoms management. Plan for d/c to [facility].   No further nutrition interventions warranted at this time.  Please re-consult as needed.   Mariana Single RD, LDN Clinical Nutrition Pager listed in [place]

## 2019-12-29 NOTE — Progress Notes (Signed)
Pharmacy Antibiotic Note  Mark Graham is a 64 y.o. male admitted on 2020/01/04 with sepsis (noted with LE necrotic wounds). He needs above-knee amputations bilaterally but patient is refusing.  Pharmacy dosing cefepime and vancomycin (he is also on flagyl) -WBC= 14.8, afebrile, SCr= 3.52, urine output ~ 3L -blood cultures show Ecoli (sensitivites pending) -vancomycin random = 14 at ~ noon today (last dose was 1750mg  at 3:30pm on 3/3)  Plan: -Vancomycin 1250mg  IV q48hr -Continue cefepime 2gm IV q24hr -Will follow renal function, cultures and clinical progress    Height: 5\' 11"  (180.3 cm) Weight: 170 lb (77.1 kg) IBW/kg (Calculated) : 75.3  Temp (24hrs), Avg:97.4 F (36.3 C), Min:95 F (35 C), Max:99.1 F (37.3 C)  Recent Labs  Lab Jan 04, 2020 1832 2020-01-04 2202 12/28/19 0134 12/28/19 0420 12/28/19 1612 12/28/19 2312 12/28/2019 0242 12/28/2019 0923 01/19/2020 1150  WBC 28.5*  --   --  21.3*  --   --  14.8*  --   --   CREATININE 3.67*  --    < > 3.43* 3.88* 3.74* 3.51* 3.52*  --   LATICACIDVEN 3.5* 2.8*  --   --   --  1.8 1.1  --   --   VANCORANDOM  --   --   --   --   --   --   --   --  14   < > = values in this interval not displayed.    Estimated Creatinine Clearance: 22.9 mL/min (A) (by C-G formula based on SCr of 3.52 mg/dL (H)).    No Known Allergies  Antimicrobials this admission: 3/3 Vancoymcin >>  3/3 Zosyn >> 3/4 3/4 Cefepime >> 3/4 Flagyl >>  Dose adjustments this admission: N/A  Microbiology results: 3/3 BCx: 1/4 E. coli, no KPC detected 3/3 UCx: multiple  Thank you for allowing pharmacy to be a part of this patient's care.  02/28/20, PharmD Clinical Pharmacist **Pharmacist phone directory can now be found on [email or web address] ([email or web address]).  Listed under [facility].

## 2019-12-29 NOTE — Progress Notes (Signed)
Pt's sister, Silvio Pate, at bedside.  States pt told her he would like to have surgery.  I messaged palliative, NP was going to come up, but Silvio Pate has already left.  Palliative NP states she will give sister a call.

## 2019-12-29 NOTE — Progress Notes (Signed)
SLP Cancellation Note  Patient Details Name: Mark Graham MRN: [id number] DOB: June 23, 1956   Cancelled treatment:       Reason Eval/Treat Not Completed: Medical issues which prohibited therapy;Fatigue/lethargy limiting ability to participate(Case d/w Marchelle Folks, RN and she reported that the pt is inadequately responsive at this time to participate in swallow evaluation and that he was NPO for surgery but then refused it. SLP will follow up.)  Deeksha Cotrell I. Vear Clock, MS, CCC-SLP Acute Rehabilitation Services Office number [phone] Pager [phone]  Scheryl Marten 01/18/2020, 8:55 AM

## 2019-12-29 NOTE — Progress Notes (Signed)
  Progress Note    12/28/2019 6:12 AM * No surgery date entered *  Subjective: No complaints currently  Vitals:   01/04/2020 0400 01/24/2020 0500  BP: 125/63 102/72  Pulse: (!) 106 (!) 106  Resp: 17 15  Temp: 98.8 F (37.1 C) 99 F (37.2 C)  SpO2: 100% 98%    Physical Exam: More alert and oriented to person and place Nonlabored respirations Stable bilateral lower extremity gangrenous changes  CBC    Component Value Date/Time   WBC 14.8 (H) 01/24/2020 0242   RBC 3.07 (L) 01/12/2020 0242   HGB 10.2 (L) 01/07/2020 0242   HCT 28.2 (L) 01/13/2020 0242   PLT 102 (L) 01/03/2020 0242   MCV 91.9 01/06/2020 0242   MCH 33.2 01/04/2020 0242   MCHC 36.2 (H) 01/24/2020 0242   RDW 13.7 01/12/2020 0242   LYMPHSABS 0.5 (L) 29-Dec-2019 1832   MONOABS 0.5 01/03/2020 1832   EOSABS 0.0 Dec 29, 2019 1832   BASOSABS 0.1 12/25/2019 1832    BMET    Component Value Date/Time   NA 119 (LL) 01/05/2020 0242   NA 137 02/16/2017 1430   K 5.3 (H) 12/26/2019 0242   CL 91 (L) 01/20/2020 0242   CO2 15 (L) 01/24/2020 0242   GLUCOSE 76 01/20/2020 0242   BUN 72 (H) 01/19/2020 0242   BUN 8 02/16/2017 1430   CREATININE 3.51 (H) 01/08/2020 0242   CREATININE 0.83 12/16/2016 0949   CALCIUM 7.4 (L) 01/21/2020 0242   GFRNONAA 17 (L) 01/08/2020 0242   GFRAA 20 (L) 01/19/2020 0242    INR    Component Value Date/Time   INR 1.2 12/25/2019 0242     Intake/Output Summary (Last 24 hours) at 01/18/2020 0612 Last data filed at 01/07/2020 0500 Gross per 24 hour  Intake 1526.44 ml  Output 3150 ml  Net -1623.56 ml     Assessment/plan:  64 y.o. male is s/p with bilateral lower extremity extensive gangrenous changes.  I discussed with the patient that he will need above-knee amputations bilaterally at this time he is refusing.  Please keep n.p.o. we will discuss with him again later.  Gangrenous changes currently dry unlikely acute threat to life but if he refuses amputations will need palliative  care.    Seward Coran C. Randie Heinz, MD [facility] Office: [phone] Pager: [phone]  01/22/2020 6:12 AM

## 2019-12-29 NOTE — Plan of Care (Signed)
Palliative met with patient's family.  Now full comfort measures with plan to transfer to [facility] place in the morning. Will continue antibiotics and IVF per family's wish until transfer.

## 2019-12-29 NOTE — Consult Note (Signed)
Consultation Note Date: 01/23/20   Patient Name: Mark Graham  DOB: Jul 08, 1956  MRN: [id number]  Age / Sex: 64 y.o., male  PCP: [name], MD Referring Physician: Mercy Riding, MD  Reason for Consultation: Establishing goals of care  HPI/Patient Profile:  Per intake H&P --> Mark Graham is a 63 y.o. male with PMH DM s/p L transmetatarsal amputation, HTN, psychotic disorder, alcohol abuse, tobacco use who presents for failure to thrive and found to have necrotic wounds, sepsis, hyponatremia, AKI. Patient brought from home by EMS due to concerns from brother who visited patient earlier today and found him sitting in his own stool. Initially the patient was minimally responsive and confused but after ED evaluation and initial resuscitation, patient now alert and oriented. Patient reports that he lives alone. He is unable to ambulate but his brother will occasionally check on him and drop food/beer off. Reports that he has not eaten for several days and has drank a 6 pack of beer daily for last several days. He is unable to provide much history of what happened today or why he was sitting in his own stool or for how long. Currently reports feeling well and denies any complaints. Denies fever, chills, headache, dizziness, cough, SOB, chest pain, abdominal pain, nausea, vomiting, diarrhea, constipation, dysuria, hematuria, hematochezia, melena, difficulty moving arms/legs, speech difficulty or any other complaints.  Clinical Assessment and Goals of Care: I have reviewed medical records including EPIC notes, labs and imaging, received report from bedside RN, assessed the patient. Patient is somnolent. I am unable during the morning to illicit a meaningful response.    I called Mark Graham (brother) to further discuss diagnosis prognosis, GOC, EOL wishes, disposition and options.  I asked Mark Graham to tell me what he  understood about his brothers hospitalization. Mark Graham shared that his brother "isn't doing well". He said that he had been drinking excessively in his home. Mark Graham and his other family members felt he should be hospitalizated weeks ago, but Mark Graham did not want to go to the hospital. Mark Graham shares that he spoke to Dr. Donzetta Matters who had offered Mark Graham bilateral leg amputations but Mark Graham refused this. Mark Graham said that he feels like it was Ravin's decision to make and if he did not wish to proceed with this surgery Mark Graham respects and understands why.    I introduced Palliative Medicine as specialized medical care for people living with serious illness. It focuses on providing relief from the symptoms and stress of a serious illness. The goal is to improve quality of life for both the patient and the family.  I shared with Mark Graham that [name] is doing quite poorly. He said that he plans to meet with his other siblings today to discuss his condition.   Mark Graham was not working as of these recent years. He has batteled alcoholism for many years. He lived with his mother until her death in 32. He then lived with his brother until his recent death. Mark Graham still lives in his brothers home. He  is not able to do anything for himself and if confined to his bed due to immobility. He was still drinking the alcohol that his family members would incrementally bring to his home until hospitalization.   Mark Graham was never married and has no children. He is not a religious man.   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  Concepts specific to code status, artifical feeding and hydration, continued IV antibiotics and rehospitalization was had.    The difference between a aggressive medical intervention path  and a palliative comfort care path for this patient at this time was had.   Values and  goals of care important to patient and family were attempted to be elicited.  I shared with Mark Graham that if we were to pursue cardiopulmonary resuscitation on Mark Graham or intubate him that the likelihood he would reach a meaningful recovery would be quite poor. Mark Graham said that he understood this and would agree with the medical team. He agrees to DNAR/DNI.   Discussed with patient the importance of continued conversation with family and their medical providers regarding overall plan of care and treatment options, ensuring decisions are within the context of the patients values and GOCs.  Decision Maker: Mark Graham (Advocate) [phone]  SUMMARY OF RECOMMENDATIONS   DNAR/DNI  Ongoing family discussions regarding hospice  Plan for family meeting with patients two brothers this afternoon  Code Status/Advance Care Planning:  DNR   Symptom Management:   Per primary team  Palliative Prophylaxis:   Aspiration, Bowel Regimen, Delirium Protocol, Eye Care, Frequent Pain Assessment, Oral Care, Palliative Wound Care and Turn Reposition  Additional Recommendations (Limitations, Scope, Preferences):  Family in conversation about whether or not to complete a comfort, hospice orient route. Right now will continue these treatments.   Psycho-social/Spiritual:   Desire for further Chaplaincy support: No  Additional Recommendations: Caregiving  Support/Resources and Education on Hospice  Prognosis:   < 2 weeks  Discharge Planning: Unclear    Primary Diagnoses: Present on Admission: . Sepsis (HCC) . Hypertension . Hyponatremia . Tobacco abuse . Psychosis (HCC) . Protein-calorie malnutrition, severe . AKI (acute kidney injury) (HCC) . (Resolved) Foot osteomyelitis, left (HCC)  I have reviewed the medical record, interviewed the patient and family, and examined the patient. The following aspects are pertinent.  Past Medical History:  Diagnosis Date  . Arthritis    "probably in  my hands" (11/24/2017)  . Cellulitis of left foot 11/24/2017  . High cholesterol   . Hypertension   . Osteomyelitis of left foot (HCC)    Osteomyelitis of left hallux with overlying cellulitis left foot/notes 11/24/2017  . Tobacco abuse   . Tuberculosis ~ 1990   "had tx for it"  . Type II diabetes mellitus (HCC)   . Wound infection 03/07/2018   LEFT FOOT   Social History   Socioeconomic History  . Marital status: Single    Spouse name: Not on file  . Number of children: Not on file  . Years of education: Not on file  . Highest education level: Not on file  Occupational History  . Not on file  Tobacco Use  . Smoking status: Current Every Day Smoker    Packs/day: 1.00    Years: 46.00    Pack years: 46.00    Types: Cigarettes  . Smokeless tobacco: Former Neurosurgeon    Types: Sports administrator  Substance and Sexual Activity  . Alcohol use: Yes    Alcohol/week: 6.0 standard drinks    Types: 6 Cans of beer per week  . Drug use: Yes    Types: Marijuana    Comment: 11/24/2017 "nothing since ~ 2002"  . Sexual activity: Never  Other Topics Concern  . Not on file  Social History Narrative  . Not on file   Social Determinants of Health   Financial Resource Strain:   . Difficulty of Paying Living Expenses: Not on file  Food Insecurity:   . Worried About Programme researcher, broadcasting/film/video in the Last Year: Not on file  . Ran Out of Food in the Last Year: Not on file  Transportation Needs:   . Lack of Transportation (Medical): Not on file  . Lack of Transportation (Non-Medical): Not on file  Physical Activity:   . Days of Exercise per Week: Not on file  . Minutes of Exercise per Session: Not on file  Stress:   . Feeling of Stress : Not on file  Social Connections:   . Frequency of Communication with Friends and Family: Not on file  . Frequency of Social Gatherings with Friends and Family: Not on file  . Attends Religious Services: Not on file  . Active Member of Clubs or Organizations: Not on file  .  Attends Banker Meetings: Not on file  . Marital Status: Not on file   Family History  Problem Relation Age of Onset  . Cancer Father   . Cancer Sister   . Diabetes Mellitus II Other        States 6 out of 13 of his siblings    Scheduled Meds: . chlorhexidine  15 mL Mouth Rinse BID  . Chlorhexidine Gluconate Cloth  6 each Topical Daily  . collagenase   Topical TID  . folic acid  1 mg Oral Daily  . heparin injection (subcutaneous)  5,000 Units Subcutaneous Q8H  . insulin aspart  0-9 Units Subcutaneous TID WC  . mouth rinse  15 mL Mouth Rinse q12n4p  . multivitamins with iron  1 tablet Oral Daily  . nicotine  21 mg Transdermal Daily  . sodium hypochlorite   Irrigation TID  . vancomycin variable dose per unstable renal function (pharmacist dosing)   Does not apply See admin instructions   Continuous Infusions: . ceFEPime (MAXIPIME) IV Stopped (12/28/19 2150)  . dextrose 5 % and 0.9% NaCl 125 mL/hr at Jan 07, 2020 0754  . metronidazole Stopped (Jan 07, 2020 0656)  . thiamine injection     PRN Meds:.Gerhardt's butt cream, LORazepam **OR** LORazepam Medications Prior to Admission:  Prior to Admission medications   Medication Sig Start Date End Date Taking? Authorizing Provider  TRUEPLUS LANCETS 26G MISC Use as directed 03/14/18  Yes Hoy Register, MD  Blood Glucose Monitoring Suppl (TRUE METRIX METER) DEVI 1 each by Does not apply route daily. 11/29/17   Scherrie Gerlach, MD  glucose blood (TRUE METRIX BLOOD GLUCOSE TEST) test strip Use as instructed 03/14/18   Hoy Register, MD   No Known Allergies Review of Systems  Unable to perform ROS: Acuity of condition   Physical Exam Vitals and nursing note reviewed.  Constitutional:      Appearance: He is ill-appearing and toxic-appearing.     Comments: Lethargic, unresponsive to verbal. Groans to sternal rubs  HENT:     Head: Normocephalic.     Nose: Nose normal.  Cardiovascular:     Rate and Rhythm: Tachycardia present.  Pulmonary:     Effort: Pulmonary effort is normal.  Abdominal:     General: Abdomen is flat.     Palpations: Abdomen is soft.  Musculoskeletal:     Comments: Necrotizing feet, TMA on LLE  Skin:    Comments: (+) dry gangrene on BLE, (+) multiple areas of ecchymosis    Vital Signs: BP (!) 97/40   Pulse (!) 107   Temp 99.1 F (37.3 C)   Resp 14   Ht 5\' 11"  (1.803 m)   Wt 77.1 kg   SpO2 99%   BMI 23.71 kg/m  Pain Scale: Faces     SpO2: SpO2: 99 % O2 Device:SpO2: 99 % O2 Flow Rate: .O2 Flow Rate (L/min): 2 L/min  IO: Intake/output summary:   Intake/Output Summary (Last 24 hours) at 12/28/2019 0927 Last data filed at 01/01/2020 0900 Gross per 24 hour  Intake 1826.44 ml  Output 3350 ml  Net -1523.56 ml   LBM: Last BM Date: 12/28/19 Baseline Weight: Weight: 77.1 kg Most recent weight: Weight: 77.1 kg     Palliative Assessment/Data: 20%  Time In: 0830 Time Out: 0940 Time Total: 70 Greater than 50%  of this time was spent counseling and coordinating care related to the above assessment and plan.  Signed by: [id number], NP   Please contact Palliative Medicine Team phone at [phone] for questions and concerns.  For individual provider: See [phone]

## 2019-12-29 NOTE — Progress Notes (Signed)
  Echocardiogram 2D Echocardiogram has been performed.  Mark Graham 01/10/2020, 10:38 AM

## 2019-12-29 NOTE — Progress Notes (Addendum)
   Palliative Medicine Inpatient Follow Up Note   In the afternoon I met with Mark Graham he and I discussed in person Mark Graham's vascular diease and his lack of desire for not having his legs amputated. Mark Graham also expressed that he does not feel this is what Mark Graham would want his legs amputated. He shared that Mark Graham has struggled for many years with noncompliance issues. We talked about the reality of alcoholism and how it is often larger than the ability for a person to control it.   Mark Graham shared that Mark Graham is one of thirteen children and that there are only six children left. Mark Graham shares that he had expected Mark Graham would eventually end up in this situation and expresses a lack of surprise. He shared how all of his other siblings had died. He expressed to me that his wife and one of his sons died.   We discussed focusing on comfort and symptoms management as opposed to curative as we are unlikely to cure Mark Graham's underlying condition without surgery and even if this was elected for that Mark Graham would continue to have poor compliance and decreased likelihood for appropriate healing. Mark Graham said that he would presently like to continue antibiotics, but agreed with more of a symptom focused emphasis.  We talked about residential hospice placement. [facility] and had requested that a referral is sent there.   Recommendations and Plan: DNAR/DNI  No invasive interventions  TOC referral for Mark Graham  Time In: 1200 Time Out: 1300 Time Spent: 60 Greater than 50% of the time was spent in counseling and coordination of care ______________________________________________________________________________________ [place] Team Team Cell Phone: [phone] Please utilize secure chat with additional questions, if there is no response within 30 minutes please call the above phone number  Palliative Medicine Team providers are  available by phone from 7am to 7pm daily and can be reached through the team cell phone.  Should this patient require assistance outside of these hours, please call the patient's attending physician.

## 2019-12-30 MED ORDER — MORPHINE SULFATE (CONCENTRATE) 10 MG /0.5 ML PO SOLN: 10.0000 mg | ORAL | 0 refills | Status: AC | PRN

## 2019-12-30 MED ORDER — LORAZEPAM 0.5 MG PO TABS: 0.5000 mg | ORAL_TABLET | ORAL | 0 refills | Status: AC | PRN

## 2019-12-30 MED ORDER — ONDANSETRON HCL 4 MG PO TABS: 4.0000 mg | ORAL_TABLET | Freq: Every day | ORAL | 1 refills | Status: AC | PRN

## 2019-12-30 MED ORDER — GLYCOPYRROLATE 1 MG PO TABS: 1.0000 mg | ORAL_TABLET | Freq: Three times a day (TID) | ORAL | 0 refills | Status: AC | PRN

## 2019-12-30 NOTE — Progress Notes (Signed)
[id number] [facility] Hospital Liaison: Note @1150   Received request from CSW for family interest in [facility]. Chart reviewed and spoke with family to acknowledge referral. Unfortunately [facility] is not able to offer a room today. Family and CSW are aware [facility] liaison will follow up tomorrow or sooner if room becomes available.   Please do not hesitate to call with questions.  Thank you.  [facility], RN, BSN [facility] [phone]  Liaisons are on AMION

## 2019-12-30 NOTE — Progress Notes (Signed)
   Palliative Medicine Inpatient Follow Up Note   Followed up this morning on patient. He remains to be somnolent. I spoke with his sister, Velna Hatchet via telephone. Velna Hatchet shared that Dahir has had many struggles for quite some time. She expressed that she had hoped his situation may be different but realizes the severity of his illness. She expressed too that he would not fair well if both legs were to be amputated. I provided therapeutic listening as a form of emotional support.  Velna Hatchet and Duard's family are all in agreement for more of a comfort oriented approach to care and for him to transition to [facility] once a bed becomes available.   Recommendations and Plan: DNAR/DNI  No invasive interventions  Comfort oriented care  Awaiting a bed at [facility]  Time Spent: 25 Greater than 50% of the time was spent in counseling and coordination of care ______________________________________________________________________________________ Lamarr Lulas [facility] Palliative Medicine Team Team Cell Phone: [phone] Please utilize secure chat with additional questions, if there is no response within 30 minutes please call the above phone number  Palliative Medicine Team providers are available by phone from 7am to 7pm daily and can be reached through the team cell phone.  Should this patient require assistance outside of these hours, please call the patient's attending physician.

## 2019-12-30 NOTE — Progress Notes (Signed)
PROGRESS NOTE  Mark Graham ZOX:[id number] DOB: 1956/03/16   PCP: [name], MD  Patient is from: Home.  Lives alone.  DOA: 01/24/2020 LOS: 3  Brief Narrative / Interim history: 64 year old male with history of DM-2, alcohol abuse, tobacco use, psychotic disorder, ambulatory dysfunction and HTN presenting with altered mental status after found sitting in how own stool by his brother for 48 minutes.  He was admitted with sepsis due to necrotic bilateral lower extremity wounds, hyponatremia, AKI, hyperkalemia, alcohol intoxication/withdrawal and metabolic encephalopathy. Reportedly drinks about a sixpack of beer daily but hasn't eaten in a while.  In ED, slightly tachycardic and tachypneic.  Mental status improved.  WBC 28.  Na 109.  K6.1.  CO2 15.  AG 18.  Glucose 184.   Cr 3.67 (normal baseline).  AST 70.  T bili 1.4.  Internal level 22.  CK and magnesium within normal.  Blood cultures obtained.  CXR with left basilar atelectasis. RUQ [place] with fatty liver, gallbladder sludge and mild right hydronephrosis.  Started on vancomycin and Zosyn with IV fluid. PCCM consulted but felt stepdown bed is appropriate.  Antibiotics changed to vancomycin, cefepime and Flagyl.  Vascular surgery consulted and suggested bilateral AKA or palliative care.  Palliative care consulted and discussed with patient and family, and initiated full comfort measures point 01/20/2020.  Patient will be transferred to [facility] when bed available. Family wish to continue IV fluid and antibiotics until patient is transferred to [facility].  Subjective: No major events overnight or this morning.  Sleepy but wakes to voice.  He is oriented to self and place. He says "I was told I am going to die". However, he doesn't seems to comprehend or understand the reason for his hospitalization.  He denies pain.  Does not appear to be in distress. Objective: Vitals:   01/23/2020 1400 12/28/2019 1500 01/20/2020 2035 12/30/19 0456  BP: (!)  88/59  (!) 96/55 (!) 92/58  Pulse: (!) 103  (!) 109 95  Resp: 12     Temp: 98.2 F (36.8 C)  98.4 F (36.9 C) (!) 97 F (36.1 C)  TempSrc: Bladder Bladder    SpO2: 99%  97% 90%  Weight:      Height:        Intake/Output Summary (Last 24 hours) at 12/30/2019 1333 Last data filed at 12/30/2019 0704 Gross per 24 hour  Intake 2147.31 ml  Output 2350 ml  Net -202.69 ml   Filed Weights   01/20/2020 1758  Weight: 77.1 kg    Examination:  GENERAL: Disheveled and chronically ill-appearing. HEENT: MMM.  Vision and hearing grossly intact.  NECK: Supple.  No apparent JVD.  RESP: On 2 L by [place].  No IWOB.  Fair aeration with rhonchi bilaterally. CVS:  RRR. Heart sounds normal.  ABD/GI/GU: Bowel sounds present. Soft.  Tender to palpation.  Indwelling Foley. MSK/EXT:  Moves extremities. s/p transmetatarsal amputation of left foot.  Gangrenous changes over both feet.  Scattered skin wounds proximally. SKIN: Gangrenous skin changes over both feet.  No palpable DP pulses bilaterally.  Multiple sacral decubitus. Crusted skin wounds over medial aspect of both hands. NEURO: Awake, alert and oriented self and place only.  No apparent focal neuro deficit. PSYCH: Calm.  Flat affect.  Limited insight.  Procedures:  None  Assessment & Plan: End-of-life care/DNR/DNI -Patient is full comfort measure waiting for bed at [facility] -Continuing IV antibiotic and IV fluid per family's wish until patient is transferred to [facility]  Severe sepsis with AKI and encephalopathy secondary to E. coli bacteremia, possible UTI and gangrenous BLE with multiple wounds in patient with poorly controlled diabetes: Blood culture with E. Coli.  MRSA screen negative.  UA with pyuria. -Vancomycin 3/4-3/5.  Zosyn 3/4.  Cefepime 3/4-3/5 -Continue ceftriaxone and Flagyl per family's wish until transfer to [facility]. -Appreciate vascular surgery input -Appreciate WOC input -Appreciate palliative care  input  Hypotension: Soft blood pressures.  Echocardiogram basically normal. -Antibiotics as above -Continue IV fluid until transfer to [facility] per family wish  Acute renal failure with azotemia: Cr 0.95 (in 2019)> 3.67 (admit)> 3.52.  Likely due to dehydration, sepsis and obstructive uropathy from acute urinary retention with moderate left hydronephrosis. -Continue indwelling Foley catheter -No further blood draws as patient is comfort care  Acute metabolic encephalopathy due to sepsis, alcohol intoxication, hyponatremia and renal failure  Hyponatremia: Likely chronic from alcohol use.  Improved but no further monitoring  Hyperkalemia: Likely due to renal failure.  Resolved.  Controlled DM-2 with hyperglycemia and lower extremity wounds: A1c 6.5%. Recent Labs    01/06/2020 0801 01/16/2020 1202 01/02/2020 1630  GLUCAP 80 133* 130*  -No further monitoring or SSI   Alcohol use disorder with severe dependence: reportedly drinks about 6 packs a day.  EtOH level 22 on admission.  Received banana bag with multivitamins. -Ativan for anxiety  Lactic acidosis: Likely due to sepsis, alcohol and renal failure.  Improved.  Anion gap metabolic acidosis: Likely due to sepsis, alcoholic renal failure.  Tobacco use disorder: Reportedly smokes about a pack a day -Nicotine patch  Debility/physical deconditioning/inability to care for self  Failure to thrive  Thrombocytopenia  Acute urinary retention with moderate left hydronephrosis -Continue indwelling Foley catheter  Complex right renal cyst-complex cyst about 3.7 cm noted on renal ultrasound.  Bilateral sacral decubitus ulcer-POA Multiple upper and lower extremity ulcers-ranging from stage I to stage II-POA Pressure Injury 12/28/19 Sacrum Bilateral Unstageable - Full thickness tissue loss in which the base of the injury is covered by slough (yellow, tan, gray, green or brown) and/or eschar (tan, brown or black) in the wound bed.  (Active)  12/28/19   Location: Sacrum  Location Orientation: Bilateral  Staging: Unstageable - Full thickness tissue loss in which the base of the injury is covered by slough (yellow, tan, gray, green or brown) and/or eschar (tan, brown or black) in the wound bed.  Wound Description (Comments):   Present on Admission: Yes              DVT prophylaxis: Start subcu heparin Code Status: DNR/DNI Family Communication: Updated patient's brother, [name] over the phone on 3/4  Discharge barrier: Residential hosp[facility] bed [facility] Patient is from: Home. Final disposition: [facility]  Consultants: Vascular surgery, palliative care   Microbiology summarized: COVID-19 negative. Influenza PCR negative. Blood cultures -E. coli Urine culture -not collected  Sch Meds:  Scheduled Meds: . chlorhexidine  15 mL Mouth Rinse BID  . collagenase   Topical TID  . folic acid  1 mg Oral Daily  . heparin injection (subcutaneous)  5,000 Units Subcutaneous Q8H  . insulin aspart  0-9 Units Subcutaneous TID WC  . mouth rinse  15 mL Mouth Rinse q12n4p  . multivitamins with iron  1 tablet Oral Daily  . nicotine  21 mg Transdermal Daily  . sodium hypochlorite   Irrigation TID   Continuous Infusions: . cefTRIAXone (ROCEPHIN)  IV 2 g (01/07/2020 1459)  . dextrose 5 % and 0.9%  NaCl 100 mL/hr at 12/30/19 0301  . metronidazole 500 mg (12/30/19 [id number])  . thiamine injection 250 mg (12/28/2019 0930)  . vancomycin 1,250 mg (01/01/2020 1654)   PRN Meds:.  Antimicrobials: Anti-infectives (From admission, onward)   Start     Dose/Rate Route Frequency Ordered Stop   01/01/2020 1600  vancomycin (VANCOREADY) IVPB 1250 mg/250 mL     1,250 mg 166.7 mL/hr over 90 Minutes Intravenous Every 48 hours 12/27/2019 1454     01/12/2020 1400  cefTRIAXone (ROCEPHIN) 2 g in sodium chloride 0.9 % 100 mL IVPB     2 g 200 mL/hr over 30 Minutes Intravenous Every 24 hours 01/16/2020 1252     12/28/19 2200  metroNIDAZOLE (FLAGYL)  IVPB 500 mg     500 mg 100 mL/hr over 60 Minutes Intravenous Every 8 hours 12/28/19 1355     12/28/19 1800  ceFEPIme (MAXIPIME) 2 g in sodium chloride 0.9 % 100 mL IVPB  Status:  Discontinued     2 g 200 mL/hr over 30 Minutes Intravenous Every 24 hours 12/28/19 1355 01/15/2020 1252   12/28/19 0245  piperacillin-tazobactam (ZOSYN) IVPB 3.375 g  Status:  Discontinued     3.375 g 12.5 mL/hr over 240 Minutes Intravenous Every 8 hours 08/02/2020 2048 12/28/19 1355   08/02/2020 2042  vancomycin variable dose per unstable renal function (pharmacist dosing)  Status:  Discontinued      Does not apply See admin instructions 08/02/2020 2042 01/01/2020 1252   08/02/2020 1815  vancomycin (VANCOREADY) IVPB 1500 mg/300 mL     1,500 mg 150 mL/hr over 120 Minutes Intravenous  Once 08/02/2020 1810 08/02/2020 2159   08/02/2020 1815  piperacillin-tazobactam (ZOSYN) IVPB 3.375 g     3.375 g 100 mL/hr over 30 Minutes Intravenous  Once 08/02/2020 1810 08/02/2020 1913       I have personally reviewed the following labs and images: CBC: Recent Labs  Lab 08/02/2020 1832 12/28/19 0420 01/21/2020 0242  WBC 28.5* 21.3* 14.8*  NEUTROABS 26.9*  --   --   HGB 13.6 11.1* 10.2*  HCT 38.4* 31.7* 28.2*  MCV 93.2 92.2 91.9  PLT 191 136* 102*   BMP &GFR Recent Labs  Lab 08/02/2020 1832 12/28/19 0134 12/28/19 0420 12/28/19 1106 12/28/19 1612 12/28/19 2312 12/25/2019 0242 01/02/2020 0923  NA 109*   < > 116*  --  117* 119* 119* 122*  K 6.1*   < > 5.9*  --  6.2* 5.4* 5.3* 4.9  CL 76*   < > 86*  --  86* 92* 91* 94*  CO2 15*   < > 15*  --  16* 16* 15* 14*  GLUCOSE 184*   < > 110*  --  93 89 76 98  BUN 63*   < > 63*  --  72* 70* 72* 72*  CREATININE 3.67*   < > 3.43*  --  3.88* 3.74* 3.51* 3.52*  CALCIUM 8.1*   < > 7.0*  --  7.4* 7.4* 7.4* 7.2*  MG 1.9  --   --  1.7  --   --  1.7  --   PHOS 6.3*  --   --  6.7*  --   --  5.6*  --    < > = values in this interval not displayed.   Estimated Creatinine Clearance: 22.9 mL/min (A) (by C-G  formula based on SCr of 3.52 mg/dL (H)). Liver & Pancreas: Recent Labs  Lab 08/02/2020 1832  AST 70*  ALT 39  ALKPHOS  132*  BILITOT 1.4*  PROT 6.5  ALBUMIN 2.0*   No results for input(s): LIPASE, AMYLASE in the last 168 hours. No results for input(s): AMMONIA in the last 168 hours. Diabetic: Recent Labs    01/22/2020 1832  HGBA1C 6.5*   Recent Labs  Lab 01/15/20 0001 2020/01/15 0417 January 15, 2020 0801 01/15/20 1202 2020/01/15 1630  GLUCAP 81 87 80 133* 130*   Cardiac Enzymes: Recent Labs  Lab 01/03/2020 1832  CKTOTAL 392   No results for input(s): PROBNP in the last 8760 hours. Coagulation Profile: Recent Labs  Lab 12/26/2019 1832 01-15-2020 0242  INR 1.3* 1.2   Thyroid Function Tests: Recent Labs    January 15, 2020 0923  TSH 2.435   Lipid Profile: No results for input(s): CHOL, HDL, LDLCALC, TRIG, CHOLHDL, LDLDIRECT in the last 72 hours. Anemia Panel: No results for input(s): VITAMINB12, FOLATE, FERRITIN, TIBC, IRON, RETICCTPCT in the last 72 hours. Urine analysis:    Component Value Date/Time   COLORURINE AMBER (A) 12/28/2019 0420   APPEARANCEUR HAZY (A) 12/28/2019 0420   LABSPEC 1.015 12/28/2019 0420   PHURINE 5.0 12/28/2019 0420   GLUCOSEU NEGATIVE 12/28/2019 0420   HGBUR MODERATE (A) 12/28/2019 0420   BILIRUBINUR NEGATIVE 12/28/2019 0420   KETONESUR NEGATIVE 12/28/2019 0420   PROTEINUR 30 (A) 12/28/2019 0420   UROBILINOGEN 1.0 01/15/2015 1946   NITRITE NEGATIVE 12/28/2019 0420   LEUKOCYTESUR MODERATE (A) 12/28/2019 0420   Sepsis Labs: Invalid input(s): PROCALCITONIN, LACTICIDVEN  Microbiology: Recent Results (from the past 240 hour(s))  Blood Culture (routine x 2)     Status: Abnormal (Preliminary result)   Collection Time: 01/10/2020  6:11 PM   Specimen: BLOOD LEFT ARM  Result Value Ref Range Status   Specimen Description BLOOD LEFT ARM  Final   Special Requests   Final    BOTTLES DRAWN AEROBIC AND ANAEROBIC Blood Culture adequate volume   Culture  Setup  Time   Final    GRAM NEGATIVE RODS AEROBIC BOTTLE ONLY CRITICAL RESULT CALLED TO, READ BACK BY AND VERIFIED WITH: Gearlean Alf PharmD 13:35 12/28/19 ([email or web address])    Culture (A)  Final    ESCHERICHIA COLI REPEATING SUSCEPTIBILITIES Performed at [facility], [address], [place], [place] [address]    Report Status PENDING  Incomplete  Blood Culture ID Panel (Reflexed)     Status: Abnormal   Collection Time: 12/28/2019  6:11 PM  Result Value Ref Range Status   Enterococcus species NOT DETECTED NOT DETECTED Final   Listeria monocytogenes NOT DETECTED NOT DETECTED Final   Staphylococcus species NOT DETECTED NOT DETECTED Final   Staphylococcus aureus (BCID) NOT DETECTED NOT DETECTED Final   Streptococcus species NOT DETECTED NOT DETECTED Final   Streptococcus agalactiae NOT DETECTED NOT DETECTED Final   Streptococcus pneumoniae NOT DETECTED NOT DETECTED Final   Streptococcus pyogenes NOT DETECTED NOT DETECTED Final   Acinetobacter baumannii NOT DETECTED NOT DETECTED Final   Enterobacteriaceae species DETECTED (A) NOT DETECTED Final    Comment: Enterobacteriaceae represent a large family of gram-negative bacteria, not a single organism. CRITICAL RESULT CALLED TO, READ BACK BY AND VERIFIED WITH: Gearlean Alf PharmD 13:35 12/28/19 ([email or web address])    Enterobacter cloacae complex NOT DETECTED NOT DETECTED Final   Escherichia coli DETECTED (A) NOT DETECTED Final    Comment: CRITICAL RESULT CALLED TO, READ BACK BY AND VERIFIED WITH: Gearlean Alf PharmD 13:35 12/28/19 ([email or web address])    Klebsiella oxytoca NOT DETECTED NOT DETECTED Final   Klebsiella pneumoniae NOT DETECTED NOT DETECTED Final  Proteus species NOT DETECTED NOT DETECTED Final   Serratia marcescens NOT DETECTED NOT DETECTED Final   Carbapenem resistance NOT DETECTED NOT DETECTED Final   Haemophilus influenzae NOT DETECTED NOT DETECTED Final   Neisseria meningitidis NOT DETECTED NOT DETECTED Final   Pseudomonas aeruginosa NOT DETECTED NOT  DETECTED Final   Candida albicans NOT DETECTED NOT DETECTED Final   Candida glabrata NOT DETECTED NOT DETECTED Final   Candida krusei NOT DETECTED NOT DETECTED Final   Candida parapsilosis NOT DETECTED NOT DETECTED Final   Candida tropicalis NOT DETECTED NOT DETECTED Final    Comment: Performed at [facility], [address], [place], [place] [address]  Blood Culture (routine x 2)     Status: Abnormal (Preliminary result)   Collection Time: Jan 25, 2020  6:32 PM   Specimen: BLOOD  Result Value Ref Range Status   Specimen Description BLOOD SITE NOT SPECIFIED  Final   Special Requests   Final    BOTTLES DRAWN AEROBIC AND ANAEROBIC Blood Culture adequate volume   Culture  Setup Time   Final    GRAM NEGATIVE RODS IN BOTH AEROBIC AND ANAEROBIC BOTTLES CRITICAL VALUE NOTED.  VALUE IS CONSISTENT WITH PREVIOUSLY REPORTED AND CALLED VALUE. Performed at [facility], [address], [place], [place] [address]    Culture ESCHERICHIA COLI (A)  Final   Report Status PENDING  Incomplete  Respiratory Panel by RT PCR (Flu A&B, Covid) - Nasopharyngeal Swab     Status: None   Collection Time: January 25, 2020  7:44 PM   Specimen: Nasopharyngeal Swab  Result Value Ref Range Status   SARS Coronavirus 2 by RT PCR NEGATIVE NEGATIVE Final    Comment: (NOTE) SARS-CoV-2 target nucleic acids are NOT DETECTED. The SARS-CoV-2 RNA is generally detectable in upper respiratoy specimens during the acute phase of infection. The lowest concentration of SARS-CoV-2 viral copies this assay can detect is 131 copies/mL. A negative result does not preclude SARS-Cov-2 infection and should not be used as the sole basis for treatment or other patient management decisions. A negative result may occur with  improper specimen collection/handling, submission of specimen other than nasopharyngeal swab, presence of viral mutation(s) within the areas targeted by this assay, and inadequate number of viral copies (<131  copies/mL). A negative result must be combined with clinical observations, patient history, and epidemiological information. The expected result is Negative. Fact Sheet for Patients:  https://www.moore.com/ Fact Sheet for Healthcare Providers:  https://www.young.biz/ This test is not yet ap proved or cleared by the [place] FDA and  has been authorized for detection and/or diagnosis of SARS-CoV-2 by FDA under an Emergency Use Authorization (EUA). This EUA will remain  in effect (meaning this test can be used) for the duration of the COVID-19 declaration under Section 564(b)(1) of the Act, 21 U.S.C. section 360bbb-3(b)(1), unless the authorization is terminated or revoked sooner.    Influenza A by PCR NEGATIVE NEGATIVE Final   Influenza B by PCR NEGATIVE NEGATIVE Final    Comment: (NOTE) The Xpert Xpress SARS-CoV-2/FLU/RSV assay is intended as an aid in  the diagnosis of influenza from Nasopharyngeal swab specimens and  should not be used as a sole basis for treatment. Nasal washings and  aspirates are unacceptable for Xpert Xpress SARS-CoV-2/FLU/RSV  testing. Fact Sheet for Patients: https://www.moore.com/ Fact Sheet for Healthcare Providers: https://www.young.biz/ This test is not yet approved or cleared by the [place] FDA and  has been authorized for detection and/or diagnosis of SARS-CoV-2 by  FDA  under an Emergency Use Authorization (EUA). This EUA will remain  in effect (meaning this test can be used) for the duration of the  Covid-19 declaration under Section 564(b)(1) of the Act, 21  U.S.C. section 360bbb-3(b)(1), unless the authorization is  terminated or revoked. Performed at [facility], [address] [place], [place] [address]   Urine culture     Status: Abnormal   Collection Time: 12/28/19  7:49 AM   Specimen: In/Out Cath Urine  Result Value Ref Range Status   Specimen  Description IN/OUT CATH URINE  Final   Special Requests   Final    NONE Performed at [facility], [address], [place], [place] [address]    Culture MULTIPLE SPECIES PRESENT, SUGGEST RECOLLECTION (A)  Final   Report Status 12/28/2019 FINAL  Final  Surgical pcr screen     Status: None   Collection Time: 12/28/19 11:23 PM   Specimen: Nasal Mucosa; Nasal Swab  Result Value Ref Range Status   MRSA, PCR NEGATIVE NEGATIVE Final   Staphylococcus aureus NEGATIVE NEGATIVE Final    Comment: (NOTE) The Xpert SA Assay (FDA approved for NASAL specimens in patients 57 years of age and older), is one component of a comprehensive surveillance program. It is not intended to diagnose infection nor to guide or monitor treatment. Performed at [facility], [address], [place], [place] [address]     Radiology Studies: No results found.     Glenys Snader T. Joann Jorge [facility]  If 7PM-7AM, please contact night-coverage [email or web address] Password [email or web address] 12/30/2019, 1:33 PM

## 2019-12-30 NOTE — TOC Progression Note (Signed)
Transition of Care [facility]) - Progression Note    Patient Details  Name: Mark Graham MRN: [id number] Date of Birth: Mar 07, 1956  [facility]) CM/SW Contact  Eduard Roux, [place] Phone Number: 12/30/2019, 10:11 AM  Clinical Narrative:     CSW contacted Jennifer/[facility]- they are working on placement and will contact CSW if able to admit today.  Antony Blackbird, MSW, LCSWA Clinical Social Worker        Expected Discharge Plan and Services                                                 Social Determinants of Health (SDOH) Interventions    Readmission Risk Interventions No flowsheet data found.

## 2019-12-30 NOTE — Plan of Care (Signed)
  Problem: Education: Goal: Knowledge of General Education information will improve Description Including pain rating scale, medication(s)/side effects and non-pharmacologic comfort measures Outcome: Progressing   Problem: Health Behavior/Discharge Planning: Goal: Ability to manage health-related needs will improve Outcome: Progressing   

## 2019-12-31 DIAGNOSIS — R6520 Severe sepsis without septic shock: Secondary | ICD-10-CM | POA: Diagnosis present

## 2019-12-31 DIAGNOSIS — E1152 Type 2 diabetes mellitus with diabetic peripheral angiopathy with gangrene: Secondary | ICD-10-CM

## 2019-12-31 DIAGNOSIS — R7881 Bacteremia: Secondary | ICD-10-CM | POA: Diagnosis present

## 2019-12-31 DIAGNOSIS — A4150 Gram-negative sepsis, unspecified: Secondary | ICD-10-CM | POA: Diagnosis present

## 2019-12-31 LAB — CULTURE, BLOOD (ROUTINE X 2)
Special Requests: ADEQUATE
Special Requests: ADEQUATE

## 2020-01-25 NOTE — Death Summary Note (Signed)
DEATH SUMMARY   Patient Details  Name: Mark Graham MRN: [id number] DOB: 05/24/1956  Admission/Discharge Information   Admit Date:  01/24/2020  Date of Death: Date of Death: 01/18/2020  Time of Death: Time of Death: 0458  Length of Stay: 4  Referring Physician: Hoy RegisterNewlin, Enobong, MD   Reason(s) for Hospitalization  Altered mental status  Diagnoses  Preliminary cause of death: Severe sepsis with acute organ dysfunction due to Gram negative bacteria (HCC) Secondary Diagnoses (including complications and co-morbidities):  End-of-life care/DNR/DNI  Severe sepsis with AKI and encephalopathy secondary to E. coli bacteremia, possible UTI and gangrenous BLE with multiple wounds in patient with poorly controlled diabetes: Blood culture with E. Coli.  MRSA screen negative.  UA with pyuria. -Vancomycin 3/4-3/5.  Zosyn 3/4.  Cefepime 3/4-3/5 -Continue ceftriaxone and Flagyl per family's wish until transfer to [facility]. -Evaluated by vascular surgery, [facility] palliative care  Hypotension: Soft blood pressures.  Echocardiogram basically normal.  Acute renal failure with azotemia: Cr 0.95 (in 2019)> 3.67 (admit)> 3.52.  Likely due to dehydration, sepsis and obstructive uropathy from acute urinary retention with moderate left hydronephrosis.  Acute metabolic encephalopathy due to sepsis, alcohol intoxication, hyponatremia and renal failure  Hyponatremia: Likely chronic from alcohol use.  Improved but no further monitoring  Hyperkalemia: Likely due to renal failure.  Resolved.  Controlled DM-2 with hyperglycemia and lower extremity wounds: A1c 6.5%.  Alcohol use disorder with severe dependence: reportedly drinks about 6 packs a day.  EtOH level 22 on admission.  Received banana bag with multivitamins.  Lactic acidosis: Likely due to sepsis, alcohol and renal failure.   Anion gap metabolic acidosis: Likely due to sepsis, alcoholic renal failure.  Tobacco use disorder: Reportedly  smokes about a pack a day  Debility/physical deconditioning/inability to care for self  Failure to thrive  Thrombocytopenia  Acute urinary retention with moderate left hydronephrosis  Complex right renal cyst-complex cyst about 3.7 cm noted on renal ultrasound.  Bilateral sacral decubitus ulcer-POA Multiple upper and lower extremity ulcers-ranging from stage I to stage II-POA Pressure Injury 12/28/19 Sacrum Bilateral Unstageable - Full thickness tissue loss in which the base of the injury is covered by slough (yellow, tan, gray, green or brown) and/or eschar (tan, brown or black) in the wound bed. (Active)  12/28/19   Location: Sacrum  Location Orientation: Bilateral  Staging: Unstageable - Full thickness tissue loss in which the base of the injury is covered by slough (yellow, tan, gray, green or brown) and/or eschar (tan, brown or black) in the wound bed.  Wound Description (Comments):   Present on Admission: Yes     Brief Hospital Course (including significant findings, care, treatment, and services provided and events leading to death)  64 year old male with history of DM-2, alcohol abuse, tobacco use, psychotic disorder, ambulatory dysfunction and HTN presenting with altered mental status after found sitting in how own stool by his brother for 48 minutes.  He was admitted with sepsis due to necrotic bilateral lower extremity wounds, hyponatremia, AKI, hyperkalemia, alcohol intoxication/withdrawal and metabolic encephalopathy. Reportedly drinks about a sixpack of beer daily but hasn't eaten in a while.  In ED, slightly tachycardic and tachypneic.  Mental status improved.  WBC 28.  Na 109.  K6.1.  CO2 15.  AG 18.  Glucose 184.   Cr 3.67 (normal baseline).  AST 70.  T bili 1.4.  Internal level 22.  CK and magnesium within normal.  Blood cultures obtained.  CXR with left basilar atelectasis. RUQ US with  fatty liver, gallbladder sludge and mild right hydronephrosis.  Started on  vancomycin and Zosyn with IV fluid. PCCM consulted but felt stepdown bed is appropriate.  Antibiotics changed to vancomycin, cefepime and Flagyl.  Vascular surgery consulted and suggested bilateral AKA or palliative care.  Palliative care consulted and discussed with patient and family, and initiated full comfort measures on 24-Jan-2020.  Patient passed away on 01-26-2020 at 4:58 AM.  Family notified.  See individual problem list above for more  Pertinent Labs and Studies  Significant Diagnostic Studies US RENAL  Result Date: 12/28/2019 CLINICAL DATA:  Acute kidney injury. EXAM: RENAL / URINARY TRACT ULTRASOUND COMPLETE COMPARISON:  01/23/2017.  A FINDINGS: Right Kidney: Renal measurements: 13.6 x 6.9 x 6.7 cm = volume: 329.2 mL. Mild hydronephrosis. Normal echogenicity. No mass. Left Kidney: Renal measurements: 12.3 x 6.6 x 8.2 cm. = volume: 350.1 mL. Moderate hydronephrosis. Hypoechoic structure arising from upper pole of the right kidney measures 3.7 x 2.3 x 2.8 cm. Bladder: Appears normal for degree of bladder distention. Foley catheter in place. Other: None. IMPRESSION: 1. Mild right hydronephrosis and moderate left hydronephrosis. 2. Complex cyst within upper pole of right kidney is noted measuring 3.7 cm. Recommend follow-up nonemergent cross-sectional imaging, either MRI or CT without and with contrast material for more definitive characterization. Electronically Signed   By: Signa Kell M.D.   On: 12/28/2019 17:45   DG Chest Port 1 View  Result Date: 2020-01-22 CLINICAL DATA:  Failure to thrive EXAM: PORTABLE CHEST 1 VIEW COMPARISON:  12/13/2016 FINDINGS: Left basilar atelectasis. No pleural effusion or pneumothorax. Stable cardiomediastinal contours with normal heart size. IMPRESSION: Left basilar atelectasis. Electronically Signed   By: Guadlupe Spanish M.D.   On: 01-22-20 19:25   ECHOCARDIOGRAM COMPLETE  Result Date: 01-24-2020    ECHOCARDIOGRAM REPORT   Patient Name:   Mark Graham Date of  Exam: 01/24/2020 Medical Rec #:  [id number]    Height:       71.0 in Accession #:    [id number]   Weight:       170.0 lb Date of Birth:  02/04/1956     BSA:          1.968 m Patient Age:    63 years     BP:           97/40 mmHg Patient Gender: M            HR:           106 bpm. Exam Location:  Inpatient Procedure: 2D Echo, Cardiac Doppler and Color Doppler Indications:    Hypotension  History:        Patient has prior history of Echocardiogram examinations, most                 recent 12/14/2016. Abnormal ECG, Signs/Symptoms:Chest Pain; Risk                 Factors:Hypertension, Diabetes, Dyslipidemia and Current Smoker.  Sonographer:    Renella Cunas RDCS Referring Phys: [id number] Almon Hercules  Sonographer Comments: Image acquisition challenging due to respiratory motion. IMPRESSIONS  1. Left ventricular ejection fraction, by estimation, is 65 to 70%. The left ventricle has normal function. The left ventricle has no regional wall motion abnormalities. Left ventricular diastolic parameters are consistent with Grade I diastolic dysfunction (impaired relaxation).  2. Right ventricular systolic function is normal. The right ventricular size is normal. There is normal pulmonary artery systolic pressure.  3. The mitral valve  is normal in structure and function. No evidence of mitral valve regurgitation.  4. The aortic valve is tricuspid. Aortic valve regurgitation is not visualized.  5. The inferior vena cava is normal in size with greater than 50% respiratory variability, suggesting right atrial pressure of 3 mmHg. FINDINGS  Left Ventricle: Left ventricular ejection fraction, by estimation, is 65 to 70%. The left ventricle has normal function. The left ventricle has no regional wall motion abnormalities. The left ventricular internal cavity size was normal in size. There is  no left ventricular hypertrophy. Left ventricular diastolic parameters are consistent with Grade I diastolic dysfunction (impaired relaxation). Right  Ventricle: The right ventricular size is normal. No increase in right ventricular wall thickness. Right ventricular systolic function is normal. There is normal pulmonary artery systolic pressure. The tricuspid regurgitant velocity is 2.14 m/s, and  with an assumed right atrial pressure of 3 mmHg, the estimated right ventricular systolic pressure is 00.8 mmHg. Left Atrium: Left atrial size was normal in size. Right Atrium: Right atrial size was normal in size. Pericardium: There is no evidence of pericardial effusion. Mitral Valve: The mitral valve is normal in structure and function. No evidence of mitral valve regurgitation. Tricuspid Valve: The tricuspid valve is normal in structure. Tricuspid valve regurgitation is trivial. Aortic Valve: The aortic valve is tricuspid. Aortic valve regurgitation is not visualized. There is mild calcification of the aortic valve. Pulmonic Valve: The pulmonic valve was not well visualized. Pulmonic valve regurgitation is trivial. Aorta: The aortic root and ascending aorta are structurally normal, with no evidence of dilitation. Venous: The inferior vena cava is normal in size with greater than 50% respiratory variability, suggesting right atrial pressure of 3 mmHg. IAS/Shunts: No atrial level shunt detected by color flow Doppler.  LEFT VENTRICLE PLAX 2D LVIDd:         4.60 cm  Diastology LVIDs:         3.10 cm  LV e' lateral:   10.30 cm/s LV PW:         0.70 cm  LV E/e' lateral: 8.3 LV IVS:        0.70 cm  LV e' medial:    6.74 cm/s LVOT diam:     2.10 cm  LV E/e' medial:  12.7 LV SV:         77 LV SV Index:   39 LVOT Area:     3.46 cm  RIGHT VENTRICLE RV S prime:     15.70 cm/s TAPSE (M-mode): 2.1 cm LEFT ATRIUM           Index       RIGHT ATRIUM          Index LA diam:      2.70 cm 1.37 cm/m  RA Area:     9.67 cm LA Vol (A2C): 31.5 ml 16.01 ml/m RA Volume:   16.90 ml 8.59 ml/m LA Vol (A4C): 41.3 ml 20.99 ml/m  AORTIC VALVE LVOT Vmax:   126.00 cm/s LVOT Vmean:  86.600 cm/s  LVOT VTI:    0.223 m  AORTA Ao Root diam: 3.40 cm MITRAL VALVE               TRICUSPID VALVE MV Area (PHT): 5.54 cm    TR Peak grad:   18.3 mmHg MV Decel Time: 137 msec    TR Vmax:        214.00 cm/s MV E velocity: 85.90 cm/s MV A velocity: 93.50 cm/s  SHUNTS MV E/A ratio:  0.92        Systemic VTI:  0.22 m                            Systemic Diam: 2.10 cm Arvilla Meres MD Electronically signed by Arvilla Meres MD Signature Date/Time: 01-15-2020/2:33:49 PM    Final    US Abdomen Limited RUQ  Result Date: 01/02/2020 CLINICAL DATA:  64 year old male with abdominal pain. EXAM: ULTRASOUND ABDOMEN LIMITED RIGHT UPPER QUADRANT COMPARISON:  Ultrasound dated 01/23/2017. FINDINGS: Gallbladder: There is layering sludge within the gallbladder. No gallbladder wall thickening or pericholecystic fluid. Negative sonographic Murphy's sign. Common bile duct: Diameter: 5 mm Liver: There is diffuse increased liver echogenicity most commonly seen in the setting of fatty infiltration. Superimposed inflammation or fibrosis is not excluded. Clinical correlation is recommended. Portal vein is patent on color Doppler imaging with normal direction of blood flow towards the liver. Other: There is a mild right hydronephrosis. IMPRESSION: 1. Fatty liver. 2. Gallbladder sludge. No sonographic findings of acute cholecystitis. 3. Mild right hydronephrosis. Electronically Signed   By: Elgie Collard M.D.   On: 01/14/2020 22:38    Microbiology Recent Results (from the past 240 hour(s))  Blood Culture (routine x 2)     Status: Abnormal   Collection Time: 12/30/2019  6:11 PM   Specimen: BLOOD LEFT ARM  Result Value Ref Range Status   Specimen Description BLOOD LEFT ARM  Final   Special Requests   Final    BOTTLES DRAWN AEROBIC AND ANAEROBIC Blood Culture adequate volume   Culture  Setup Time   Final    GRAM NEGATIVE RODS IN BOTH AEROBIC AND ANAEROBIC BOTTLES CRITICAL RESULT CALLED TO, READ BACK BY AND VERIFIED WITH: Gearlean Alf  PharmD 13:35 12/28/19 ([email or web address]) Performed at [facility], [address] [place], [place] [address]    Culture ESCHERICHIA COLI (A)  Final   Report Status 01/14/2020 FINAL  Final   Organism ID, Bacteria ESCHERICHIA COLI  Final      Susceptibility   Escherichia coli - MIC*    AMPICILLIN >=32 RESISTANT Resistant     CEFAZOLIN <=4 SENSITIVE Sensitive     CEFEPIME <=0.12 SENSITIVE Sensitive     CEFTAZIDIME <=1 SENSITIVE Sensitive     CEFTRIAXONE <=0.25 SENSITIVE Sensitive     CIPROFLOXACIN <=0.25 SENSITIVE Sensitive     GENTAMICIN <=1 SENSITIVE Sensitive     IMIPENEM <=0.25 SENSITIVE Sensitive     TRIMETH/SULFA <=20 SENSITIVE Sensitive     AMPICILLIN/SULBACTAM >=32 RESISTANT Resistant     PIP/TAZO <=4 SENSITIVE Sensitive     * ESCHERICHIA COLI  Blood Culture ID Panel (Reflexed)     Status: Abnormal   Collection Time: 01/10/2020  6:11 PM  Result Value Ref Range Status   Enterococcus species NOT DETECTED NOT DETECTED Final   Listeria monocytogenes NOT DETECTED NOT DETECTED Final   Staphylococcus species NOT DETECTED NOT DETECTED Final   Staphylococcus aureus (BCID) NOT DETECTED NOT DETECTED Final   Streptococcus species NOT DETECTED NOT DETECTED Final   Streptococcus agalactiae NOT DETECTED NOT DETECTED Final   Streptococcus pneumoniae NOT DETECTED NOT DETECTED Final   Streptococcus pyogenes NOT DETECTED NOT DETECTED Final   Acinetobacter baumannii NOT DETECTED NOT DETECTED Final   Enterobacteriaceae species DETECTED (A) NOT DETECTED Final    Comment: Enterobacteriaceae represent a large family of gram-negative bacteria, not a single organism. CRITICAL RESULT CALLED TO, READ BACK BY AND VERIFIED WITH: Gearlean Alf PharmD  13:35 12/28/19 ([email or web address])    Enterobacter cloacae complex NOT DETECTED NOT DETECTED Final   Escherichia coli DETECTED (A) NOT DETECTED Final    Comment: CRITICAL RESULT CALLED TO, READ BACK BY AND VERIFIED WITH: Gearlean AlfJ. Steenwyk PharmD 13:35 12/28/19 (wilsonm)     Klebsiella oxytoca NOT DETECTED NOT DETECTED Final   Klebsiella pneumoniae NOT DETECTED NOT DETECTED Final   Proteus species NOT DETECTED NOT DETECTED Final   Serratia marcescens NOT DETECTED NOT DETECTED Final   Carbapenem resistance NOT DETECTED NOT DETECTED Final   Haemophilus influenzae NOT DETECTED NOT DETECTED Final   Neisseria meningitidis NOT DETECTED NOT DETECTED Final   Pseudomonas aeruginosa NOT DETECTED NOT DETECTED Final   Candida albicans NOT DETECTED NOT DETECTED Final   Candida glabrata NOT DETECTED NOT DETECTED Final   Candida krusei NOT DETECTED NOT DETECTED Final   Candida parapsilosis NOT DETECTED NOT DETECTED Final   Candida tropicalis NOT DETECTED NOT DETECTED Final    Comment: Performed at [facility], [address] [place], [place] [address]  Blood Culture (routine x 2)     Status: Abnormal   Collection Time: 01/04/2020  6:32 PM   Specimen: BLOOD  Result Value Ref Range Status   Specimen Description BLOOD SITE NOT SPECIFIED  Final   Special Requests   Final    BOTTLES DRAWN AEROBIC AND ANAEROBIC Blood Culture adequate volume   Culture  Setup Time   Final    GRAM NEGATIVE RODS IN BOTH AEROBIC AND ANAEROBIC BOTTLES CRITICAL VALUE NOTED.  VALUE IS CONSISTENT WITH PREVIOUSLY REPORTED AND CALLED VALUE.    Culture (A)  Final    ESCHERICHIA COLI SUSCEPTIBILITIES PERFORMED ON PREVIOUS CULTURE WITHIN THE LAST 5 DAYS. Performed at [facility], [address] [place], [place] [address]    Report Status 12/30/2019 FINAL  Final  Respiratory Panel by RT PCR (Flu A&B, Covid) - Nasopharyngeal Swab     Status: None   Collection Time: 01/08/2020  7:44 PM   Specimen: Nasopharyngeal Swab  Result Value Ref Range Status   SARS Coronavirus 2 by RT PCR NEGATIVE NEGATIVE Final    Comment: (NOTE) SARS-CoV-2 target nucleic acids are NOT DETECTED. The SARS-CoV-2 RNA is generally detectable in upper respiratoy specimens during the acute phase of infection. The  lowest concentration of SARS-CoV-2 viral copies this assay can detect is 131 copies/mL. A negative result does not preclude SARS-Cov-2 infection and should not be used as the sole basis for treatment or other patient management decisions. A negative result may occur with  improper specimen collection/handling, submission of specimen other than nasopharyngeal swab, presence of viral mutation(s) within the areas targeted by this assay, and inadequate number of viral copies (<131 copies/mL). A negative result must be combined with clinical observations, patient history, and epidemiological information. The expected result is Negative. Fact Sheet for Patients:  https://www.moore.com/https://www.fda.gov/media/142436/download Fact Sheet for Healthcare Providers:  https://www.young.biz/https://www.fda.gov/media/142435/download This test is not yet ap proved or cleared by the [place] FDA and  has been authorized for detection and/or diagnosis of SARS-CoV-2 by FDA under an Emergency Use Authorization (EUA). This EUA will remain  in effect (meaning this test can be used) for the duration of the COVID-19 declaration under Section 564(b)(1) of the Act, 21 U.S.C. section 360bbb-3(b)(1), unless the authorization is terminated or revoked sooner.    Influenza A by PCR NEGATIVE NEGATIVE Final   Influenza B by PCR NEGATIVE NEGATIVE Final    Comment: (NOTE) The Xpert Xpress SARS-CoV-2/FLU/RSV assay is  intended as an aid in  the diagnosis of influenza from Nasopharyngeal swab specimens and  should not be used as a sole basis for treatment. Nasal washings and  aspirates are unacceptable for Xpert Xpress SARS-CoV-2/FLU/RSV  testing. Fact Sheet for Patients: https://www.moore.com/ Fact Sheet for Healthcare Providers: https://www.young.biz/ This test is not yet approved or cleared by the [place] FDA and  has been authorized for detection and/or diagnosis of SARS-CoV-2 by  FDA under an Emergency  Use Authorization (EUA). This EUA will remain  in effect (meaning this test can be used) for the duration of the  Covid-19 declaration under Section 564(b)(1) of the Act, 21  U.S.C. section 360bbb-3(b)(1), unless the authorization is  terminated or revoked. Performed at [facility], [address] [place], [place] [address]   Urine culture     Status: Abnormal   Collection Time: 12/28/19  7:49 AM   Specimen: In/Out Cath Urine  Result Value Ref Range Status   Specimen Description IN/OUT CATH URINE  Final   Special Requests   Final    NONE Performed at [facility], [address] [place], [place] [address]    Culture MULTIPLE SPECIES PRESENT, SUGGEST RECOLLECTION (A)  Final   Report Status 01-13-20 FINAL  Final  Surgical pcr screen     Status: None   Collection Time: 12/28/19 11:23 PM   Specimen: Nasal Mucosa; Nasal Swab  Result Value Ref Range Status   MRSA, PCR NEGATIVE NEGATIVE Final   Staphylococcus aureus NEGATIVE NEGATIVE Final    Comment: (NOTE) The Xpert SA Assay (FDA approved for NASAL specimens in patients 97 years of age and older), is one component of a comprehensive surveillance program. It is not intended to diagnose infection nor to guide or monitor treatment. Performed at [facility], [address], [place], [place] [address]     Lab Basic Metabolic Panel: Recent Labs  Lab 01/20/2020 1832 12/28/19 0134 12/28/19 0420 12/28/19 1106 12/28/19 1612 12/28/19 2312 01-13-2020 0242 2020/01/13 0923  NA 109*   < > 116*  --  117* 119* 119* 122*  K 6.1*   < > 5.9*  --  6.2* 5.4* 5.3* 4.9  CL 76*   < > 86*  --  86* 92* 91* 94*  CO2 15*   < > 15*  --  16* 16* 15* 14*  GLUCOSE 184*   < > 110*  --  93 89 76 98  BUN 63*   < > 63*  --  72* 70* 72* 72*  CREATININE 3.67*   < > 3.43*  --  3.88* 3.74* 3.51* 3.52*  CALCIUM 8.1*   < > 7.0*  --  7.4* 7.4* 7.4* 7.2*  MG 1.9  --   --  1.7  --   --  1.7  --   PHOS 6.3*  --   --  6.7*  --   --  5.6*  --     < > = values in this interval not displayed.   Liver Function Tests: Recent Labs  Lab 01/10/2020 1832  AST 70*  ALT 39  ALKPHOS 132*  BILITOT 1.4*  PROT 6.5  ALBUMIN 2.0*   No results for input(s): LIPASE, AMYLASE in the last 168 hours. No results for input(s): AMMONIA in the last 168 hours. CBC: Recent Labs  Lab 01/16/2020 1832 12/28/19 0420 01-13-20 0242  WBC 28.5* 21.3* 14.8*  NEUTROABS 26.9*  --   --   HGB 13.6  11.1* 10.2*  HCT 38.4* 31.7* 28.2*  MCV 93.2 92.2 91.9  PLT 191 136* 102*   Cardiac Enzymes: Recent Labs  Lab 12/26/2019 1832  CKTOTAL 392   Sepsis Labs: Recent Labs  Lab 01/02/2020 1832 01/01/2020 2202 12/28/19 0420 12/28/19 2312 01-21-2020 0242  WBC 28.5*  --  21.3*  --  14.8*  LATICACIDVEN 3.5* 2.8*  --  1.8 1.1    Procedures/Operations  None   Marvelene Stoneberg T Berkleigh Beckles 01/12/2020, 3:33 PM

## 2020-01-25 NOTE — Progress Notes (Signed)
20-Jan-2020 0958 Pt taken to morgue. Mark Graham

## 2020-01-25 NOTE — Progress Notes (Signed)
Patient's heart has stopped at 0458, Halina Andreas, RN and myself verified the patient, no breath viewed, no pulse felt and no heart sound heard. Provider on call notified. The first contact on file, Shelia Hipp was called with no success. Remer Macho, patient's brother and Jean Rosenthal were also called with no answers. [name], the brother called back to the unit and was informed about his brother's death. Patient's brother stated that he will contact his sister and get back at [place]. [facility] called and talk to Jackolyn Confer, the referral number is [id number]. Post mortem care provided.

## 2020-01-25 DEATH — deceased

## 2020-10-06 IMAGING — US US ABDOMEN LIMITED
1 series · 14 of 25 positions shown · non-contrast
Comparison: Ultrasound dated 01/23/2017.

CLINICAL DATA: 63-year-old male with abdominal pain.

EXAM:
ULTRASOUND ABDOMEN LIMITED RIGHT UPPER QUADRANT

[Series 1: us abdomen limited · 14 of 38 slices shown]
[im 1/38]
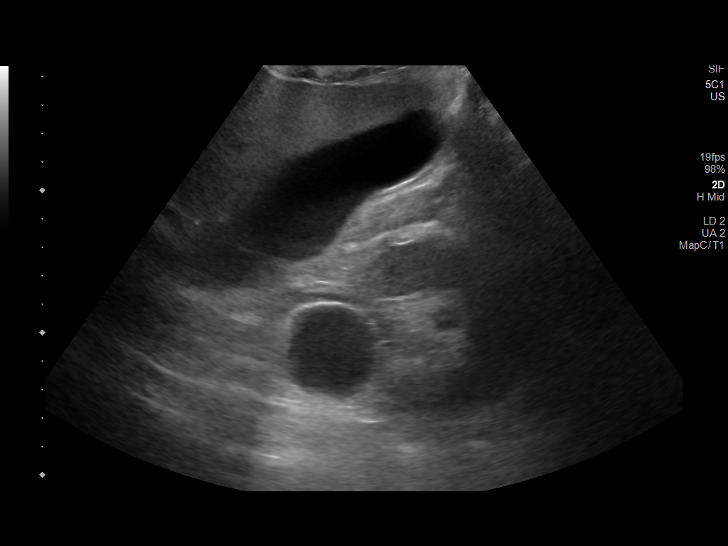
[im 4/38]
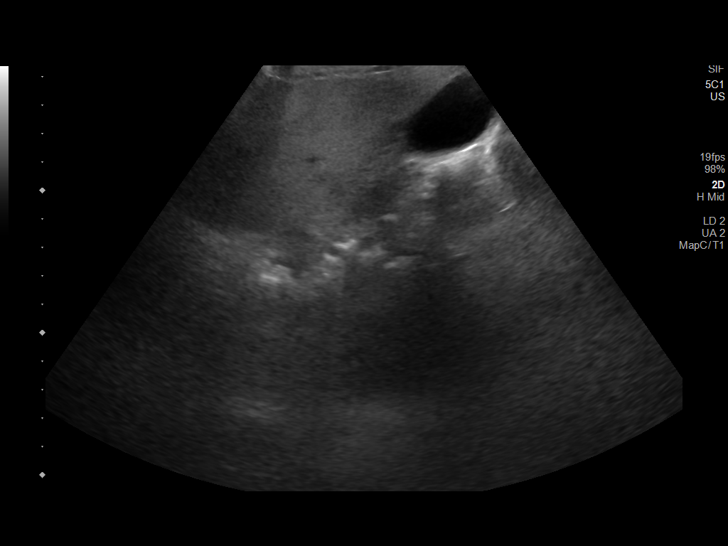
[im 7/38]
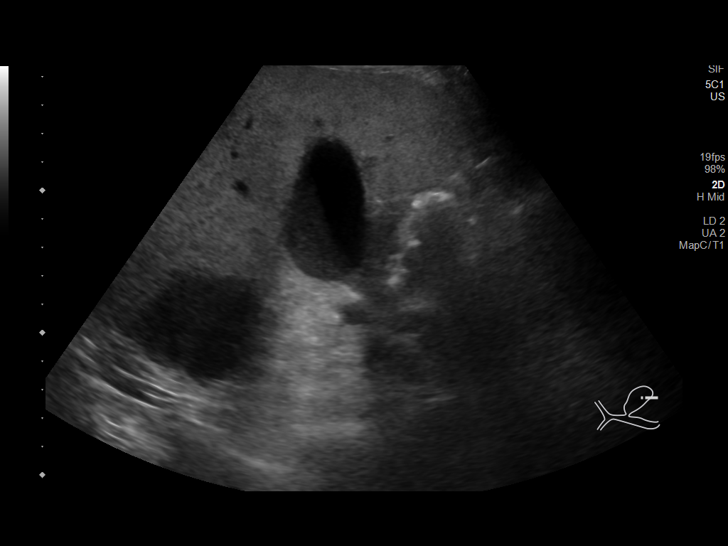
[im 10/38]
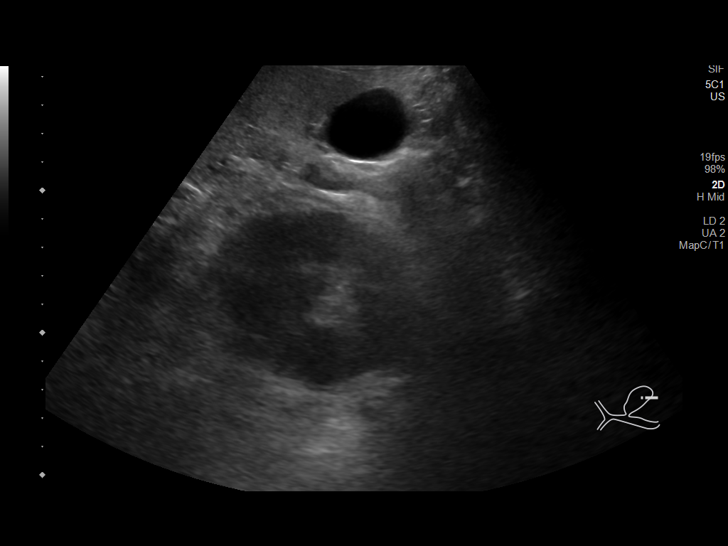
[im 13/38]
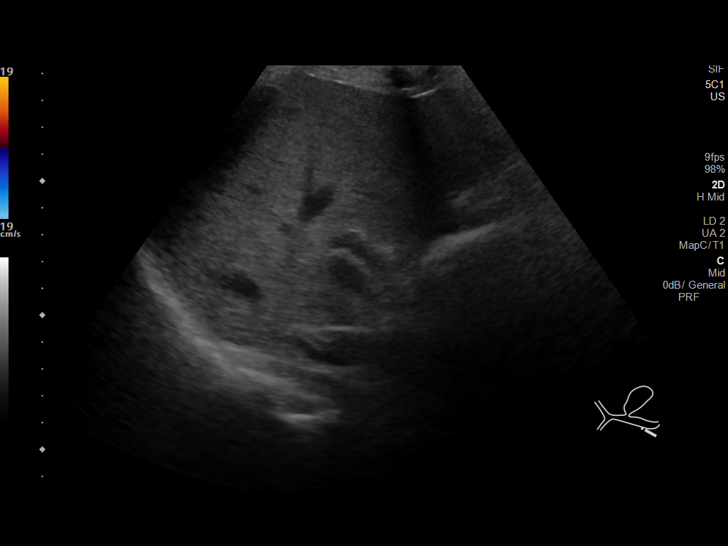
[im 14/38]
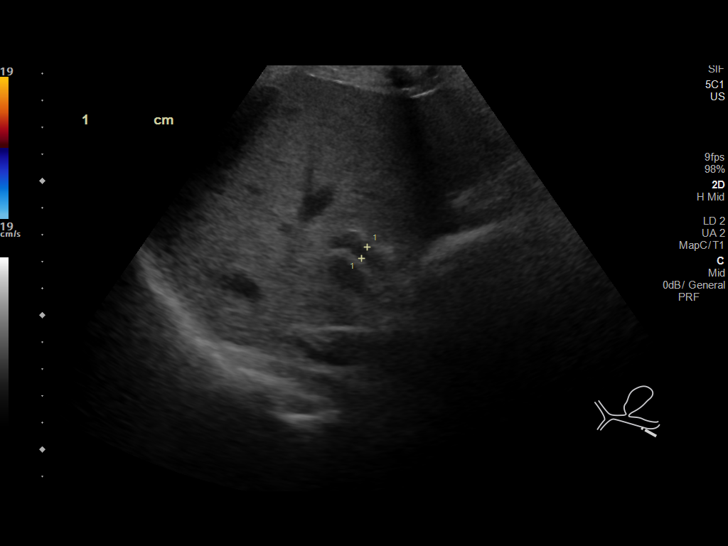
[im 17/38]
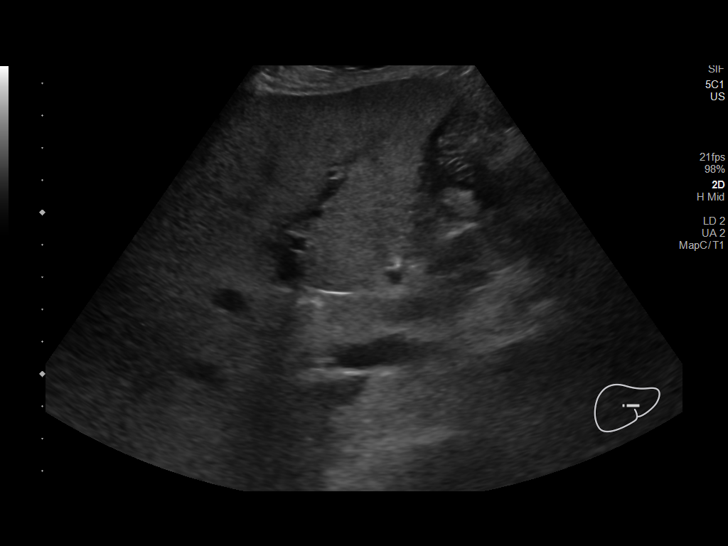
[im 21/38]
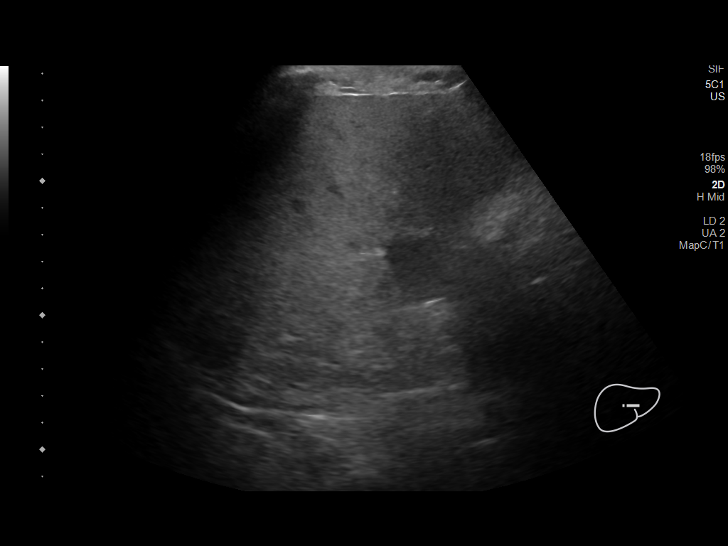
[im 24/38]
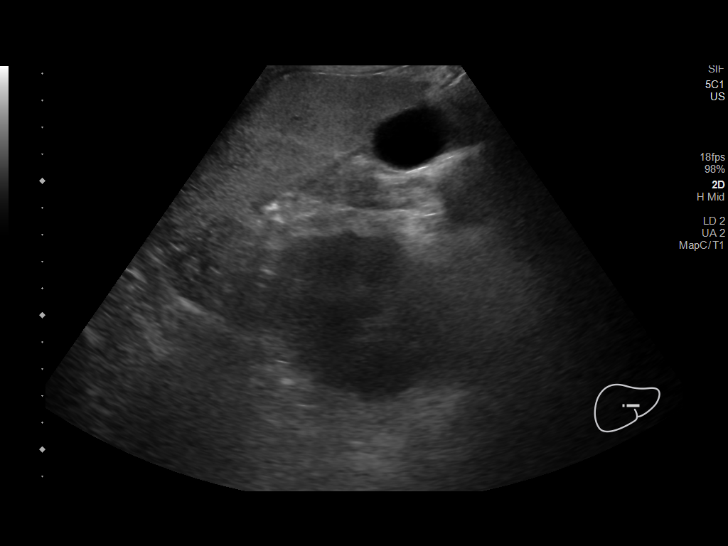
[im 25/38]
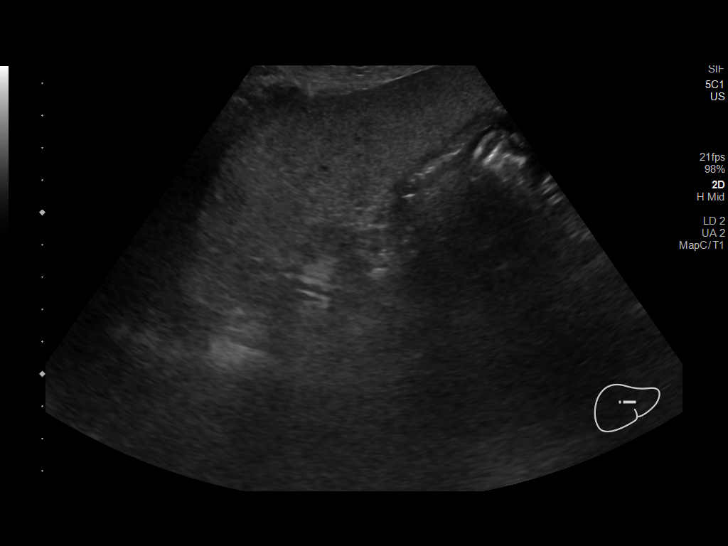
[im 28/38]
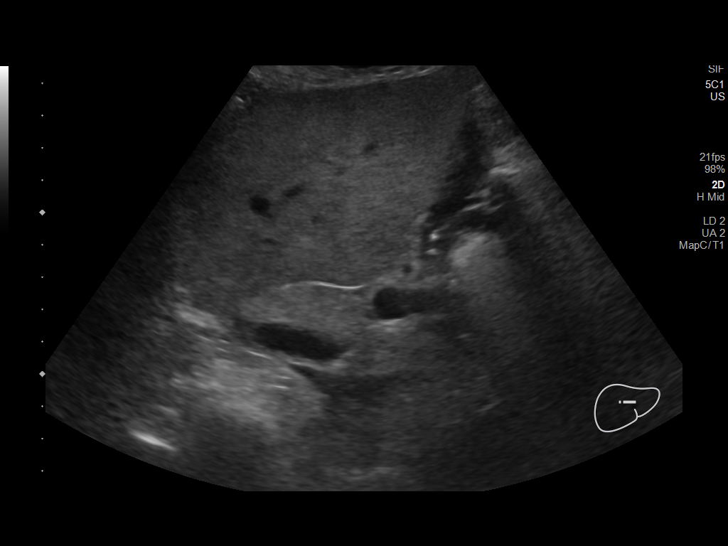
[im 31/38]
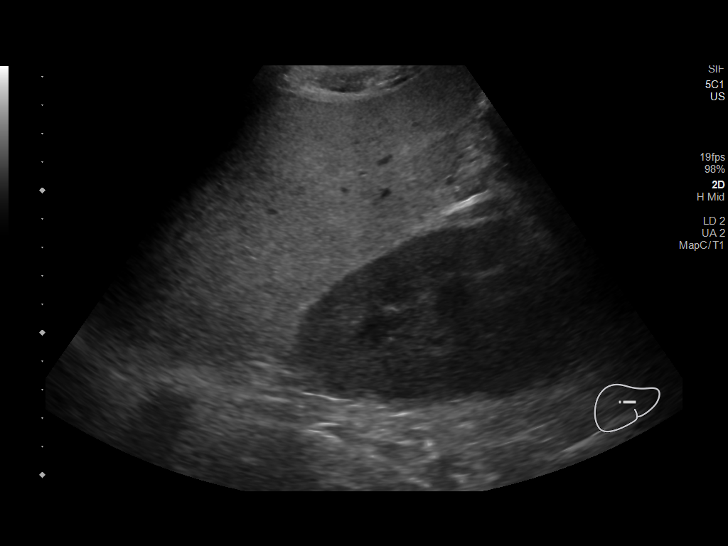
[im 34/38]
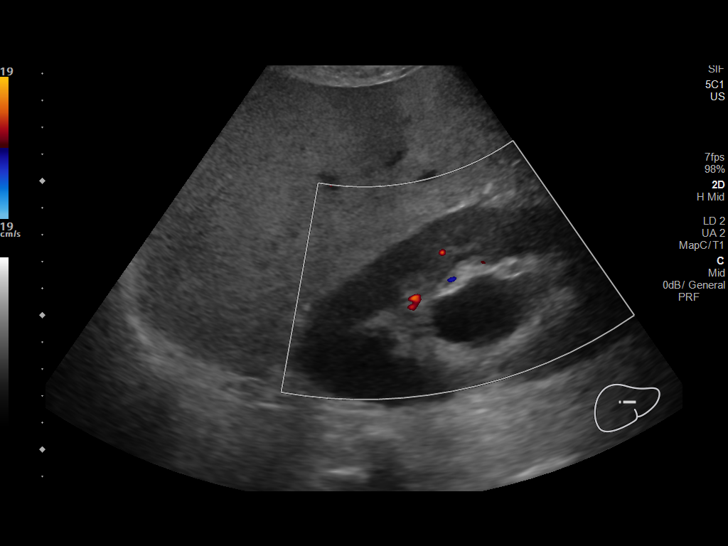
[im 38/38]
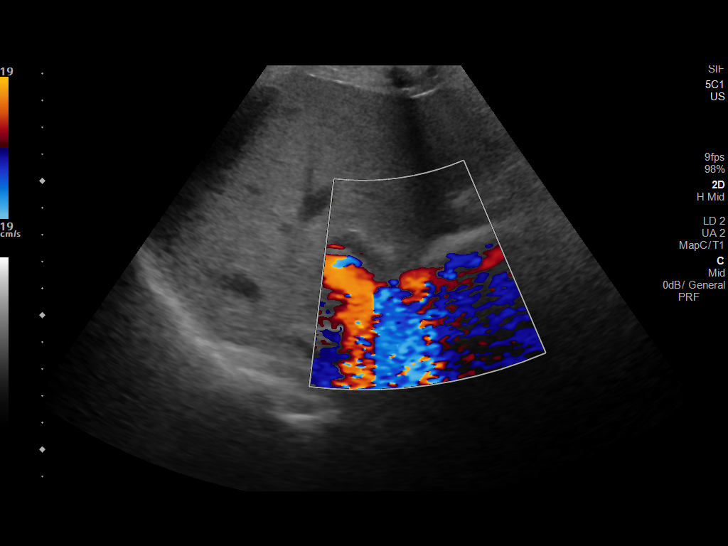

[14 of 25 positions shown; findings below may reference images not displayed]

FINDINGS: Gallbladder:

There is layering sludge within the gallbladder. No gallbladder wall
thickening or pericholecystic fluid. Negative sonographic Murphy's
sign.

Common bile duct:

Diameter: 5 mm

Liver:

There is diffuse increased liver echogenicity most commonly seen in
the setting of fatty infiltration. Superimposed inflammation or
fibrosis is not excluded. Clinical correlation is recommended.
Portal vein is patent on color Doppler imaging with normal direction
of blood flow towards the liver.

Other: There is a mild right hydronephrosis.
IMPRESSION: 1. Fatty liver.
2. Gallbladder sludge. No sonographic findings of acute
cholecystitis.
3. Mild right hydronephrosis.

## 2020-10-07 IMAGING — US US RENAL
1 series · 14 of 25 positions shown · non-contrast
Comparison: 01/23/2017.  A

CLINICAL DATA: Acute kidney injury.

EXAM:
RENAL / URINARY TRACT ULTRASOUND COMPLETE

[Series 1: us renal · 14 of 46 slices shown]
[im 1/46]
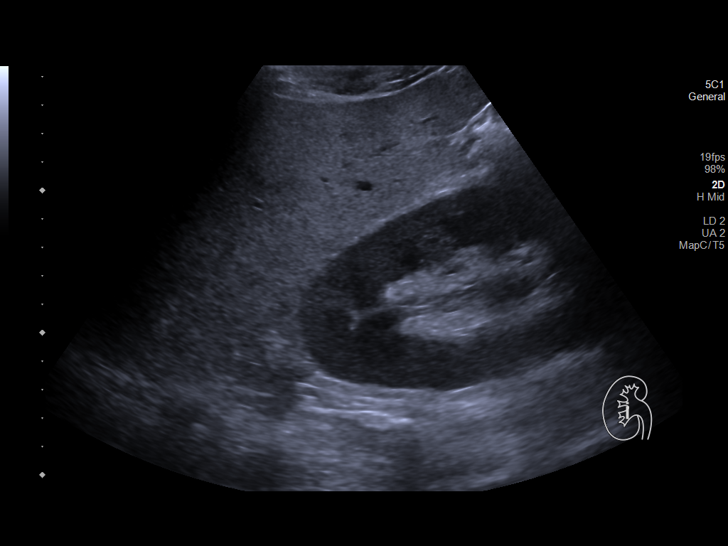
[im 4/46]
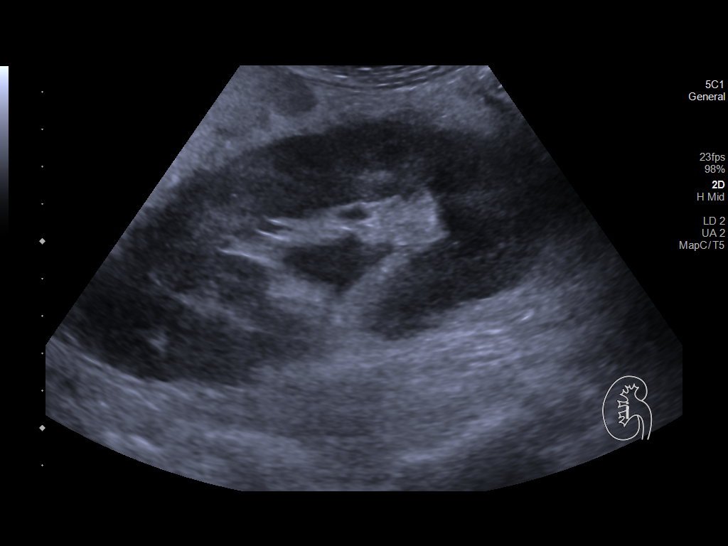
[im 8/46]
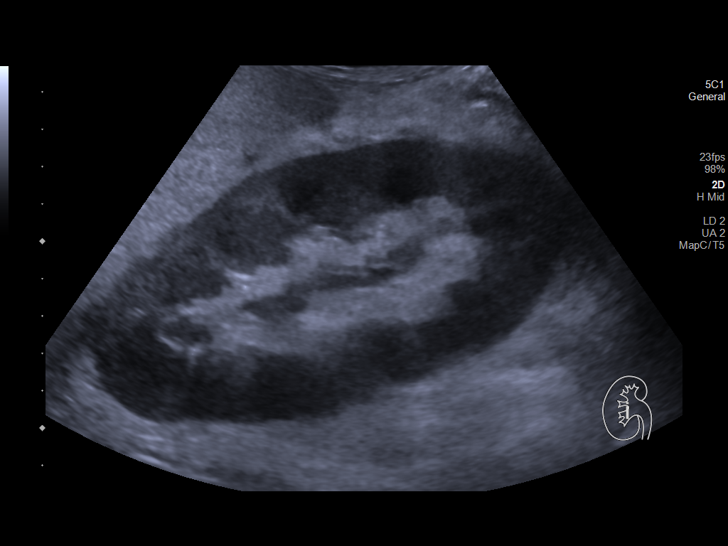
[im 12/46]
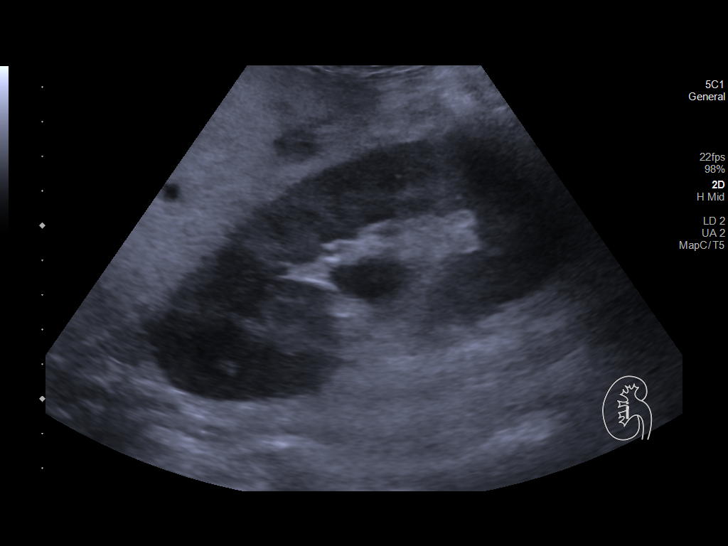
[im 16/46]
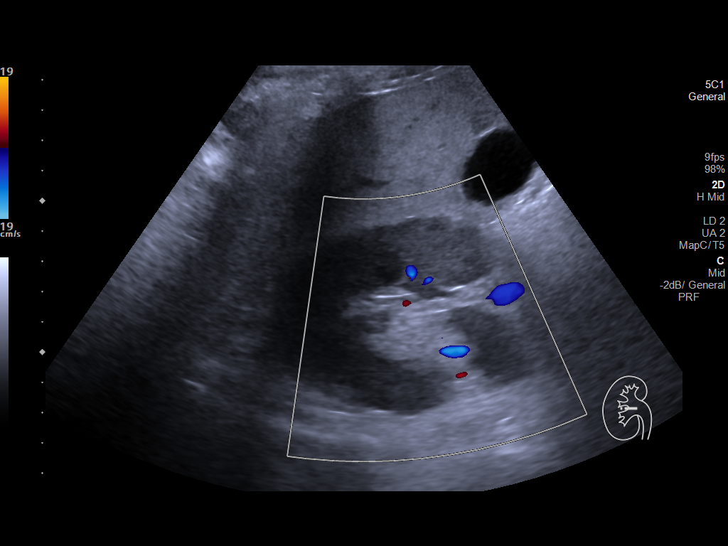
[im 17/46]
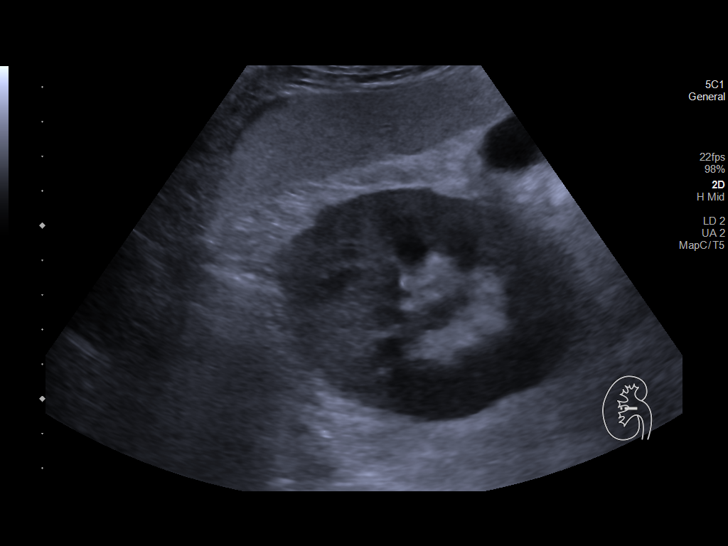
[im 21/46]
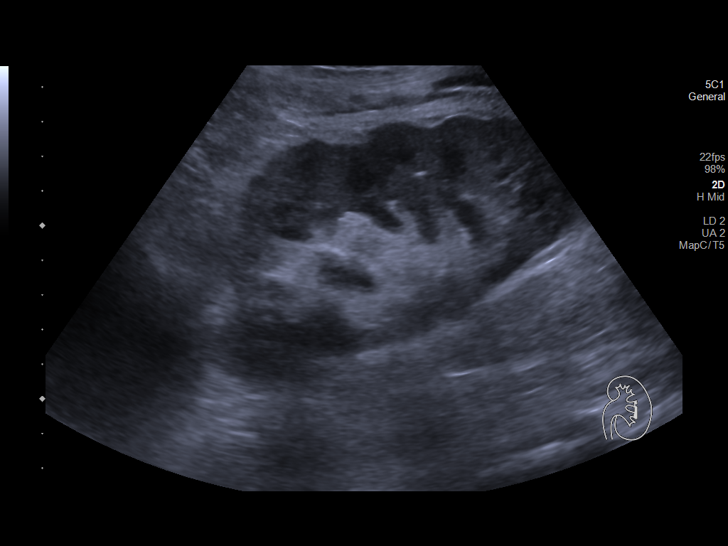
[im 25/46]
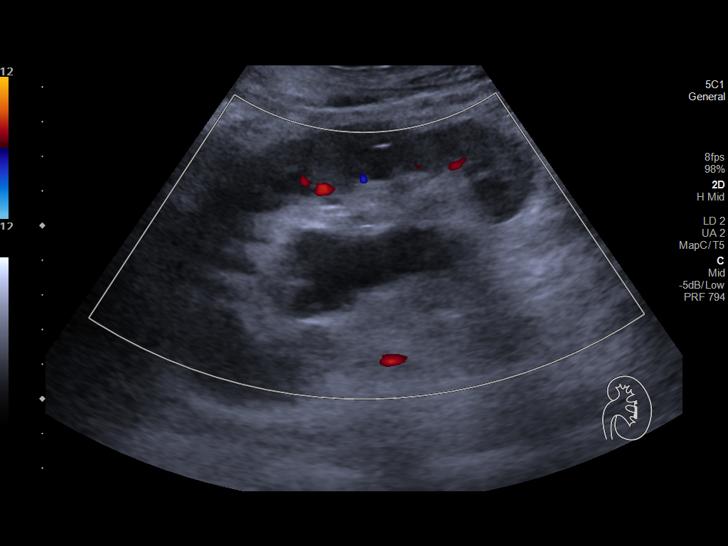
[im 29/46]
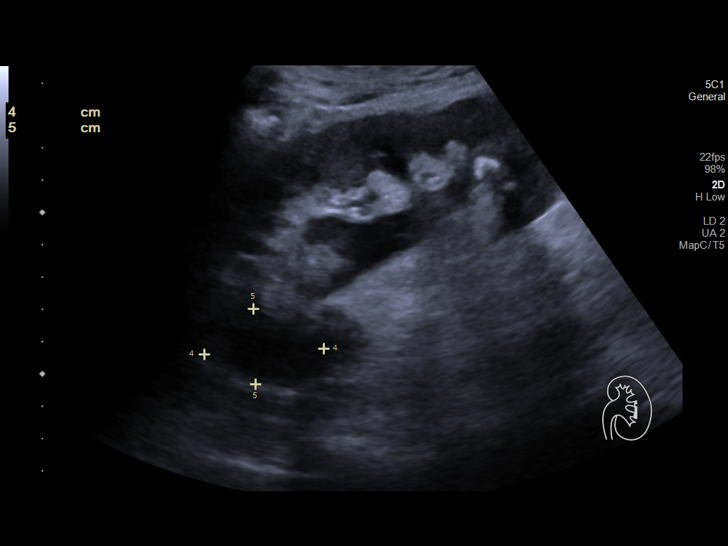
[im 31/46]
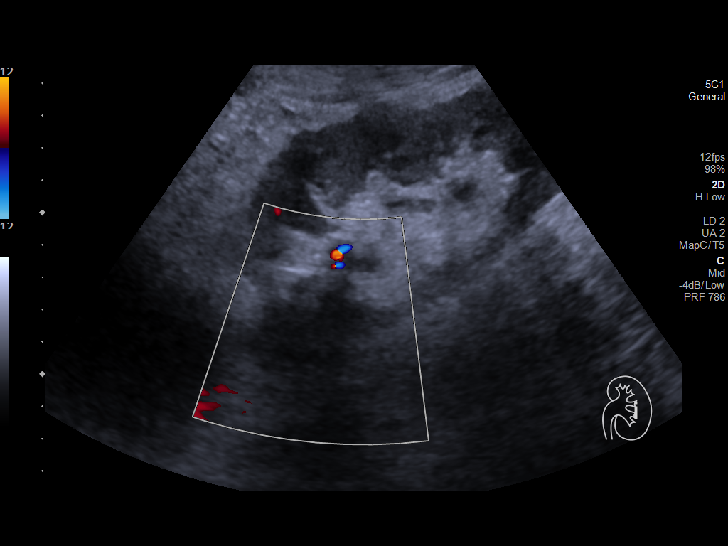
[im 34/46]
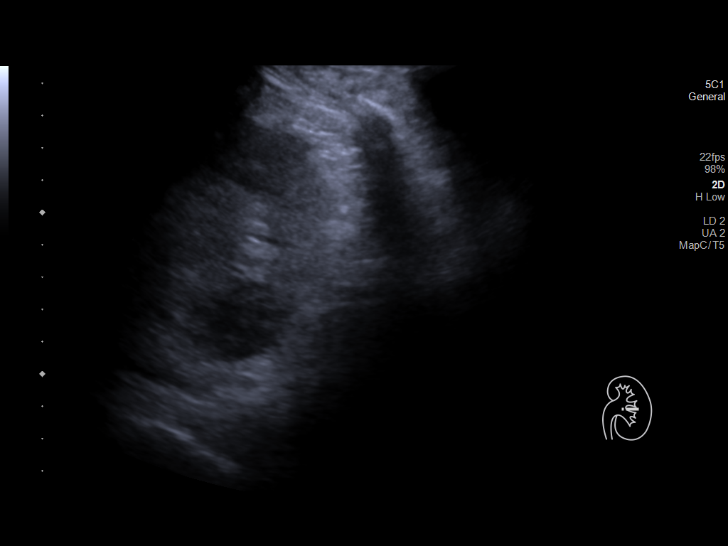
[im 38/46]
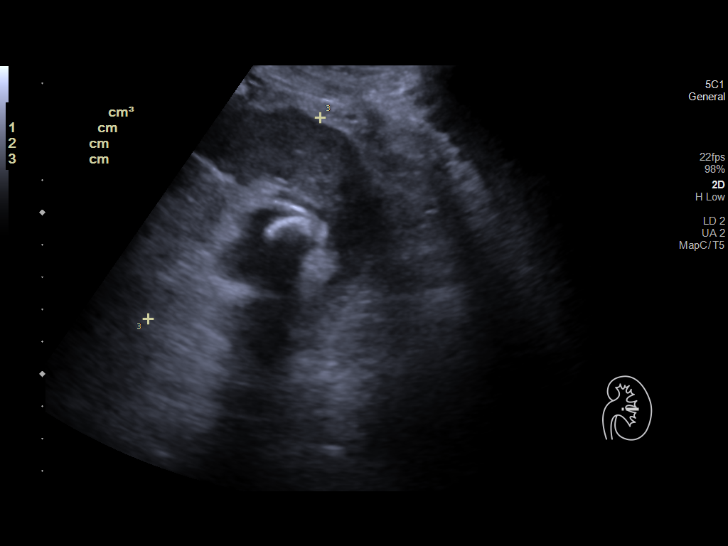
[im 42/46]
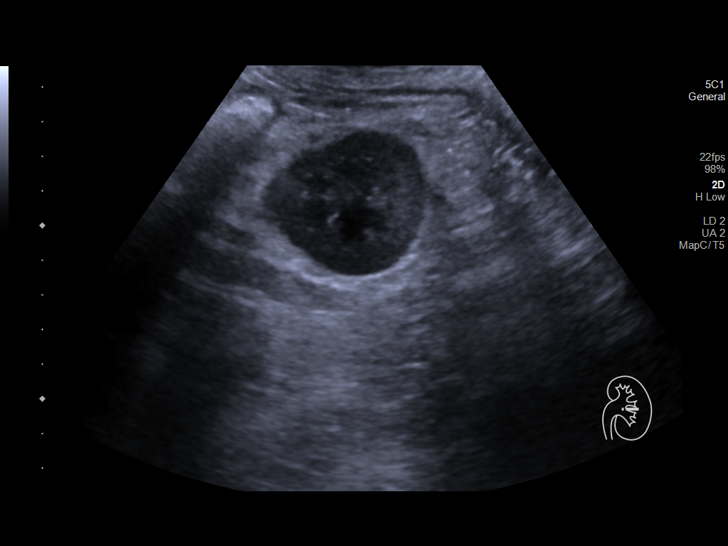
[im 46/46]
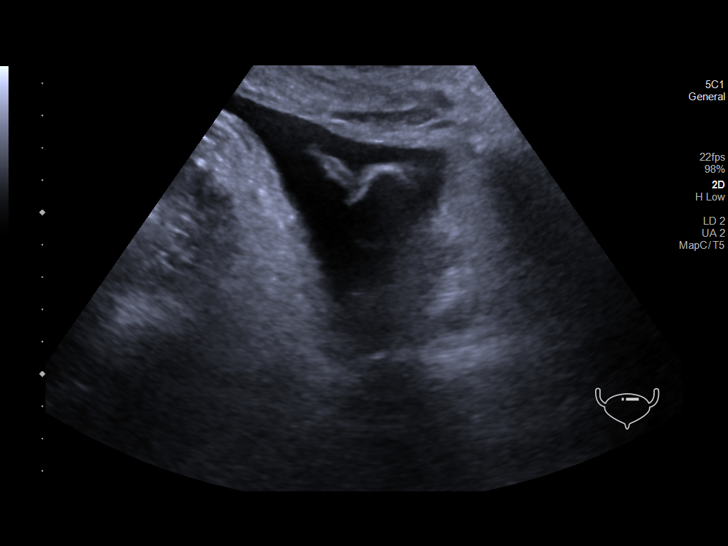

[14 of 25 positions shown; findings below may reference images not displayed]

FINDINGS: Right Kidney:

Renal measurements: 13.6 x 6.9 x 6.7 cm = volume: 329.2 mL. Mild
hydronephrosis. Normal echogenicity. No mass.

Left Kidney:

Renal measurements: 12.3 x 6.6 x 8.2 cm. = volume: 350.1 mL.
Moderate hydronephrosis. Hypoechoic structure arising from upper
pole of the right kidney measures 3.7 x 2.3 x 2.8 cm.

Bladder:

Appears normal for degree of bladder distention. Foley catheter in
place.

Other:

None.
IMPRESSION: 1. Mild right hydronephrosis and moderate left hydronephrosis.
2. Complex cyst within upper pole of right kidney is noted measuring
3.7 cm. Recommend follow-up nonemergent cross-sectional imaging,
either MRI or CT without and with contrast material for more
definitive characterization.
